# Patient Record
Sex: Female | Born: 1995
Health system: Southern US, Community
[De-identification: ages and names within clinical notes are randomized; demographics above are authoritative.]

## PROBLEM LIST (undated history)

## (undated) DIAGNOSIS — J45909 Unspecified asthma, uncomplicated: Secondary | ICD-10-CM

## (undated) DIAGNOSIS — G43109 Migraine with aura, not intractable, without status migrainosus: Secondary | ICD-10-CM

## (undated) DIAGNOSIS — R569 Unspecified convulsions: Secondary | ICD-10-CM

## (undated) HISTORY — DX: Migraine with aura, not intractable, without status migrainosus: G43.109

## (undated) HISTORY — PX: NO PAST SURGERIES: SHX2092

## (undated) HISTORY — DX: Unspecified convulsions: R56.9

---
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
## 2014-08-14 ENCOUNTER — Encounter: Payer: Self-pay | Admitting: Advanced Practice Midwife

## 2014-08-14 ENCOUNTER — Ambulatory Visit (INDEPENDENT_AMBULATORY_CARE_PROVIDER_SITE_OTHER): Payer: Medicaid Other | Admitting: Advanced Practice Midwife

## 2014-08-14 VITALS — BP 140/76 | Ht 64.0 in | Wt 169.0 lb

## 2014-08-14 DIAGNOSIS — Z3202 Encounter for pregnancy test, result negative: Secondary | ICD-10-CM

## 2014-08-14 DIAGNOSIS — Z3046 Encounter for surveillance of implantable subdermal contraceptive: Secondary | ICD-10-CM

## 2014-08-14 DIAGNOSIS — Z30017 Encounter for initial prescription of implantable subdermal contraceptive: Secondary | ICD-10-CM

## 2014-08-14 DIAGNOSIS — G43109 Migraine with aura, not intractable, without status migrainosus: Secondary | ICD-10-CM | POA: Insufficient documentation

## 2014-08-14 LAB — POCT URINE PREGNANCY: Preg Test, Ur: NEGATIVE

## 2014-08-14 NOTE — Progress Notes (Signed)
Patricia Horton 18 y.o. Filed Vitals:   08/14/14 1526  BP: 140/76    Past Medical History  Diagnosis Date  . Seizures   . Migraine with aura 2014    NO ESTROGEN    Family History  Problem Relation Age of Onset  . Heart disease Paternal Aunt   . Cancer Paternal Grandmother     breast  . Heart disease Paternal Grandmother   . Diabetes Mother   . Stroke Mother   . Hypertension Mother     History   Social History  . Marital Status: Single    Spouse Name: N/A    Number of Children: N/A  . Years of Education: N/A   Occupational History  . Not on file.   Social History Main Topics  . Smoking status: Never Smoker   . Smokeless tobacco: Never Used  . Alcohol Use: No  . Drug Use: No  . Sexual Activity: Yes    Birth Control/ Protection: Implant   Other Topics Concern  . Not on file   Social History Narrative  . No narrative on file    HPI:  Patricia Horton is here for Nexplanon removal and reinsertion.  She was given informed consent for removal of her Nexplanon and reinsertion of a new one.A signed copy is in the chart.  Appropriate time out taken. Nexlanon site identified and thea area was prepped in usual sterile fashon. 2 cc of 1% lidocaine was used to anesthetize the area starting with the distal end of the implant. A small stab incision was made right beside the implant on the distal portion.  The Nexplanon rod was grasped using hemostats and removed without difficulty.  There was less than 3 cc blood loss. There were no complications. Next, the area was cleansed again and the new Nexplanon was inserted without difficulty.  Steri strips and a pressure bandage were applied.  Pt was instructed to remove pressure bandage in a few hours, and keep insertion site covered with a bandaid for 3 days.  Follow-up scheduled PRN problems  CRESENZO-DISHMAN,Patricia Horton 08/14/2014 3:56 PM

## 2014-09-17 ENCOUNTER — Encounter (HOSPITAL_COMMUNITY): Payer: Self-pay | Admitting: Emergency Medicine

## 2014-09-17 ENCOUNTER — Emergency Department (HOSPITAL_COMMUNITY)
Admission: EM | Admit: 2014-09-17 | Discharge: 2014-09-17 | Disposition: A | Discharge status: Home / Self Care | Payer: No Typology Code available for payment source | Attending: Emergency Medicine | Admitting: Emergency Medicine

## 2014-09-17 DIAGNOSIS — Y9389 Activity, other specified: Secondary | ICD-10-CM | POA: Insufficient documentation

## 2014-09-17 DIAGNOSIS — Y92410 Unspecified street and highway as the place of occurrence of the external cause: Secondary | ICD-10-CM | POA: Insufficient documentation

## 2014-09-17 DIAGNOSIS — Z8679 Personal history of other diseases of the circulatory system: Secondary | ICD-10-CM | POA: Insufficient documentation

## 2014-09-17 DIAGNOSIS — S0090XA Unspecified superficial injury of unspecified part of head, initial encounter: Secondary | ICD-10-CM | POA: Insufficient documentation

## 2014-09-17 DIAGNOSIS — Z79899 Other long term (current) drug therapy: Secondary | ICD-10-CM | POA: Insufficient documentation

## 2014-09-17 DIAGNOSIS — S199XXA Unspecified injury of neck, initial encounter: Secondary | ICD-10-CM | POA: Insufficient documentation

## 2014-09-17 DIAGNOSIS — S3992XA Unspecified injury of lower back, initial encounter: Secondary | ICD-10-CM | POA: Diagnosis present

## 2014-09-17 DIAGNOSIS — R51 Headache: Secondary | ICD-10-CM

## 2014-09-17 DIAGNOSIS — S0993XA Unspecified injury of face, initial encounter: Secondary | ICD-10-CM | POA: Diagnosis not present

## 2014-09-17 DIAGNOSIS — S39012A Strain of muscle, fascia and tendon of lower back, initial encounter: Secondary | ICD-10-CM | POA: Insufficient documentation

## 2014-09-17 DIAGNOSIS — Z8669 Personal history of other diseases of the nervous system and sense organs: Secondary | ICD-10-CM | POA: Insufficient documentation

## 2014-09-17 DIAGNOSIS — R519 Headache, unspecified: Secondary | ICD-10-CM

## 2014-09-17 MED ORDER — CYCLOBENZAPRINE HCL 5 MG PO TABS: 5.0000 mg | ORAL_TABLET | Freq: Three times a day (TID) | ORAL | Status: DC | PRN

## 2014-09-17 MED ORDER — CYCLOBENZAPRINE HCL 10 MG PO TABS
10.0000 mg | ORAL_TABLET | Freq: Once | ORAL | Status: AC
  Administered 2014-09-17: 10 mg via ORAL
  Filled 2014-09-17: qty 1
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
## 2014-09-17 NOTE — Discharge Instructions (Signed)
Take the medication as directed. Do not take it if you are driving as it will make you sleepy. Apply ice to the areas, rest, return for worsening symptoms. Continue to take your Aleve.
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
## 2014-09-20 NOTE — ED Provider Notes (Signed)
Medical screening examination/treatment/procedure(s) were performed by non-physician practitioner and as supervising physician I was immediately available for consultation/collaboration.     Haven Foss, MD 09/20/14 0704 

## 2014-10-08 ENCOUNTER — Emergency Department (HOSPITAL_COMMUNITY)
Admission: EM | Admit: 2014-10-08 | Discharge: 2014-10-08 | Disposition: A | Discharge status: Home / Self Care | Payer: No Typology Code available for payment source | Attending: Emergency Medicine | Admitting: Emergency Medicine

## 2014-10-08 ENCOUNTER — Emergency Department (HOSPITAL_COMMUNITY): Payer: No Typology Code available for payment source

## 2014-10-08 ENCOUNTER — Encounter (HOSPITAL_COMMUNITY): Payer: Self-pay | Admitting: Emergency Medicine

## 2014-10-08 DIAGNOSIS — S3992XA Unspecified injury of lower back, initial encounter: Secondary | ICD-10-CM | POA: Insufficient documentation

## 2014-10-08 DIAGNOSIS — G479 Sleep disorder, unspecified: Secondary | ICD-10-CM | POA: Insufficient documentation

## 2014-10-08 DIAGNOSIS — Y9389 Activity, other specified: Secondary | ICD-10-CM | POA: Diagnosis not present

## 2014-10-08 DIAGNOSIS — Y998 Other external cause status: Secondary | ICD-10-CM | POA: Diagnosis not present

## 2014-10-08 DIAGNOSIS — Y9289 Other specified places as the place of occurrence of the external cause: Secondary | ICD-10-CM | POA: Diagnosis not present

## 2014-10-08 DIAGNOSIS — R200 Anesthesia of skin: Secondary | ICD-10-CM | POA: Diagnosis not present

## 2014-10-08 DIAGNOSIS — Y92410 Unspecified street and highway as the place of occurrence of the external cause: Secondary | ICD-10-CM | POA: Insufficient documentation

## 2014-10-08 DIAGNOSIS — Z8679 Personal history of other diseases of the circulatory system: Secondary | ICD-10-CM | POA: Insufficient documentation

## 2014-10-08 DIAGNOSIS — M545 Low back pain: Secondary | ICD-10-CM

## 2014-10-08 DIAGNOSIS — Z79899 Other long term (current) drug therapy: Secondary | ICD-10-CM | POA: Diagnosis not present

## 2014-10-08 DIAGNOSIS — Z7952 Long term (current) use of systemic steroids: Secondary | ICD-10-CM | POA: Insufficient documentation

## 2014-10-08 DIAGNOSIS — S8992XA Unspecified injury of left lower leg, initial encounter: Secondary | ICD-10-CM | POA: Insufficient documentation

## 2014-10-08 MED ORDER — PREDNISONE 10 MG PO TABS: ORAL_TABLET | ORAL | Status: DC

## 2014-10-08 MED ORDER — HYDROCODONE-ACETAMINOPHEN 5-325 MG PO TABS: 1.0000 | ORAL_TABLET | ORAL | Status: DC | PRN

## 2014-10-08 MED ORDER — PREDNISONE 50 MG PO TABS
60.0000 mg | ORAL_TABLET | Freq: Once | ORAL | Status: AC
  Administered 2014-10-08: 15:00:00 60 mg via ORAL
  Filled 2014-10-08 (×2): qty 1

## 2014-10-08 NOTE — Discharge Instructions (Signed)
Back Pain, Adult °Low back pain is very common. About 1 in 5 people have back pain. The cause of low back pain is rarely dangerous. The pain often gets better over time. About half of people with a sudden onset of back pain feel better in just 2 weeks. About 8 in 10 people feel better by 6 weeks.  °CAUSES °Some common causes of back pain include: °· Strain of the muscles or ligaments supporting the spine. °· Wear and tear (degeneration) of the spinal discs. °· Arthritis. °· Direct injury to the back. °DIAGNOSIS °Most of the time, the direct cause of low back pain is not known. However, back pain can be treated effectively even when the exact cause of the pain is unknown. Answering your caregiver's questions about your overall health and symptoms is one of the most accurate ways to make sure the cause of your pain is not dangerous. If your caregiver needs more information, he or she may order lab work or imaging tests (X-rays or MRIs). However, even if imaging tests show changes in your back, this usually does not require surgery. °HOME CARE INSTRUCTIONS °For many people, back pain returns. Since low back pain is rarely dangerous, it is often a condition that people can learn to manage on their own.  °· Remain active. It is stressful on the back to sit or stand in one place. Do not sit, drive, or stand in one place for more than 30 minutes at a time. Take short walks on level surfaces as soon as pain allows. Try to increase the length of time you walk each day. °· Do not stay in bed. Resting more than 1 or 2 days can delay your recovery. °· Do not avoid exercise or work. Your body is made to move. It is not dangerous to be active, even though your back may hurt. Your back will likely heal faster if you return to being active before your pain is gone. °· Pay attention to your body when you  bend and lift. Many people have less discomfort when lifting if they bend their knees, keep the load close to their bodies, and  avoid twisting. Often, the most comfortable positions are those that put less stress on your recovering back. °· Find a comfortable position to sleep. Use a firm mattress and lie on your side with your knees slightly bent. If you lie on your back, put a pillow under your knees. °· Only take over-the-counter or prescription medicines as directed by your caregiver. Over-the-counter medicines to reduce pain and inflammation are often the most helpful. Your caregiver may prescribe muscle relaxant drugs. These medicines help dull your pain so you can more quickly return to your normal activities and healthy exercise. °· Put ice on the injured area. °¨ Put ice in a plastic bag. °¨ Place a towel between your skin and the bag. °¨ Leave the ice on for 15-20 minutes, 03-04 times a day for the first 2 to 3 days. After that, ice and heat may be alternated to reduce pain and spasms. °· Ask your caregiver about trying back exercises and gentle massage. This may be of some benefit. °· Avoid feeling anxious or stressed. Stress increases muscle tension and can worsen back pain. It is important to recognize when you are anxious or stressed and learn ways to manage it. Exercise is a great option. °SEEK MEDICAL CARE IF: °· You have pain that is not relieved with rest or medicine. °· You have pain that does not improve in 1 week. °· You have new symptoms. °· You are generally not feeling well. °SEEK   IMMEDIATE MEDICAL CARE IF:   You have pain that radiates from your back into your legs.  You develop new bowel or bladder control problems.  You have unusual weakness or numbness in your arms or legs.  You develop nausea or vomiting.  You develop abdominal pain.  You feel faint. Document Released: 11/16/2005 Document Revised: 05/17/2012 Document Reviewed: 03/20/2014 [facility] Patient Information 2015 [place], [place]. This information is not intended to replace advice given to you by your health care provider. Make sure you  discuss any questions you have with your health care provider.   Emergency Department Resource Guide 1) Find a Doctor and Pay Out of Pocket Although you won't have to find out who is covered by your insurance plan, it is a good idea to ask around and get recommendations. You will then need to call the office and see if the doctor you have chosen will accept you as a new patient and what types of options they offer for patients who are self-pay. Some doctors offer discounts or will set up payment plans for their patients who do not have insurance, but you will need to ask so you aren't surprised when you get to your appointment.  2) Contact Your Local Health Department Not all health departments have doctors that can see patients for sick visits, but many do, so it is worth a call to see if yours does. If you don't know where your local health department is, you can check in your phone book. The CDC also has a tool to help you locate your state's health department, and many state websites also have listings of all of their local health departments.  3) Find a [facility] If your illness is not likely to be very severe or complicated, you may want to try a walk in clinic. These are popping up all over the country in pharmacies, drugstores, and shopping centers. They're usually staffed by nurse practitioners or physician assistants that have been trained to treat common illnesses and complaints. They're usually fairly quick and inexpensive. However, if you have serious medical issues or chronic medical problems, these are probably not your best option.  No Primary Care Doctor: - Call Health Connect at  [phone] - they can help you locate a primary care doctor that  accepts your insurance, provides certain services, etc. - Physician Referral Service- [phone]  Chronic Pain Problems: Organization         Address  Phone   Notes  [facility]  [phone] Patients need to  be referred by their primary care doctor.   Medication Assistance: Organization         Address  Phone   Notes  [facility] Medication [facility] [address] [place], [place] [address] [phone] --Must be a resident of [facility] -- Must have NO insurance coverage whatsoever (no Medicaid/ Medicare, etc.) -- The pt. MUST have a primary care doctor that directs their care regularly and follows them in the community   MedAssist  [phone]   [employer]  [phone]    Agencies that provide inexpensive medical care: Organization         Address  Phone   Notes  [place]  [phone]   [facility]    [phone]   [facility] [place], [place] [address] [phone]   [place] [address]  [place] [phone]   Planned Parenthood    [phone]   [facility]    [phone]   Community Health and [place] [address], [place] Phone:  [phone], Fax:  [phone] Hours of Operation:  9 am - 6 pm, M-F.  Also accepts Medicaid/Medicare and self-pay.  [facility] for [address], [address], [place] Phone: [phone], Fax: [phone]. Hours of Operation:  8:30 am - 5:30 pm, M-F.  Also accepts Medicaid and self-pay.  [facility] [place] [address], [place] Phone: [phone]   [place], [place], [place] [phone], Ext. [phone] Mondays & Thursdays: 7-9 AM.  First 15 patients are seen on a first come, first serve basis.    [place] Providers:  Organization         Address  Phone   Notes  [facility] [address], [place] [phone] Also accepts self-pay patients.  [facility] [address], [place]  [phone]   [place], [address], [place] [phone]   [facility] [address] [place] [phone]   Lucianne Lei [address], [place]   [phone] Only accepts [place] Access [place] patients after they have their name applied to their card.   Self-Pay (no insurance) in [facility]:  Organization         Address  Phone   Notes  Sickle Cell Patients, [facility] [place] [phone]   [facility] [place] [phone]   [facility] [place]  [place], [place], [place] [phone]   [facility]/Dr. Osei-Bonsu  [address], [place] or [address], [place] [phone] Phone number for both [place] and [place] locations is the same.  [facility] [address], [place] [phone]   [facility] [address] [place] or [address] [phone]   [facility] [address] [place] [phone], phone; [phone], fax Sees patients 1st and 3rd Saturday of every month.  Must not qualify for public or private insurance (i.e. Medicaid, Medicare, [place] Health Choice, Veterans' Benefits)  Household income should be no more than 200% of the poverty level The clinic cannot treat you if you are pregnant or think you are pregnant  Sexually transmitted diseases are not treated at the clinic.    Dental Care: Organization         Address  Phone  Notes  [facility] Department of [facility] [place] [phone] Accepts children up to age 110 who are enrolled in [place] or [place]; pregnant women with a Medicaid card; and children who have applied for Medicaid or [place] Health Choice, but were declined, whose parents can  pay a reduced fee at time of service.  [facility]  [address], [place] [phone] Accepts children up to age 35 who are enrolled in [place] or [place]; pregnant women with a Medicaid card; and children who have applied for Medicaid or [place]  Health Choice, but were declined, whose parents can pay a reduced fee at time of service.  [place] Adult Dental Access PROGRAM  [place] [phone] Patients are seen by appointment only. Walk-ins are not accepted. [place] will see patients 54 years of age and older. Monday - Tuesday (8am-5pm) Most Wednesdays (8:30-5pm) $30 per visit, cash only  [facility] Adult Dental Access PROGRAM  [address], [facility] [phone] Patients are seen by appointment only. Walk-ins are not accepted. [place] will see patients 74 years of age and older. One Wednesday Evening (Monthly: Volunteer Based).  $30 per visit, cash only  [place]  [phone] for adults; Children under age 11, call Graduate Pediatric Dentistry at [phone]. Children aged 13-14, please call [phone] to request a pediatric application.  Dental services are provided in all areas of dental care including fillings, crowns and bridges, complete and partial dentures, implants, gum treatment, root canals, and extractions. Preventive care is also provided. Treatment is provided to both adults and children. Patients are selected via a lottery and there is often a waiting list.   [place] [address], [place]  [phone] [email or web address]   [facility] [address] [place], [place] [phone], Ext. [phone] Second and Fourth Thursday of each month, opens at 6:30 AM; Clinic ends at 9 AM.  Patients are seen on a first-come first-served basis, and a limited number are seen during each clinic.   [facility]  [address] Hillard Danker [place], [place] [phone]   Eligibility Requirements You must have lived in [place], [place], or [place] counties for at least the last three months.   You cannot be eligible for state or federal sponsored [employer], including [employer], [place], or [employer].   You generally cannot be eligible for healthcare insurance through your employer.    How to apply: Eligibility screenings are held every Tuesday and Wednesday afternoon from 1:00 pm until 4:00 pm. You do not need an appointment for the interview!  [facility] [address], [place], [place]   [place]  [place] Department  [place]  [phone]    Behavioral Health Resources in the Community: Intensive Outpatient Programs Organization         Address  Phone  Notes  [facility] [place] [place]. [address] [place], [place] [phone]   [facility] Outpatient [address] [place], [place]   ADS: Alcohol & Drug Svcs [address], [place], [place]   [place] [address]  [place], [place] or [phone]   Substance Abuse Resources Organization         Address  Phone  Notes  Alcohol [facility] Drug Services  [phone]   [place]  [phone]   [facility] [place]   Chinita Pester  [phone]   Residential & Outpatient Substance Abuse Program  [phone]   Psychological Services Organization         Address  Phone  Notes  [facility] [place]  [place]  [phone]   [place] [address] [place] or [phone]    Mobile Crisis Teams Organization         Address  Phone  Notes  [facility], Mobile Crisis Care Unit  [phone]    [facility]  [address] [place], [place] [phone]   [facility] [address] [place] [phone]    Self-Help/Support Groups Organization         Address  Phone             Notes  [facility] of [place] - variety of support groups  [phone]- [phone] Call for more information  Narcotics Anonymous (NA), [facility] [address], [place]  2 meetings at this location   Statisticianesidential Treatment Programs Organization         Address  Phone  Notes  [facility] [address],    [place]  [phone]   [facility]  [address], [place] [phone], [place], [place] [phone]   [facility] [address], [place] [phone] Admissions: 8am-3pm M-F  [facility] [address]    [place], [place] [phone]   [facility] [address], [place], [place] [phone]   [facility] [address]  [place], [place] [phone]   [facility] - Intensive Outpatient [address], [place], [place] [phone]   [facility] ([facility] [address]  [place], [place] [phone] or [phone]   [facility] (RTS) [address] [place], [place] [phone] Accepts Medicaid  [facility] [address]  [place] [phone] Substance Abuse/Addiction Treatment   [facility] Behavioral Health Resources Organization         Address  Phone  Notes  [facility]  [phone](888) [phone]   Angie FavaJulie Brannon, PhD [address], [address] [place], [place]   [phone](336) [phone] or [phone]   [facility]   [address] [place], [place] [phone]   [facility] [address], [place], [place] [phone]) [phone] Insurance/Medicaid/sponsorship through [facility]  [facility] [address]                                     [place], [place] [phone]) [phone] Therapy/tele-psych/case  [facility] [address].   [place], [place] [phone]) [phone]    Dr. Lolly MustacheArfeen  [phone]   [facility] [place]  [employer] [facility] 1) [address], [place] 2) [address], [place] 3)  [address], [place] [phone]) [phone]  [phone]([phone]   [facility] Child Abuse Hotline [phone](336) [phone] or [phone](336) 7782150142 (After Hours)        Take your next dose of prednisone tomorrow morning.  Use the the other medicines as directed.  Do not drive within 4 hours of taking oxycodone as this will make you drowsy.  Avoid lifting,  Bending,  Twisting or any other activity that worsens your pain over the next week.  Apply an  icepack  to your lower back for 10-15 minutes every 2 hours for the next 2 days.  You should get rechecked if your symptoms are not better over the next 5 days,  Or you develop increased pain,  Weakness in your leg(s) or loss of bladder or bowel function - these are symptoms of a worse injury.
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
## 2015-05-13 ENCOUNTER — Emergency Department (HOSPITAL_COMMUNITY)
Admission: EM | Admit: 2015-05-13 | Discharge: 2015-05-13 | Disposition: A | Discharge status: Home / Self Care | Payer: Self-pay | Attending: Emergency Medicine | Admitting: Emergency Medicine

## 2015-05-13 ENCOUNTER — Encounter (HOSPITAL_COMMUNITY): Payer: Self-pay | Admitting: *Deleted

## 2015-05-13 DIAGNOSIS — Z3202 Encounter for pregnancy test, result negative: Secondary | ICD-10-CM | POA: Insufficient documentation

## 2015-05-13 DIAGNOSIS — N760 Acute vaginitis: Secondary | ICD-10-CM | POA: Insufficient documentation

## 2015-05-13 DIAGNOSIS — Z79899 Other long term (current) drug therapy: Secondary | ICD-10-CM | POA: Insufficient documentation

## 2015-05-13 DIAGNOSIS — G43909 Migraine, unspecified, not intractable, without status migrainosus: Secondary | ICD-10-CM | POA: Insufficient documentation

## 2015-05-13 DIAGNOSIS — B9689 Other specified bacterial agents as the cause of diseases classified elsewhere: Secondary | ICD-10-CM

## 2015-05-13 DIAGNOSIS — N730 Acute parametritis and pelvic cellulitis: Secondary | ICD-10-CM | POA: Insufficient documentation

## 2015-05-13 LAB — PREGNANCY, URINE: PREG TEST UR: NEGATIVE

## 2015-05-13 LAB — URINE MICROSCOPIC-ADD ON

## 2015-05-13 LAB — WET PREP, GENITAL
Trich, Wet Prep: NONE SEEN
YEAST WET PREP: NONE SEEN

## 2015-05-13 LAB — URINALYSIS, ROUTINE W REFLEX MICROSCOPIC
Bilirubin Urine: NEGATIVE
Glucose, UA: NEGATIVE mg/dL
Hgb urine dipstick: NEGATIVE
KETONES UR: NEGATIVE mg/dL
Nitrite: NEGATIVE
PROTEIN: NEGATIVE mg/dL
Specific Gravity, Urine: 1.025 (ref 1.005–1.030)
UROBILINOGEN UA: 0.2 mg/dL (ref 0.0–1.0)
pH: 6.5 (ref 5.0–8.0)

## 2015-05-13 MED ORDER — IBUPROFEN 800 MG PO TABS
800.0000 mg | ORAL_TABLET | Freq: Once | ORAL | Status: AC
  Administered 2015-05-13: 800 mg via ORAL
  Filled 2015-05-13: qty 1

## 2015-05-13 MED ORDER — CEFTRIAXONE SODIUM 250 MG IJ SOLR
250.0000 mg | Freq: Once | INTRAMUSCULAR | Status: AC
  Administered 2015-05-13: 250 mg via INTRAMUSCULAR
  Filled 2015-05-13: qty 250

## 2015-05-13 MED ORDER — IBUPROFEN 800 MG PO TABS: 800.0000 mg | ORAL_TABLET | Freq: Three times a day (TID) | ORAL | Status: DC | PRN

## 2015-05-13 MED ORDER — LIDOCAINE HCL (PF) 1 % IJ SOLN
INTRAMUSCULAR | Status: AC
  Administered 2015-05-13: 2.1 mL
  Filled 2015-05-13: qty 5

## 2015-05-13 MED ORDER — METRONIDAZOLE 500 MG PO TABS: 500.0000 mg | ORAL_TABLET | Freq: Two times a day (BID) | ORAL | Status: DC

## 2015-05-13 MED ORDER — DOXYCYCLINE HYCLATE 100 MG PO CAPS: 100.0000 mg | ORAL_CAPSULE | Freq: Two times a day (BID) | ORAL | Status: DC

## 2015-05-13 MED ORDER — DOXYCYCLINE HYCLATE 100 MG PO TABS
100.0000 mg | ORAL_TABLET | Freq: Once | ORAL | Status: AC
  Administered 2015-05-13: 100 mg via ORAL
  Filled 2015-05-13: qty 1

## 2015-05-13 MED ORDER — METRONIDAZOLE 500 MG PO TABS
500.0000 mg | ORAL_TABLET | Freq: Once | ORAL | Status: AC
  Administered 2015-05-13: 500 mg via ORAL
  Filled 2015-05-13: qty 1

## 2015-05-13 NOTE — ED Provider Notes (Signed)
TIME SEEN: 7:45 PM  CHIEF COMPLAINT: Lower abdominal pain, vaginal discharge  HPI: Pt is a 19 y.o. female with history of migraines, prior history of seizures on antiepileptics who presents to the emergency department with complaints of 3 days of lower abdominal pain that is crampy, moderate in nature without radiation. Pain worse with movement and better with staying still. Denies fevers, chills, nausea, vomiting or diarrhea. Has noted yellow thick vaginal discharge without odor. His sexual active with one female partner. Denies history of previous pregnancies or STDs. Has had an Implanon placed in August 2015. States that over the past 2 months she has had periods that have been lasting 1-2 weeks and have been more painful than normal. She states her last menstrual period ended yesterday and lasted one week.  ROS: See HPI Constitutional: no fever  Eyes: no drainage  ENT: no runny nose   Cardiovascular:  no chest pain  Resp: no SOB  GI: no vomiting GU: no dysuria Integumentary: no rash  Allergy: no hives  Musculoskeletal: no leg swelling  Neurological: no slurred speech ROS otherwise negative  PAST MEDICAL HISTORY/PAST SURGICAL HISTORY:  Past Medical History  Diagnosis Date  . Seizures   . Migraine with aura 2014    NO ESTROGEN    MEDICATIONS:  Prior to Admission medications   Medication Sig Start Date End Date Taking? Authorizing Provider  cyclobenzaprine (FLEXERIL) 5 MG tablet Take 1 tablet (5 mg total) by mouth 3 (three) times daily as needed for muscle spasms. 09/17/14   Hope Orlene Och, NP  etonogestrel (NEXPLANON) 68 MG IMPL implant Inject 1 each into the skin once.    Historical Provider, MD  HYDROcodone-acetaminophen (NORCO/VICODIN) 5-325 MG per tablet Take 1 tablet by mouth every 4 (four) hours as needed. 10/08/14   Burgess Amor, PA-C  ondansetron (ZOFRAN-ODT) 4 MG disintegrating tablet Take 1 tablet (4 mg total) by mouth every 8 (eight) hours as needed for nausea. 12/22/13   Felicie Morn, NP  predniSONE (DELTASONE) 10 MG tablet 6, 5, 4, 3, 2 then 1 tablet by mouth daily for 6 days total. 10/08/14   Burgess Amor, PA-C    ALLERGIES:  No Known Allergies  SOCIAL HISTORY:  History  Substance Use Topics  . Smoking status: Never Smoker   . Smokeless tobacco: Never Used  . Alcohol Use: No    FAMILY HISTORY: Family History  Problem Relation Age of Onset  . Heart disease Paternal Aunt   . Cancer Paternal Grandmother     breast  . Heart disease Paternal Grandmother   . Diabetes Mother   . Stroke Mother   . Hypertension Mother     EXAM: BP 124/58 mmHg  Pulse 71  Temp(Src) 98.5 F (36.9 C) (Oral)  Resp 24  Ht 5\' 4"  (1.626 m)  Wt 157 lb 8 oz (71.442 kg)  BMI 27.02 kg/m2  SpO2 100%  LMP 05/05/2015 CONSTITUTIONAL: Alert and oriented and responds appropriately to questions. Well-appearing; well-nourished HEAD: Normocephalic EYES: Conjunctivae clear, PERRL ENT: normal nose; no rhinorrhea; moist mucous membranes; pharynx without lesions noted NECK: Supple, no meningismus, no LAD  CARD: RRR; S1 and S2 appreciated; no murmurs, no clicks, no rubs, no gallops RESP: Normal chest excursion without splinting or tachypnea; breath sounds clear and equal bilaterally; no wheezes, no rhonchi, no rales, no hypoxia or respiratory distress, speaking full sentences ABD/GI: Normal bowel sounds; non-distended; soft, tender throughout the lower abdomen, no rebound, no guarding, no peritoneal signs, no tenderness at McBurney's point  GU:  Patient has a large amount of thick yellow vaginal discharge coming from the cervical os with a friable cervix, she has cervical motion tenderness but no adnexal tenderness or fullness, normal external genitalia, no vaginal bleeding BACK:  The back appears normal and is non-tender to palpation, there is no CVA tenderness EXT: Normal ROM in all joints; non-tender to palpation; no edema; normal capillary refill; no cyanosis, no calf tenderness or swelling     SKIN: Normal color for age and race; warm NEURO: Moves all extremities equally, sensation to light touch intact diffusely, cranial nerves II through XII intact PSYCH: The patient's mood and manner are appropriate. Grooming and personal hygiene are appropriate.  MEDICAL DECISION MAKING: Patient here with cervical motion tenderness and a large amount of thick yellow vaginal discharge. Concern for PID. Urine shows no obvious sign of infection in urine pregnancy is negative. STD screening is pending. Will discharge on doxycycline. She received a dose of ceftriaxone in the ED.  ED PROGRESS: Patient's wet prep is positive for clue cells and WBCs.  Will discharge on Flagyl and with doxycycline. Recommended close outpatient follow-up with her OB/GYN. Discussed return precautions. She verbalizes understanding and is comfortable with plan. STD screening pending.     Layla Maw Ward, DO 05/13/15 2050

## 2015-05-13 NOTE — ED Notes (Signed)
Patient with no complaints at this time. Respirations even and unlabored. Skin warm/dry. Discharge instructions reviewed with patient at this time. Patient given opportunity to voice concerns/ask questions. Patient discharged at this time and left Emergency Department with steady gait.   

## 2015-05-13 NOTE — ED Notes (Signed)
Patient reports pelvic pain and yellow vaginal discharge.  Denies dysuria, burning or stinging w/urination.

## 2015-05-13 NOTE — Discharge Instructions (Signed)
Pelvic Inflammatory Disease °Pelvic inflammatory disease (PID) refers to an infection in some or all of the female organs. The infection can be in the uterus, ovaries, fallopian tubes, or the surrounding tissues in the pelvis. PID can cause abdominal or pelvic pain that comes on suddenly (acute pelvic pain). PID is a serious infection because it can lead to lasting (chronic) pelvic pain or the inability to have children (infertile).  °CAUSES  °The infection is often caused by the normal bacteria found in the vaginal tissues. PID may also be caused by an infection that is spread during sexual contact. PID can also occur following:  °· The birth of a baby.   °· A miscarriage.   °· An abortion.   °· Major pelvic surgery.   °· The use of an intrauterine device (IUD).   °· A sexual assault.   °RISK FACTORS °Certain factors can put a person at higher risk for PID, such as: °· Being younger than 25 years. °· Being sexually active at a young age. °· Using nonbarrier contraception. °· Having multiple sexual partners. °· Having sex with someone who has symptoms of a genital infection. °· Using oral contraception. °Other times, certain behaviors can increase the possibility of getting PID, such as: °· Having sex during your period. °· Using a vaginal douche. °· Having an intrauterine device (IUD) in place. °SYMPTOMS  °· Abdominal or pelvic pain.   °· Fever.   °· Chills.   °· Abnormal vaginal discharge. °· Abnormal uterine bleeding.   °· Unusual pain shortly after finishing your period. °DIAGNOSIS  °Your caregiver will choose some of the following methods to make a diagnosis, such as:  °· Performing a physical exam and history. A pelvic exam typically reveals a very tender uterus and surrounding pelvis.   °· Ordering laboratory tests including a pregnancy test, blood tests, and urine test.  °· Ordering cultures of the vagina and cervix to check for a sexually transmitted infection (STI). °· Performing an ultrasound.    °· Performing a laparoscopic procedure to look inside the pelvis.   °TREATMENT  °· Antibiotic medicines may be prescribed and taken by mouth.   °· Sexual partners may be treated when the infection is caused by a sexually transmitted disease (STD).   °· Hospitalization may be needed to give antibiotics intravenously. °· Surgery may be needed, but this is rare. °It may take weeks until you are completely well. If you are diagnosed with PID, you should also be checked for human immunodeficiency virus (HIV).   °HOME CARE INSTRUCTIONS  °· If given, take your antibiotics as directed. Finish the medicine even if you start to feel better.   °· Only take over-the-counter or prescription medicines for pain, discomfort, or fever as directed by your caregiver.   °· Do not have sexual intercourse until treatment is completed or as directed by your caregiver. If PID is confirmed, your recent sexual partner(s) will need treatment.   °· Keep your follow-up appointments. °SEEK MEDICAL CARE IF:  °· You have increased or abnormal vaginal discharge.   °· You need prescription medicine for your pain.   °· You vomit.   °· You cannot take your medicines.   °· Your partner has an STD.   °SEEK IMMEDIATE MEDICAL CARE IF:  °· You have a fever.   °· You have increased abdominal or pelvic pain.   °· You have chills.   °· You have pain when you urinate.   °· You are not better after 72 hours following treatment.   °MAKE SURE YOU:  °· Understand these instructions. °· Will watch your condition. °· Will get help right away if you are not doing well or get worse. °  Document Released: 11/16/2005 Document Revised: 03/13/2013 Document Reviewed: 11/12/2011 °[employer]® Patient Information ©2015 [employer]. This information is not intended to replace advice given to you by your health care provider. Make sure you discuss any questions you have with your health care provider. ° °

## 2015-05-13 NOTE — ED Notes (Signed)
Pt c/o abdominal pain that has gotten progressively worse over the last 3 days; pt denies any n/v/d and states her last BM was this am

## 2015-05-15 LAB — GC/CHLAMYDIA PROBE AMP (~~LOC~~) NOT AT ARMC
Chlamydia: POSITIVE — AB
Neisseria Gonorrhea: POSITIVE — AB

## 2015-05-15 LAB — HIV ANTIBODY (ROUTINE TESTING W REFLEX): HIV SCREEN 4TH GENERATION: NONREACTIVE

## 2015-05-15 LAB — RPR: RPR: NONREACTIVE

## 2015-05-16 ENCOUNTER — Telehealth (HOSPITAL_BASED_OUTPATIENT_CLINIC_OR_DEPARTMENT_OTHER): Payer: Self-pay | Admitting: Emergency Medicine

## 2015-05-17 ENCOUNTER — Telehealth (HOSPITAL_BASED_OUTPATIENT_CLINIC_OR_DEPARTMENT_OTHER): Payer: Self-pay | Admitting: Emergency Medicine

## 2015-05-18 ENCOUNTER — Telehealth: Payer: Self-pay | Admitting: Emergency Medicine

## 2015-05-19 ENCOUNTER — Telehealth (HOSPITAL_BASED_OUTPATIENT_CLINIC_OR_DEPARTMENT_OTHER): Payer: Self-pay | Admitting: Emergency Medicine

## 2015-05-19 NOTE — Telephone Encounter (Signed)
Unable to contact patient by phone regarding lab result. Letter sent.

## 2015-05-22 ENCOUNTER — Telehealth (HOSPITAL_BASED_OUTPATIENT_CLINIC_OR_DEPARTMENT_OTHER): Payer: Self-pay | Admitting: Emergency Medicine

## 2015-11-14 ENCOUNTER — Telehealth: Payer: Self-pay | Admitting: Adult Health

## 2015-11-14 NOTE — Telephone Encounter (Signed)
Spoke with pt. Pt wants to make an appt with Drenda FreezeFran to discuss some symptoms she is having and if they are related to Nexplanon. Call transferred to front desk for appt. JSY

## 2015-11-14 NOTE — Telephone Encounter (Signed)
Left message x 1. JSY 

## 2015-11-21 ENCOUNTER — Ambulatory Visit: Payer: Medicaid Other | Admitting: Advanced Practice Midwife

## 2015-11-27 ENCOUNTER — Encounter: Payer: Self-pay | Admitting: Advanced Practice Midwife

## 2015-11-27 ENCOUNTER — Ambulatory Visit (INDEPENDENT_AMBULATORY_CARE_PROVIDER_SITE_OTHER): Payer: BLUE CROSS/BLUE SHIELD | Admitting: Advanced Practice Midwife

## 2015-11-27 VITALS — BP 100/70 | HR 80 | Wt 158.4 lb

## 2015-11-27 DIAGNOSIS — N939 Abnormal uterine and vaginal bleeding, unspecified: Secondary | ICD-10-CM | POA: Diagnosis not present

## 2015-11-27 DIAGNOSIS — Z975 Presence of (intrauterine) contraceptive device: Secondary | ICD-10-CM | POA: Diagnosis not present

## 2015-11-27 DIAGNOSIS — N921 Excessive and frequent menstruation with irregular cycle: Secondary | ICD-10-CM

## 2015-11-27 MED ORDER — MEGESTROL ACETATE 40 MG PO TABS: ORAL_TABLET | ORAL | Status: DC
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
## 2016-03-25 ENCOUNTER — Telehealth: Payer: Self-pay | Admitting: *Deleted

## 2016-03-25 NOTE — Telephone Encounter (Signed)
I called patient back and verified that she was having the same problem that we have seen her before. States that she is having headaches but are more frequent. Dr. Teofilo PodAthar's 1st available was 5/25. Patient requested to be seen sooner. Clinical coordinator advised that it would be ok for patient to see NP for same problem. Patient asked to come in on Friday. I was able to make appt with Patricia Jonesarolyn NP for this Friday. Patient is aware that she will need to bring $60 for copay. I notified patient of cancellation policy, she voiced understanding. I also advised her that if she cannot afford that $60 right away, she may want to start with PCP which is a lower copay.

## 2016-03-27 ENCOUNTER — Telehealth: Payer: Self-pay | Admitting: Nurse Practitioner

## 2016-03-27 ENCOUNTER — Ambulatory Visit (INDEPENDENT_AMBULATORY_CARE_PROVIDER_SITE_OTHER): Payer: BLUE CROSS/BLUE SHIELD | Admitting: Nurse Practitioner

## 2016-03-27 ENCOUNTER — Telehealth: Payer: Self-pay | Admitting: *Deleted

## 2016-03-27 ENCOUNTER — Encounter: Payer: Self-pay | Admitting: Nurse Practitioner

## 2016-03-27 VITALS — BP 115/66 | HR 59 | Ht 64.0 in | Wt 172.6 lb

## 2016-03-27 DIAGNOSIS — G43101 Migraine with aura, not intractable, with status migrainosus: Secondary | ICD-10-CM

## 2016-03-27 MED ORDER — PROMETHAZINE HCL 12.5 MG PO TABS: 12.5000 mg | ORAL_TABLET | Freq: Four times a day (QID) | ORAL | Status: DC | PRN

## 2016-03-27 MED ORDER — AMITRIPTYLINE HCL 10 MG PO TABS: 10.0000 mg | ORAL_TABLET | Freq: Every day | ORAL | Status: DC

## 2016-03-27 NOTE — Telephone Encounter (Signed)
LVM for pt to call back. Derwood KaplanAdvised Carolyn had a cancellation at 11am and wondering if she wanted to come for an 11am appt instead of 1130am. She would have to check in 1045am. Gave [facility] phone number to call us and let us know.

## 2016-03-27 NOTE — Patient Instructions (Signed)
Restart amitriptyline 10 mg every night for 1 week then increase to 2 tablets at night Phenergan when necessary for nausea Given a list of migraine triggers, eliminate one food at a time Keep a record of headaches if they worsen Stop ibuprofen as it may be causing rebound headaches Follow-up in 2 months

## 2016-03-27 NOTE — Telephone Encounter (Signed)
FYI, Patient called 11:15 to advise she is running late for appointment but should be here by 11:30am. [address] Patient advised she was to arrive by 11:15am for 11:30am appointment. Will still be seen up to 10 minutes late, may have to wait.

## 2016-03-27 NOTE — Progress Notes (Addendum)
[facility]  PATIENT: Patricia Horton DOB: 05/10/1996   REASON FOR VISIT: Follow-up for migraine HISTORY FROM: Patient    HISTORY OF PRESENT ILLNESS: Patricia Horton 20 year old returns for follow-up. She was last seen in the office in  2014. When last seen she was having a generalized headache persistent with a throbbing stabbing component she was placed on amitriptyline and once the headache went away the patient stopped the medication she did not have any headaches for about a year but they started again in August and now are becoming more persistent. She is not aware of any migraine triggers She also had a history of seizures in the past when she was 98-52 years old and also was diagnosed with ADHD. She is taking ibuprofen which is not working. MRI of the brain after her last visit was normal. She returns for reevaluation  HISTORY SA9/02/2013 She reports a generalized headache, which is described as persistent and as a throbbing or stabbing sensation. There is associated nausea and no vomiting, and there is photophobia and sonophobia. Her headache (HA) frequency is 7 per week. She estimates that there are 30 HA days/month. She started having HAs some 6-7 months ago, but in the past 2 months, she has had nearly daily HA. On most days, she wakes up with a HA. She had HA onset before the birth control implant. There is family history of migraine in her biological mother.  Treatments tried include OTC NSAIDs, which help intermittently.  The patient denies prior TIA or stroke symptoms, such as sudden onset of one sided weakness, numbness, tingling, slurring of speech or droopy face, hearing loss, tinnitus, diplopia or visual field cut or monocular loss of vision, but reports fatigue with her HAs. She does describe an occasional visual aura: she sees little white specs popping up in her vision. No loss of vision. Of note, the patient denies snoring, and there is no report of witnessed  apneas or choking sensations while asleep.    REVIEW OF SYSTEMS: Full 14 system review of systems performed and notable only for those listed, all others are neg:  Constitutional: neg  Cardiovascular: neg Ear/Nose/Throat: neg  Skin: neg Eyes: neg Respiratory: neg Gastroitestinal: Nausea Hematology/Lymphatic: neg  Endocrine: neg Musculoskeletal:neg Allergy/Immunology: neg Neurological: Migraine headache  Psychiatric: neg Sleep : neg   ALLERGIES: No Known Allergies  HOME MEDICATIONS: Outpatient Prescriptions Prior to Visit  Medication Sig Dispense Refill  . etonogestrel (NEXPLANON) 68 MG IMPL implant Inject 1 each into the skin once.    Marland Kitchen ibuprofen (ADVIL,MOTRIN) 800 MG tablet Take 1 tablet (800 mg total) by mouth every 8 (eight) hours as needed for mild pain. 30 tablet 0  . megestrol (MEGACE) 40 MG tablet Take 3/day (at the same time) for 3 days; 2/day for 2 days, then 1/day PO prn bleeding 60 tablet 3   No facility-administered medications prior to visit.    PAST MEDICAL HISTORY: Past Medical History  Diagnosis Date  . Seizures (HCC)   . Migraine with aura 2014    NO ESTROGEN    PAST SURGICAL HISTORY: History reviewed. No pertinent past surgical history.  FAMILY HISTORY: Family History  Problem Relation Age of Onset  . Heart disease Paternal Aunt   . Cancer Paternal Grandmother     breast  . Heart disease Paternal Grandmother   . Diabetes Mother   . Stroke Mother   . Hypertension Mother     SOCIAL HISTORY: Social History   Social History  .  Marital Status: Single    Spouse Name: N/A  . Number of Children: N/A  . Years of Education: N/A   Occupational History  . Not on file.   Social History Main Topics  . Smoking status: Never Smoker   . Smokeless tobacco: Never Used  . Alcohol Use: No  . Drug Use: No  . Sexual Activity: Yes    Birth Control/ Protection: Implant   Other Topics Concern  . Not on file   Social History Narrative      PHYSICAL EXAM  Filed Vitals:   03/27/16 1137  Height: 5\' 4"  (1.626 m)  Weight: 172 lb 9.6 oz (78.291 kg)   Body mass index is 29.61 kg/(m^2).  Generalized: Well developed, Mildly obese female in no acute distress  Head: normocephalic and atraumatic,. Oropharynx benign  Neck: Supple, no carotid bruits  Cardiac: Regular rate rhythm, no murmur  Musculoskeletal: No deformity   Neurological examination   Mentation: Alert oriented to time, place, history taking. Attention span and concentration appropriate. Recent and remote memory intact.  Follows all commands speech and language fluent.   Cranial nerve II-XII: Fundoscopic exam reveals sharp disc margins.Pupils were equal round reactive to light extraocular movements were full, visual field were full on confrontational test. Facial sensation and strength were normal. hearing was intact to finger rubbing bilaterally. Uvula tongue midline. head turning and shoulder shrug were normal and symmetric.Tongue protrusion into cheek strength was normal. Motor: normal bulk and tone, full strength in the BUE, BLE, fine finger movements normal, no pronator drift. No focal weakness Sensory: normal and symmetric to light touch, pinprick, and  Vibration, proprioception  Coordination: finger-nose-finger, heel-to-shin bilaterally, no dysmetria Reflexes: Brachioradialis 2/2, biceps 2/2, triceps 2/2, patellar 2/2, Achilles 2/2, plantar responses were flexor bilaterally. Gait and Station: Rising up from seated position without assistance, normal stance,  moderate stride, good arm swing, smooth turning, able to perform tiptoe, and heel walking without difficulty. Tandem gait is steady  DIAGNOSTIC DATA (LABS, IMAGING, TESTING) -  ASSESSMENT AND PLAN  20 y.o. year old female  has a past medical history of Seizures (HCC) and Migraine with aura (2014). here to follow-up for recurrent headaches/migraines.  Restart amitriptyline 10 mg every night for 1 week  then increase to 2 tablets at night printed off RX Phenergan when necessary for nausea printed off RX Given a list of migraine triggers, eliminate one food at a time I spent additional 15 minutes in total face to face time with the patient more than 50% of which was spent counseling and coordination of care, reviewing test results reviewing medications and discussing and reviewing the diagnosis of migraine and further treatment options. Importance of keeping a diary if headaches worsen to include the time of the headache what you're doing any other specific information that would be useful. Discussed stress relief techniques such as deep breathing muscle relaxation mental relaxation to music. Discussed importance of exercise, regular meals  and sleep. Sleep deprivation can be a migraine trigger Keep a record of headaches if they worsen and bring to next appt Stop ibuprofen as it may be causing rebound headaches Follow-up in 2 monthsVst time 25 min Nilda RiggsNancy Carolyn Martin, [facility]GNP, [facility], APRN  [facility] [address], [address] [place], [place] [address] [phone]  I reviewed the above note and documentation by the Nurse Practitioner and agree with the history, physical exam, assessment and plan as outlined above. I was immediately available for face-to-face consultation. Huston FoleySaima Athar, MD, PhD [facility]

## 2016-05-06 ENCOUNTER — Telehealth: Payer: Self-pay | Admitting: Neurology

## 2016-05-06 NOTE — Telephone Encounter (Signed)
Records mailed to patient Patricia Horton

## 2016-05-07 NOTE — Telephone Encounter (Signed)
Records mailed to patient dg °

## 2016-06-05 ENCOUNTER — Ambulatory Visit: Payer: BLUE CROSS/BLUE SHIELD | Admitting: Nurse Practitioner

## 2016-07-15 ENCOUNTER — Encounter: Payer: BLUE CROSS/BLUE SHIELD | Admitting: Advanced Practice Midwife

## 2016-07-28 ENCOUNTER — Encounter: Payer: Self-pay | Admitting: Advanced Practice Midwife

## 2016-07-28 ENCOUNTER — Ambulatory Visit (INDEPENDENT_AMBULATORY_CARE_PROVIDER_SITE_OTHER): Payer: BLUE CROSS/BLUE SHIELD | Admitting: Advanced Practice Midwife

## 2016-07-28 VITALS — BP 130/70 | HR 62 | Wt 179.0 lb

## 2016-07-28 DIAGNOSIS — Z3049 Encounter for surveillance of other contraceptives: Secondary | ICD-10-CM | POA: Diagnosis not present

## 2016-07-28 DIAGNOSIS — Z30017 Encounter for initial prescription of implantable subdermal contraceptive: Secondary | ICD-10-CM

## 2016-07-28 DIAGNOSIS — Z3202 Encounter for pregnancy test, result negative: Secondary | ICD-10-CM

## 2016-07-28 DIAGNOSIS — Z3046 Encounter for surveillance of implantable subdermal contraceptive: Secondary | ICD-10-CM

## 2016-07-28 LAB — POCT URINE PREGNANCY: PREG TEST UR: NEGATIVE

## 2016-07-28 MED ORDER — PROMETHAZINE HCL 12.5 MG PO TABS: 12.5000 mg | ORAL_TABLET | Freq: Four times a day (QID) | ORAL | 1 refills | Status: DC | PRN

## 2016-07-28 MED ORDER — MEGESTROL ACETATE 40 MG PO TABS: ORAL_TABLET | ORAL | 3 refills | Status: DC

## 2016-07-28 NOTE — Assessment & Plan Note (Signed)
[id number]

## 2016-07-28 NOTE — Progress Notes (Signed)
Patricia Horton Scouten 20 y.o. Vitals:   07/28/16 1410  BP: 130/70  Pulse: 62    Past Medical History:  Diagnosis Date  . Migraine with aura 2014   NO ESTROGEN  . Seizures (HCC)     Family History  Problem Relation Age of Onset  . Cancer Paternal Grandmother     breast  . Heart disease Paternal Grandmother   . Diabetes Mother   . Stroke Mother   . Hypertension Mother   . Heart disease Paternal Aunt     Social History   Social History  . Marital status: Single    Spouse name: N/A  . Number of children: N/A  . Years of education: N/A   Occupational History  . Not on file.   Social History Main Topics  . Smoking status: Never Smoker  . Smokeless tobacco: Never Used  . Alcohol use No  . Drug use: No  . Sexual activity: Not Currently    Birth control/ protection: Implant   Other Topics Concern  . Not on file   Social History Narrative  . No narrative on file    HPI:  Patricia SanfilippoDanielle Horton Dresner is here for Nexplanon removal and reinsertion.  She was given informed consent for removal of her Nexplanon and reinsertion of a new one.A signed copy is in the chart.  Appropriate time out taken. Nexlanon site (left arm) identified and thea area was prepped in usual sterile fashon. 2 cc of 1% lidocaine was used to anesthetize the area starting with the distal end of the implant.  A chart review at this point revealed her previous Nexplanon was placed 08/14/14 (pt's mom had told her it was 07/07/13).  However, we decided to go ahead w/placement since insurance had already paid for her device and she was already numb.  A small stab incision was made right beside the implant on the distal portion.  The Nexplanon rod was grasped using hemostats and removed without difficulty.  There was less than 3 cc blood loss. There were no complications. Next, the area was cleansed again and the new Nexplanon was inserted without difficulty.  Steri strips and a pressure bandage were applied.  Pt was instructed to  remove pressure bandage in a few hours, and keep insertion site covered with a bandaid for 3 days.  Follow-up scheduled PRN problems   Refilled megace prn and also phenergan.  Recommended that she see a PCP or GI about persistent (since December) intermittent nausea).  CRESENZO-DISHMAN,Aristea Posada 07/28/2016 2:39 PM

## 2017-01-07 ENCOUNTER — Telehealth: Payer: Self-pay | Admitting: *Deleted

## 2017-01-07 NOTE — Telephone Encounter (Signed)
Patient called stating she has had some heavy bleeding with the Nexplanon. She was prescribed Megace in August and took it but it has not helped. She is also having breast tenderness. Explained to patient that some people bleed with the Nexplanon. Pt has appointment on 2/12. I advised patient to keep her appointment so that she and Dr Emelda FearFerguson can discuss her options. Pt verbalized understanding.

## 2017-01-11 ENCOUNTER — Ambulatory Visit (INDEPENDENT_AMBULATORY_CARE_PROVIDER_SITE_OTHER): Payer: BLUE CROSS/BLUE SHIELD | Admitting: Obstetrics and Gynecology

## 2017-01-11 ENCOUNTER — Encounter: Payer: Self-pay | Admitting: Obstetrics and Gynecology

## 2017-01-11 VITALS — BP 120/78 | Wt 167.8 lb

## 2017-01-11 DIAGNOSIS — Z975 Presence of (intrauterine) contraceptive device: Secondary | ICD-10-CM

## 2017-01-11 DIAGNOSIS — O9229 Other disorders of breast associated with pregnancy and the puerperium: Principal | ICD-10-CM

## 2017-01-11 DIAGNOSIS — N644 Mastodynia: Secondary | ICD-10-CM

## 2017-01-11 NOTE — Progress Notes (Signed)
Patient ID: KOMAL STANGELO, female   DOB: Oct 10, 1996, 21 y.o.   MRN: [id number]   [facility] Clinic Visit  @DATE @            Patient name: Patricia Horton MRN [id number]  Date of birth: 15-Oct-1996  CC & HPI:   Chief Complaint  Patient presents with  . fibrocystic breast     Patricia Horton is a 21 y.o. female G0P0 presenting today for worsening, waxing and waning b/l burning breast pain (L>R) for months. Pt states her left breast pain is generalized and her right breast pain is more lateral. Pt states her pain is worsened when lying on her side, with direct contact and alleviated slightly with ice. She is not currently taking any multivitamins. She denies excessive caffeine consumption. Pt is a non-smoker.  She reports weight fluctuations, from 180 to 160 over the last months. She denies redness, swelling, fever, discharge from the nipple.   She has had her current Nexplanon for 5 months, this is her 2nd implant.   Pt has previously been worked up by [employer] for the issue with CXR.   ROS:  ROS +b/l burning breast pain No redness, swelling or drainage from the nipple  No fever   Pertinent History Reviewed:   Reviewed: Significant Medical         Past Medical History:  Diagnosis Date  . Migraine with aura 2014   NO ESTROGEN  . Seizures (HCC)                               Surgical Hx:   History reviewed. No pertinent surgical history. Medications: Reviewed & Updated - see associated section                       Current Outpatient Prescriptions:  .  amitriptyline (ELAVIL) 10 MG tablet, Take 1 tablet (10 mg total) by mouth at bedtime. 1 po at bedtime for 1 week then increase to 2 tabs at bedtime, Disp: 60 tablet, Rfl: 2 .  etonogestrel (NEXPLANON) 68 MG IMPL implant, Inject 1 each into the skin once., Disp: , Rfl:  .  ibuprofen (ADVIL,MOTRIN) 800 MG tablet, Take 1 tablet (800 mg total) by mouth every 8 (eight) hours as needed for mild pain., Disp: 30 tablet, Rfl: 0 .   promethazine (PHENERGAN) 12.5 MG tablet, Take 1 tablet (12.5 mg total) by mouth every 6 (six) hours as needed for nausea or vomiting., Disp: 30 tablet, Rfl: 1 .  megestrol (MEGACE) 40 MG tablet, Take 3/day (at the same time) for 3 days; 2/day for 2 days, then 1/day PO prn bleeding (Patient not taking: Reported on 01/11/2017), Disp: 60 tablet, Rfl: 3   Social History: Reviewed -  reports that she has never smoked. She has never used smokeless tobacco.  Objective Findings:  Vitals: Blood pressure 120/78, weight 167 lb 12.8 oz (76.1 kg).  Physical Examination: General appearance - alert, well appearing, and in no distress Mental status - alert, oriented to person, place, and time Breasts - breasts appear normal, no suspicious masses, no skin or nipple changes or axillary nodes. No redness, normal movement of the tissue. Diffusely sensitive to light touch, movement and deep palpation. No focal abnormalities.   Assessment & Plan:   A:  1. Mastalgia   P:  1. Pt to try vitamin E and B6 for 4 weeks before consideration of Neurontin  By signing my name below, I, Doreatha MartinEva Mathews, attest that this documentation has been prepared under the direction and in the presence of Tilda BurrowJohn Jenilyn Magana V, MD. Electronically Signed: Doreatha MartinEva Mathews, ED Scribe. 01/11/17. 10:45 AM.  I personally performed the services described in this documentation, which was SCRIBED in my presence. The recorded information has been reviewed and considered accurate. It has been edited as necessary during review. Tilda BurrowFERGUSON,Haiden Clucas V, MD

## 2017-01-11 NOTE — Patient Instructions (Addendum)
Mastalgia (breast pain)  Take over the counter Vitamin E and multivitamin with B6 daily (with at least 100% adult dose). Consider reduction or elimination of caffeine and chocolate consumption. If symptoms persist or worsen, return for reevaluation in 4 weeks.

## 2017-02-08 ENCOUNTER — Ambulatory Visit: Payer: BLUE CROSS/BLUE SHIELD | Admitting: Obstetrics and Gynecology

## 2017-08-04 ENCOUNTER — Ambulatory Visit (INDEPENDENT_AMBULATORY_CARE_PROVIDER_SITE_OTHER): Payer: BLUE CROSS/BLUE SHIELD | Admitting: Advanced Practice Midwife

## 2017-08-04 ENCOUNTER — Encounter: Payer: Self-pay | Admitting: Advanced Practice Midwife

## 2017-08-04 VITALS — BP 104/60 | HR 62 | Ht 64.0 in | Wt 194.0 lb

## 2017-08-04 DIAGNOSIS — Z3046 Encounter for surveillance of implantable subdermal contraceptive: Secondary | ICD-10-CM | POA: Diagnosis not present

## 2017-08-04 DIAGNOSIS — Z3202 Encounter for pregnancy test, result negative: Secondary | ICD-10-CM

## 2017-08-04 LAB — POCT URINE PREGNANCY: PREG TEST UR: NEGATIVE

## 2017-08-04 MED ORDER — NORETHIN-ETH ESTRAD-FE BIPHAS 1 MG-10 MCG / 10 MCG PO TABS: 1.0000 | ORAL_TABLET | Freq: Every day | ORAL | 4 refills | Status: DC

## 2017-08-04 MED ORDER — NORETHIN-ETH ESTRAD-FE BIPHAS 1 MG-10 MCG / 10 MCG PO TABS: 1.0000 | ORAL_TABLET | Freq: Every day | ORAL | 11 refills | Status: DC

## 2017-08-04 NOTE — Progress Notes (Signed)
HPI:  Patricia SanfilippoDanielle M Horton 21 y.o. here for Nexplanon removal.  Her future plans for birth control are COCs.  Wants lo estrogen.  Past Medical History: Past Medical History:  Diagnosis Date  . Migraine with aura 2014   NO ESTROGEN  . Seizures (HCC)     Past Surgical History: History reviewed. No pertinent surgical history.  Family History: Family History  Problem Relation Age of Onset  . Cancer Paternal Grandmother        breast  . Heart disease Paternal Grandmother   . Diabetes Mother   . Stroke Mother   . Hypertension Mother   . Heart disease Paternal Aunt     Social History: Social History  Substance Use Topics  . Smoking status: Never Smoker  . Smokeless tobacco: Never Used  . Alcohol use No    Allergies: No Known Allergies  Meds:  (Not in a hospital admission)    Patient given informed consent for removal of her Nexplanon, time out was performed.  Signed copy in the chart.  Appropriate time out taken. Implanon site identified.  Area prepped in usual sterile fashon. One cc of 1% lidocaine was used to anesthetize the area at the distal end of the implant. A small stab incision was made right beside the implant on the distal portion.  The Nexplanon rod was grasped using hemostats and removed without difficulty.  There was less than 3 cc blood loss. There were no complications.  A small amount of antibiotic ointment and steri-strips were applied over the small incision.  A pressure bandage was applied to reduce any bruising.  The patient tolerated the procedure well and was given post procedure instructions.

## 2017-09-02 ENCOUNTER — Other Ambulatory Visit: Payer: BLUE CROSS/BLUE SHIELD | Admitting: Advanced Practice Midwife

## 2017-09-29 ENCOUNTER — Telehealth: Payer: Self-pay | Admitting: Advanced Practice Midwife

## 2017-09-29 NOTE — Telephone Encounter (Signed)
Patient called with complaints of continued random suprapubic pain.  Still having episodes of lightheadedness and headaches. [facility] not [facility] but has used condoms with her husband since having Nexplanon removed. The nexplanon was removed almost 2 months ago. I don't think these symptoms are still related. Please advise.

## 2017-09-29 NOTE — Telephone Encounter (Signed)
Informed patient that Patricia FreezeFran agreed that she did not think her current problems are still related to the Nexplanon and could make appointment with PCP or one of our providers. Pt verbalized understanding.

## 2017-09-29 NOTE — Telephone Encounter (Signed)
Agreed.  Can see PCP

## 2017-12-28 ENCOUNTER — Ambulatory Visit: Payer: BLUE CROSS/BLUE SHIELD | Admitting: Adult Health

## 2018-08-15 ENCOUNTER — Encounter: Payer: Self-pay | Admitting: Obstetrics and Gynecology

## 2018-08-15 ENCOUNTER — Ambulatory Visit: Payer: BLUE CROSS/BLUE SHIELD | Admitting: Obstetrics and Gynecology

## 2018-08-15 VITALS — BP 116/61 | HR 69 | Ht 64.0 in | Wt 172.4 lb

## 2018-08-15 DIAGNOSIS — N970 Female infertility associated with anovulation: Secondary | ICD-10-CM | POA: Diagnosis not present

## 2018-08-15 NOTE — Progress Notes (Signed)
[facility] Clinic Visit  08/15/2018           Patient name: Patricia Horton MRN [id number]  Date of birth: [birth year]  CC & HPI:  Patricia Horton is a 22 y.o. female presenting today for multiple complaints.   She notes that she has had constant lower abdominal pain x 2 weeks ago. She notes that her abdominal pain is not just prior to the onset of her cycle. She notes that she has pain to her outer tissues with palpation. She hasn't tried intercourse since the onset of her symptoms. Pt has tried ibuprofen with mild relief of her symptoms. She has tried OTC Midol with no relief of her symptoms. Her abdominal pain is not worsened with fried foods. She has 1-2 bowel movements daily and her abdominal pain is exacerbated with her bowel movements. She denies nausea, vomiting, fever, and any other symptoms.   She is also attempting to get pregnant at this time and she is unable to do so. She has been with her current partner for the past year and her periods are chronically irregular. She notes that she used to be on a nexaplanon. She uses the period tracker app that she uses to track her menstrual cycle. Pt has not tried any medications for her symptoms. She has used OTC ovulation test with only one positive result. She denies any other symptoms.   She also notes persistent metrorrhagia. She intermittently will have her menstrual cycle last for several weeks and sometimes it won't. Her last two periods were extremely heavy and she has gone through 2 boxes of 28-32 pads in one cycle. Her LMP started on 08/12/2018 and she has had constant abdominal pain with her current cycle. She voices concern today for if she can obtain a prescription for this. She has tried Megace and she has been on nexplanon in the past.    ROS:  ROS  +abdominal pain -fever -nausea -vomiting +metrorrhagia  All systems are negative except as noted in the HPI and PMH.    Pertinent History Reviewed:   Reviewed: Significant  for  Medical         Past Medical History:  Diagnosis Date   Migraine with aura 2014   NO ESTROGEN   Seizures (HCC)                               Surgical Hx:   History reviewed. No pertinent surgical history. Medications: Reviewed & Updated - see associated section                       Current Outpatient Medications:    amitriptyline (ELAVIL) 10 MG tablet, Take 1 tablet (10 mg total) by mouth at bedtime. 1 po at bedtime for 1 week then increase to 2 tabs at bedtime (Patient taking differently: Take 10 mg by mouth at bedtime. 1 po at bedtime), Disp: 60 tablet, Rfl: 2   ibuprofen (ADVIL,MOTRIN) 800 MG tablet, Take 1 tablet (800 mg total) by mouth every 8 (eight) hours as needed for mild pain., Disp: 30 tablet, Rfl: 0   Social History: Reviewed -  reports that she has never smoked. She has never used smokeless tobacco.  Objective Findings:  Vitals: Blood pressure 116/61, pulse 69, height 5\' 4"  (1.626 m), weight 172 lb 6.4 oz (78.2 kg).  PHYSICAL EXAMINATION General appearance - alert, well appearing, and in no  distress Mental status - alert, oriented to person, place, and time  Discussion ONLY: Discussed with patient fertility apps including MyFertilityFriend.[email or web address] and Flo Period Tracker. Recommended that the pt use the above apps for 1-3 months in order to chronicle monthly menstrual cycles.   At end of discussion, pt had opportunity to ask questions and has no further questions at this time.   Specific discussion of fertility app monthly usage as noted above. Greater than 50% was spent in counseling and coordination of care with the patient.  Total time greater than: 25 minutes.     Assessment & Plan:   A:  1.  AUB 2. Primary infertility 3. Abdominal pain  P:  1.  Draw labs on day 21 of cycle (TSH and progesterone) 2. F/u in 3 weeks for internal exam and follow up  By signing my name below, I, [name], attest that this documentation has been prepared under the  direction and in the presence of Tilda BurrowFerguson, John V, MD. Electronically Signed: [name], Medical Scribe. 08/15/18. 11:36 AM.  I personally performed the services described in this documentation, which was SCRIBED in my presence. The recorded information has been reviewed and considered accurate. It has been edited as necessary during review. Tilda BurrowJohn V Ferguson, MD

## 2018-08-24 ENCOUNTER — Encounter (HOSPITAL_COMMUNITY): Payer: Self-pay

## 2018-08-24 ENCOUNTER — Emergency Department (HOSPITAL_COMMUNITY)
Admission: EM | Admit: 2018-08-24 | Discharge: 2018-08-24 | Disposition: A | Discharge status: Home / Self Care | Payer: Self-pay | Attending: Emergency Medicine | Admitting: Emergency Medicine

## 2018-08-24 ENCOUNTER — Other Ambulatory Visit: Payer: Self-pay

## 2018-08-24 DIAGNOSIS — R109 Unspecified abdominal pain: Secondary | ICD-10-CM | POA: Insufficient documentation

## 2018-08-24 DIAGNOSIS — Z5321 Procedure and treatment not carried out due to patient leaving prior to being seen by health care provider: Secondary | ICD-10-CM | POA: Insufficient documentation

## 2018-08-24 LAB — COMPREHENSIVE METABOLIC PANEL
ALBUMIN: 3.8 g/dL (ref 3.5–5.0)
ALT: 18 U/L (ref 0–44)
AST: 17 U/L (ref 15–41)
Alkaline Phosphatase: 49 U/L (ref 38–126)
Anion gap: 7 (ref 5–15)
BUN: 9 mg/dL (ref 6–20)
CO2: 25 mmol/L (ref 22–32)
CREATININE: 0.81 mg/dL (ref 0.44–1.00)
Calcium: 9.1 mg/dL (ref 8.9–10.3)
Chloride: 106 mmol/L (ref 98–111)
GFR calc Af Amer: >60 mL/min (ref >60)
GFR calc non Af Amer: >60 mL/min (ref >60)
Glucose, Bld: 104 mg/dL — ABNORMAL HIGH (ref 70–99)
Potassium: 3.5 mmol/L (ref 3.5–5.1)
SODIUM: 138 mmol/L (ref 135–145)
Total Bilirubin: 0.5 mg/dL (ref 0.3–1.2)
Total Protein: 7.5 g/dL (ref 6.5–8.1)

## 2018-08-24 LAB — URINALYSIS, ROUTINE W REFLEX MICROSCOPIC
BACTERIA UA: NONE SEEN
Bilirubin Urine: NEGATIVE
Glucose, UA: NEGATIVE mg/dL
HGB URINE DIPSTICK: NEGATIVE
KETONES UR: NEGATIVE mg/dL
Nitrite: NEGATIVE
Protein, ur: NEGATIVE mg/dL
Specific Gravity, Urine: 1.033 — ABNORMAL HIGH (ref 1.005–1.030)
pH: 6 (ref 5.0–8.0)

## 2018-08-24 LAB — CBC WITH DIFFERENTIAL/PLATELET
BASOS PCT: 0 %
Basophils Absolute: 0 10*3/uL (ref 0.0–0.1)
Eosinophils Absolute: 0.1 10*3/uL (ref 0.0–0.7)
Eosinophils Relative: 1 %
HCT: 37.2 % (ref 36.0–46.0)
HEMOGLOBIN: 11.6 g/dL — AB (ref 12.0–15.0)
LYMPHS PCT: 27 %
Lymphs Abs: 1.9 10*3/uL (ref 0.7–4.0)
MCH: 26.1 pg (ref 26.0–34.0)
MCHC: 31.2 g/dL (ref 30.0–36.0)
MCV: 83.8 fL (ref 78.0–100.0)
Monocytes Absolute: 0.6 10*3/uL (ref 0.1–1.0)
Monocytes Relative: 9 %
NEUTROS PCT: 63 %
Neutro Abs: 4.3 10*3/uL (ref 1.7–7.7)
Platelets: 392 10*3/uL (ref 150–400)
RBC: 4.44 MIL/uL (ref 3.87–5.11)
RDW: 13.9 % (ref 11.5–15.5)
WBC: 6.9 10*3/uL (ref 4.0–10.5)

## 2018-08-24 LAB — PREGNANCY, URINE: PREG TEST UR: NEGATIVE

## 2018-08-24 NOTE — ED Triage Notes (Signed)
Pt c/o ruq pain since Saturday.  Reports pain is worse with eating or drinking and says hurts to move.  Pt says she vomited twice Saturday but none since then.  Denies diarrhea.

## 2018-09-02 ENCOUNTER — Other Ambulatory Visit: Payer: BLUE CROSS/BLUE SHIELD

## 2018-09-02 DIAGNOSIS — N979 Female infertility, unspecified: Secondary | ICD-10-CM

## 2018-09-03 LAB — PROGESTERONE: PROGESTERONE: 8.3 ng/mL

## 2018-09-03 LAB — TSH: TSH: 1.14 u[IU]/mL (ref 0.450–4.500)

## 2018-09-07 ENCOUNTER — Ambulatory Visit: Payer: BLUE CROSS/BLUE SHIELD | Admitting: Obstetrics and Gynecology

## 2018-09-07 ENCOUNTER — Encounter: Payer: Self-pay | Admitting: *Deleted

## 2018-09-13 ENCOUNTER — Encounter (HOSPITAL_COMMUNITY): Payer: Self-pay | Admitting: Emergency Medicine

## 2018-09-13 ENCOUNTER — Emergency Department (HOSPITAL_COMMUNITY)
Admission: EM | Admit: 2018-09-13 | Discharge: 2018-09-13 | Discharge status: Against Medical Advice | Payer: Self-pay | Attending: Emergency Medicine | Admitting: Emergency Medicine

## 2018-09-13 DIAGNOSIS — Z5321 Procedure and treatment not carried out due to patient leaving prior to being seen by health care provider: Secondary | ICD-10-CM | POA: Insufficient documentation

## 2018-09-13 DIAGNOSIS — R1030 Lower abdominal pain, unspecified: Secondary | ICD-10-CM | POA: Insufficient documentation

## 2018-09-13 LAB — COMPREHENSIVE METABOLIC PANEL
ALBUMIN: 3.7 g/dL (ref 3.5–5.0)
ALK PHOS: 40 U/L (ref 38–126)
ALT: 20 U/L (ref 0–44)
AST: 20 U/L (ref 15–41)
Anion gap: 5 (ref 5–15)
BILIRUBIN TOTAL: 0.4 mg/dL (ref 0.3–1.2)
BUN: 9 mg/dL (ref 6–20)
CO2: 25 mmol/L (ref 22–32)
CREATININE: 0.92 mg/dL (ref 0.44–1.00)
Calcium: 9.1 mg/dL (ref 8.9–10.3)
Chloride: 107 mmol/L (ref 98–111)
GFR calc Af Amer: >60 mL/min (ref >60)
GFR calc non Af Amer: >60 mL/min (ref >60)
GLUCOSE: 96 mg/dL (ref 70–99)
POTASSIUM: 3.5 mmol/L (ref 3.5–5.1)
Sodium: 137 mmol/L (ref 135–145)
TOTAL PROTEIN: 7.2 g/dL (ref 6.5–8.1)

## 2018-09-13 LAB — CBC
HEMATOCRIT: 36 % (ref 36.0–46.0)
HEMOGLOBIN: 11.3 g/dL — AB (ref 12.0–15.0)
MCH: 26.1 pg (ref 26.0–34.0)
MCHC: 31.4 g/dL (ref 30.0–36.0)
MCV: 83.1 fL (ref 80.0–100.0)
Platelets: 380 10*3/uL (ref 150–400)
RBC: 4.33 MIL/uL (ref 3.87–5.11)
RDW: 14 % (ref 11.5–15.5)
WBC: 5.9 10*3/uL (ref 4.0–10.5)
nRBC: 0 % (ref 0.0–0.2)

## 2018-09-13 LAB — HCG, QUANTITATIVE, PREGNANCY: hCG, Beta Chain, Quant, S: <1 m[IU]/mL (ref <5)

## 2018-09-13 LAB — I-STAT BETA HCG BLOOD, ED (MC, WL, AP ONLY)

## 2018-09-13 NOTE — ED Triage Notes (Signed)
Patient to ED c/o lower abdominal pain and spotting x 2 days - reports LMP about 4 weeks ago - she took 3 at-home pregnancy tests 1 week ago and they were all positive. 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 Has OB-GYN appointment 10/25, but states pain worsened.

## 2018-09-13 NOTE — ED Notes (Signed)
Pt states that she can not stay any longer because she needs to go pick up her son.

## 2018-09-20 ENCOUNTER — Ambulatory Visit: Payer: BLUE CROSS/BLUE SHIELD | Admitting: Obstetrics and Gynecology

## 2019-03-06 ENCOUNTER — Telehealth: Payer: Self-pay | Admitting: Family

## 2019-03-06 ENCOUNTER — Telehealth: Payer: Self-pay | Admitting: Obstetrics and Gynecology

## 2019-03-06 DIAGNOSIS — R102 Pelvic and perineal pain: Secondary | ICD-10-CM

## 2019-03-06 DIAGNOSIS — M549 Dorsalgia, unspecified: Secondary | ICD-10-CM

## 2019-03-06 NOTE — Telephone Encounter (Signed)
Call cannot be completed @ 10:10 am. Encounter closed. JSY

## 2019-03-06 NOTE — Telephone Encounter (Signed)
Patient called stating that she needs an appointment with Dr. Emelda Fear for an STD screening. Please contact pt

## 2019-03-06 NOTE — Progress Notes (Signed)
Based on what you shared with me, I feel your condition warrants further evaluation and I recommend that you be seen for a face to face office visit.  Given your symptoms, you need to be seen face to face to be evaluated.   NOTE: If you entered your credit card information for this eVisit, you will not be charged. You may see a "hold" on your card for the $35 but that hold will drop off and you will not have a charge processed.  If you are having a true medical emergency please call 911.  If you need an urgent face to face visit, [place] has four urgent care centers for your convenience.    PLEASE NOTE: THE [facility] LOCATIONS AND URGENT CARE CLINICS DO NOT HAVE THE TESTING FOR CORONAVIRUS COVID19 AVAILABLE.  IF YOU FEEL YOU NEED THIS TEST YOU MUST GO TO A TRIAGE LOCATION AT ONE OF THE HOSPITAL EMERGENCY DEPARTMENTS   [email or web address] to reserve your spot online an avoid wait times  [facility] [address], [address] [place], [place] [address] Modified hours of operation: Monday-Friday, 10 AM to 6 PM  Saturday & Sunday 10 AM to 4 PM *Across the street from [employer]  [employer] (New Address!) [address] [place], [place] [address] *Just off [employer], across the road from [place]* Modified hours of operation: Monday-Friday, 10 AM to 5 PM  Closed Saturday & Sunday   The following sites will take your insurance:  . [facility]  [phone] Get Driving Directions Find a Provider at this Location  [address] [place], [place] [address] . 10 am to 8 pm Monday-Friday . 12 pm to 8 pm Saturday-Sunday   . [facility] at [facility]  [phone] Get Driving Directions Find a Provider at this Location  [address] [place], [place] [address] . 8 am to 8 pm Monday-Friday . 9 am to 6 pm Saturday . 11 am to 6 pm Sunday   . [facility]  at [facility]  [phone] Get Driving Directions  [address] [place], [place] [address] . 8 am to 8 pm Monday-Friday . 8 am to 4 pm Saturday-Sunday   Your e-visit answers were reviewed by a board certified advanced clinical practitioner to complete your personal care plan.  Thank you for using e-Visits.

## 2019-03-20 ENCOUNTER — Other Ambulatory Visit: Payer: Self-pay

## 2019-03-20 ENCOUNTER — Emergency Department (HOSPITAL_COMMUNITY)
Admission: EM | Admit: 2019-03-20 | Discharge: 2019-03-21 | Disposition: A | Discharge status: Home / Self Care | Payer: Self-pay | Attending: Emergency Medicine | Admitting: Emergency Medicine

## 2019-03-20 ENCOUNTER — Emergency Department (HOSPITAL_COMMUNITY): Payer: Self-pay

## 2019-03-20 ENCOUNTER — Encounter (HOSPITAL_COMMUNITY): Payer: Self-pay | Admitting: *Deleted

## 2019-03-20 DIAGNOSIS — Z113 Encounter for screening for infections with a predominantly sexual mode of transmission: Secondary | ICD-10-CM | POA: Insufficient documentation

## 2019-03-20 DIAGNOSIS — R52 Pain, unspecified: Secondary | ICD-10-CM | POA: Insufficient documentation

## 2019-03-20 DIAGNOSIS — R0602 Shortness of breath: Secondary | ICD-10-CM

## 2019-03-20 DIAGNOSIS — R05 Cough: Secondary | ICD-10-CM | POA: Insufficient documentation

## 2019-03-20 DIAGNOSIS — J029 Acute pharyngitis, unspecified: Secondary | ICD-10-CM | POA: Insufficient documentation

## 2019-03-20 DIAGNOSIS — R5381 Other malaise: Secondary | ICD-10-CM

## 2019-03-20 DIAGNOSIS — Z79899 Other long term (current) drug therapy: Secondary | ICD-10-CM | POA: Insufficient documentation

## 2019-03-20 LAB — COMPREHENSIVE METABOLIC PANEL
ALT: 22 U/L (ref 0–44)
AST: 22 U/L (ref 15–41)
Albumin: 4.2 g/dL (ref 3.5–5.0)
Alkaline Phosphatase: 47 U/L (ref 38–126)
Anion gap: 10 (ref 5–15)
BUN: 11 mg/dL (ref 6–20)
CO2: 18 mmol/L — ABNORMAL LOW (ref 22–32)
Calcium: 9.6 mg/dL (ref 8.9–10.3)
Chloride: 104 mmol/L (ref 98–111)
Creatinine, Ser: 0.91 mg/dL (ref 0.44–1.00)
GFR calc Af Amer: >60 mL/min (ref >60)
GFR calc non Af Amer: >60 mL/min (ref >60)
Glucose, Bld: 94 mg/dL (ref 70–99)
Potassium: 3.7 mmol/L (ref 3.5–5.1)
Sodium: 132 mmol/L — ABNORMAL LOW (ref 135–145)
Total Bilirubin: 0.5 mg/dL (ref 0.3–1.2)
Total Protein: 7.8 g/dL (ref 6.5–8.1)

## 2019-03-20 LAB — URINALYSIS, ROUTINE W REFLEX MICROSCOPIC
Bilirubin Urine: NEGATIVE
Glucose, UA: NEGATIVE mg/dL
Ketones, ur: 20 mg/dL — AB
Leukocytes,Ua: NEGATIVE
Nitrite: NEGATIVE
Protein, ur: NEGATIVE mg/dL
Specific Gravity, Urine: 1.015 (ref 1.005–1.030)
pH: 9 — ABNORMAL HIGH (ref 5.0–8.0)

## 2019-03-20 LAB — CBC WITH DIFFERENTIAL/PLATELET
Abs Immature Granulocytes: 0.08 10*3/uL — ABNORMAL HIGH (ref 0.00–0.07)
Basophils Absolute: 0 10*3/uL (ref 0.0–0.1)
Basophils Relative: 0 %
Eosinophils Absolute: 0 10*3/uL (ref 0.0–0.5)
Eosinophils Relative: 0 %
HCT: 37.4 % (ref 36.0–46.0)
Hemoglobin: 11.9 g/dL — ABNORMAL LOW (ref 12.0–15.0)
Immature Granulocytes: 1 %
Lymphocytes Relative: 8 %
Lymphs Abs: 1.1 10*3/uL (ref 0.7–4.0)
MCH: 26 pg (ref 26.0–34.0)
MCHC: 31.8 g/dL (ref 30.0–36.0)
MCV: 81.8 fL (ref 80.0–100.0)
Monocytes Absolute: 0.7 10*3/uL (ref 0.1–1.0)
Monocytes Relative: 5 %
Neutro Abs: 12.1 10*3/uL — ABNORMAL HIGH (ref 1.7–7.7)
Neutrophils Relative %: 86 %
Platelets: 354 10*3/uL (ref 150–400)
RBC: 4.57 MIL/uL (ref 3.87–5.11)
RDW: 13.3 % (ref 11.5–15.5)
WBC: 14.1 10*3/uL — ABNORMAL HIGH (ref 4.0–10.5)
nRBC: 0 % (ref 0.0–0.2)

## 2019-03-20 LAB — WET PREP, GENITAL
Clue Cells Wet Prep HPF POC: NONE SEEN
Sperm: NONE SEEN
Trich, Wet Prep: NONE SEEN
WBC, Wet Prep HPF POC: NONE SEEN
Yeast Wet Prep HPF POC: NONE SEEN

## 2019-03-20 LAB — PREGNANCY, URINE: Preg Test, Ur: NEGATIVE

## 2019-03-20 LAB — INFLUENZA PANEL BY PCR (TYPE A & B)
Influenza A By PCR: NEGATIVE
Influenza B By PCR: NEGATIVE

## 2019-03-20 LAB — GROUP A STREP BY PCR: Group A Strep by PCR: NOT DETECTED

## 2019-03-20 LAB — TROPONIN I: Troponin I: <0.03 ng/mL (ref <0.03)

## 2019-03-20 MED ORDER — CEFTRIAXONE SODIUM 250 MG IJ SOLR
250.0000 mg | Freq: Once | INTRAMUSCULAR | Status: AC
  Administered 2019-03-21: 250 mg via INTRAMUSCULAR
  Filled 2019-03-20: qty 250

## 2019-03-20 MED ORDER — DEXTROSE 5 % IV SOLN: 250.0000 mg | Freq: Once | INTRAVENOUS | Status: DC

## 2019-03-20 MED ORDER — AZITHROMYCIN 1 G PO PACK
1.0000 g | PACK | Freq: Once | ORAL | Status: AC
  Administered 2019-03-20: 23:00:00 1 g via ORAL
  Filled 2019-03-20: qty 1

## 2019-03-20 MED ORDER — PREDNISONE 20 MG PO TABS
60.0000 mg | ORAL_TABLET | Freq: Once | ORAL | Status: AC
  Administered 2019-03-20: 23:00:00 60 mg via ORAL
  Filled 2019-03-20: qty 3

## 2019-03-20 MED ORDER — PREDNISONE 10 MG PO TABS: 60.0000 mg | ORAL_TABLET | Freq: Every day | ORAL | 0 refills | Status: AC

## 2019-03-20 MED ORDER — ALBUTEROL SULFATE HFA 108 (90 BASE) MCG/ACT IN AERS
4.0000 | INHALATION_SPRAY | Freq: Once | RESPIRATORY_TRACT | Status: AC
  Administered 2019-03-20: 20:00:00 4 via RESPIRATORY_TRACT
  Filled 2019-03-20: qty 6.7

## 2019-03-20 MED ORDER — ACETAMINOPHEN 500 MG PO TABS
1000.0000 mg | ORAL_TABLET | Freq: Once | ORAL | Status: AC
  Administered 2019-03-20: 20:00:00 1000 mg via ORAL
  Filled 2019-03-20: qty 2

## 2019-03-20 MED ORDER — LACTATED RINGERS IV BOLUS
1000.0000 mL | Freq: Once | INTRAVENOUS | Status: AC
  Administered 2019-03-20: 23:00:00 1000 mL via INTRAVENOUS

## 2019-03-20 NOTE — Discharge Instructions (Addendum)
I have sent a prescription of prednisone for the next 3 days for your asthma exacerbation to your pharmacy.

## 2019-03-20 NOTE — ED Provider Notes (Signed)
[facility] EMERGENCY DEPARTMENT Provider Note   CSN: [id number] Arrival date & time: 03/20/19  1835    History   Chief Complaint Chief Complaint  Patient presents with  . Shortness of Breath    HPI Patricia Horton is a 23 y.o. female.     HPI 23 year old woman with a history of asthma presents to the emergency department for 12 hours of full body myalgias, shortness of breath, fatigue.  No nausea or vomiting.  States that she has pain all over her body including her arms, legs, abdomen, chest.  Has a sore throat at this time with an increasing cough.  Concerned that she might have the COVID virus but she does not know if she has any specific exposures.  Currently on her menstrual cycle.  Feels that she is dehydrated.  Had been in her normal state of health up until this happened.  Wants to be tested for sexually transmitted infections as she found out that her husband recently was diagnosed with gonorrhea. Past Medical History:  Diagnosis Date  . Migraine with aura 2014   NO ESTROGEN  . Seizures [facility])     Patient Active Problem List   Diagnosis Date Noted  . Nexplanon insertion 07/28/2016  . Migraine with aura 08/14/2014  . Nexplanon in place 03/01/2013  . Seizures (HCC) 03/01/2013    History reviewed. No pertinent surgical history.   OB History    Gravida  1   Para  0   Term  0   Preterm  0   AB  0   Living  0     SAB  0   TAB  0   Ectopic  0   Multiple  0   Live Births  0            Home Medications    Prior to Admission medications   Medication Sig Start Date End Date Taking? Authorizing Provider  amitriptyline (ELAVIL) 10 MG tablet Take 1 tablet (10 mg total) by mouth at bedtime. 1 po at bedtime for 1 week then increase to 2 tabs at bedtime Patient taking differently: Take 10 mg by mouth at bedtime. 1 po at bedtime 03/27/16   Nilda Riggs, NP  ibuprofen (ADVIL,MOTRIN) 800 MG tablet Take 1 tablet (800 mg total)  by mouth every 8 (eight) hours as needed for mild pain. 05/13/15   Ward, Layla Maw, DO    Family History Family History  Problem Relation Age of Onset  . Cancer Paternal Grandmother        breast  . Heart disease Paternal Grandmother   . Diabetes Mother   . Stroke Mother   . Hypertension Mother   . Heart disease Paternal Aunt     Social History Social History   Tobacco Use  . Smoking status: Never Smoker  . Smokeless tobacco: Never Used  Substance Use Topics  . Alcohol use: No  . Drug use: No     Allergies   Patient has no known allergies.   Review of Systems Review of Systems  Constitutional: Positive for activity change, appetite change, chills and fatigue. Negative for fever.  HENT: Positive for sore throat. Negative for congestion and ear pain.   Eyes: Negative for pain and visual disturbance.  Respiratory: Positive for cough and shortness of breath.   Cardiovascular: Positive for chest pain. Negative for palpitations.  Gastrointestinal: Positive for abdominal pain and nausea. Negative for vomiting.  Endocrine: Negative for polyphagia and  polyuria.  Genitourinary: Negative for dysuria and hematuria.  Musculoskeletal: Positive for arthralgias and myalgias. Negative for back pain.  Skin: Negative for color change and rash.  Allergic/Immunologic: Negative for immunocompromised state.  Neurological: Negative for seizures and syncope.  All other systems reviewed and are negative.    Physical Exam Updated Vital Signs BP 111/63 (BP Location: Left Arm)   Pulse (!) 101   Temp 98.2 F (36.8 C) (Oral)   Resp 18   LMP 03/20/2019   SpO2 100%   Physical Exam Vitals signs and nursing note reviewed.  Constitutional:      General: She is not in acute distress.    Appearance: She is well-developed.  HENT:     Head: Normocephalic and atraumatic.  Eyes:     Conjunctiva/sclera: Conjunctivae normal.  Neck:     Musculoskeletal: Neck supple.  Cardiovascular:     Rate  and Rhythm: Normal rate and regular rhythm.     Heart sounds: No murmur.  Pulmonary:     Effort: Pulmonary effort is normal. No respiratory distress.     Breath sounds: No stridor. Examination of the right-middle field reveals decreased breath sounds. Examination of the left-middle field reveals decreased breath sounds. Examination of the right-lower field reveals decreased breath sounds. Examination of the left-lower field reveals decreased breath sounds. Decreased breath sounds present. No wheezing.  Abdominal:     Palpations: Abdomen is soft.     Tenderness: There is no abdominal tenderness.  Genitourinary:    Comments: Small amount of dark blood coming from the cervical office.  No cervical tenderness or cervical motion tenderness.  No purulence or erythema of the vaginal canal. Musculoskeletal:     Right lower leg: She exhibits no tenderness. No edema.     Left lower leg: She exhibits no tenderness. No edema.  Skin:    General: Skin is warm and dry.  Neurological:     General: No focal deficit present.     Mental Status: She is alert.      ED Treatments / Results  Labs (all labs ordered are listed, but only abnormal results are displayed) Labs Reviewed - No data to display  EKG None  Radiology No results found.  Procedures Procedures (including critical care time)  Medications Ordered in ED Medications - No data to display   Initial Impression / Assessment and Plan / ED Course  I have reviewed the triage vital signs and the nursing notes.  Pertinent labs & imaging results that were available during my care of the patient were reviewed by me and considered in my medical decision making (see chart for details).         Hemodynamically stable 23 year old woman presents to the emergency department for evaluation of the above history and physical examination concerning for myalgias, fever, concern for sexually transmitted infection.  Lab work shows a leukocytosis but  electrolytes are otherwise unremarkable.  EKG shows normal sinus rhythm.  Chest x-ray does not show signs of infection.  Treating empirically for possible sexually transmitted infection with IM Rocephin and a gram of azithromycin.  Tolerated this well in the ED.  Clue cells on wet prep are present but the patient is not currently having any signs or symptoms we will not treat with metronidazole at this time.  Gonococcal and chlamydial cultures sent.  Patient had good improvement of her symptoms while in the ED after receiving Tylenol and feels almost back to baseline.  Tolerated a fluid bolus while in the  ED.  Low suspicion for PE given her URI findings including sore throat.  More likely viral URI.  Troponin sent initially to evaluate for possible myocarditis so a second 1 will not be sent as the first 1 was negative. Final Clinical Impressions(s) / ED Diagnoses   Final diagnoses:  Shortness of breath  [facility]    ED Discharge Orders    None       Ina Kick, MD 03/20/19 2345    Derwood Kaplan, MD 03/21/19 [phone]

## 2019-03-20 NOTE — ED Triage Notes (Signed)
Pt in c/o SOB, n/v onset x 7 days worsening today, pt reports x6 vomiting episodes today, denies diarrhea, pt diaphoretic, A&O x4

## 2019-03-21 LAB — CERVICOVAGINAL ANCILLARY ONLY
Chlamydia: POSITIVE — AB
Neisseria Gonorrhea: NEGATIVE

## 2019-03-21 MED ORDER — STERILE WATER FOR INJECTION IJ SOLN
INTRAMUSCULAR | Status: AC
  Administered 2019-03-21: 10 mL
  Filled 2019-03-21: qty 10

## 2019-03-21 NOTE — ED Notes (Signed)
Pt discharged from ED; instructions provided and scripts given; Pt encouraged to return to ED if symptoms worsen and to f/u with PCP; Pt verbalized understanding of all instructions 

## 2019-05-26 ENCOUNTER — Other Ambulatory Visit: Payer: Self-pay

## 2019-05-26 ENCOUNTER — Encounter (HOSPITAL_COMMUNITY): Payer: Self-pay | Admitting: Emergency Medicine

## 2019-05-26 ENCOUNTER — Emergency Department (HOSPITAL_COMMUNITY)
Admission: EM | Admit: 2019-05-26 | Discharge: 2019-05-26 | Disposition: A | Discharge status: Home / Self Care | Payer: Self-pay | Attending: Emergency Medicine | Admitting: Emergency Medicine

## 2019-05-26 DIAGNOSIS — Z79899 Other long term (current) drug therapy: Secondary | ICD-10-CM | POA: Insufficient documentation

## 2019-05-26 DIAGNOSIS — R569 Unspecified convulsions: Secondary | ICD-10-CM | POA: Insufficient documentation

## 2019-05-26 DIAGNOSIS — J029 Acute pharyngitis, unspecified: Secondary | ICD-10-CM | POA: Insufficient documentation

## 2019-05-26 MED ORDER — PENICILLIN G BENZATHINE 1200000 UNIT/2ML IM SUSP
1.2000 10*6.[IU] | Freq: Once | INTRAMUSCULAR | Status: AC
  Administered 2019-05-26: 15:00:00 1.2 10*6.[IU] via INTRAMUSCULAR
  Filled 2019-05-26: qty 2

## 2019-05-26 MED ORDER — IBUPROFEN 600 MG PO TABS: 600.0000 mg | ORAL_TABLET | Freq: Four times a day (QID) | ORAL | 0 refills | Status: DC | PRN

## 2019-05-26 MED ORDER — DEXAMETHASONE SODIUM PHOSPHATE 10 MG/ML IJ SOLN
10.0000 mg | Freq: Once | INTRAMUSCULAR | Status: AC
  Administered 2019-05-26: 10 mg via INTRAMUSCULAR
  Filled 2019-05-26: qty 1

## 2019-05-26 NOTE — ED Provider Notes (Signed)
[facility] EMERGENCY DEPARTMENT Provider Note   CSN: [id number] Arrival date & time: 05/26/19  1342    History   Chief Complaint Chief Complaint  Patient presents with  . Sore Throat    HPI Patricia SanfilippoDanielle M Horton is a 23 y.o. female.     HPI  The patient is a 23 year old female, presents with a complaint of a sore throat which is been gradually worsening over the week.  She states that her sore throat is so severe that she has a hard time swallowing even liquids.  She has not had very much to eat, she denies abdominal pain but is nauseated sometimes with the pain.  There is no coughing or shortness of breath and no sick contacts, no exposures, no travel.  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  The patient has not had any further evaluation for this prior to this visit today.  The symptoms at this time are severe.  Past Medical History:  Diagnosis Date  . Migraine with aura 2014   NO ESTROGEN  . Seizures [facility](HCC)     Patient Active Problem List   Diagnosis Date Noted  . Nexplanon insertion 07/28/2016  . Migraine with aura 08/14/2014  . Nexplanon in place 03/01/2013  . Seizures (HCC) 03/01/2013    History reviewed. No pertinent surgical history.   OB History    Gravida  1   Para  0   Term  0   Preterm  0   AB  0   Living  0     SAB  0   TAB  0   Ectopic  0   Multiple  0   Live Births  0            Home Medications    Prior to Admission medications   Medication Sig Start Date End Date Taking? Authorizing Provider  albuterol (PROVENTIL) (2.5 MG/3ML) 0.083% nebulizer solution Take 2.5 mg by nebulization at bedtime.    [provider]  albuterol (VENTOLIN HFA) 108 (90 Base) MCG/ACT inhaler Inhale 2 puffs into the lungs every 3 (three) hours as needed for wheezing or shortness of breath.    [provider]  amitriptyline (ELAVIL) 10 MG tablet Take 1 tablet (10 mg total) by mouth at bedtime. 1 po at bedtime for 1 week then increase to  2 tabs at bedtime Patient not taking: Reported on 03/20/2019 03/27/16   Nilda RiggsMartin, Nancy Carolyn, NP  ibuprofen (ADVIL) 600 MG tablet Take 1 tablet (600 mg total) by mouth every 6 (six) hours as needed. 05/26/19   Eber HongMiller, Fleta Borgeson, MD    Family History Family History  Problem Relation Age of Onset  . Cancer Paternal Grandmother        breast  . Heart disease Paternal Grandmother   . Diabetes Mother   . Stroke Mother   . Hypertension Mother   . Heart disease Paternal Aunt     Social History Social History   Tobacco Use  . Smoking status: Never Smoker  . Smokeless tobacco: Never Used  Substance Use Topics  . Alcohol use: No  . Drug use: No     Allergies   Patient has no known allergies.   Review of Systems Review of Systems  Constitutional: Negative for fever.  HENT: Positive for sore throat.   Respiratory: Negative for cough and shortness of breath.      Physical Exam Updated Vital Signs BP 109/64 (BP Location: Right Arm)   Pulse 85  Temp 99.8 F (37.7 C) (Oral)   Resp 13   LMP 05/23/2019   SpO2 100%   Physical Exam Vitals signs and nursing note reviewed.  Constitutional:      Appearance: She is well-developed. She is not diaphoretic.  HENT:     Head: Normocephalic and atraumatic.     Mouth/Throat:     Mouth: Mucous membranes are moist.     Pharynx: Oropharyngeal exudate and posterior oropharyngeal erythema present. No uvula swelling.     Tonsils: Tonsillar exudate present. No tonsillar abscesses. 2+ on the right. 2+ on the left.  Eyes:     General:        Right eye: No discharge.        Left eye: No discharge.     Conjunctiva/sclera: Conjunctivae normal.  Neck:     Musculoskeletal: Normal range of motion and neck supple.     Comments: Anterior tender cervical lymphadenopathy, no posterior lymphadenopathy, very supple neck, no trismus or torticollis Pulmonary:     Effort: Pulmonary effort is normal. No respiratory distress.  Abdominal:     Comments:  Nontender, no hepatosplenomegaly  Lymphadenopathy:     Cervical: Cervical adenopathy present.  Skin:    General: Skin is warm and dry.     Findings: No erythema or rash.  Neurological:     Mental Status: She is alert.     Coordination: Coordination normal.      ED Treatments / Results  Labs (all labs ordered are listed, but only abnormal results are displayed) Labs Reviewed - No data to display  EKG None  Radiology No results found.  Procedures Procedures (including critical care time)  Medications Ordered in ED Medications  penicillin g benzathine (BICILLIN LA) 1200000 UNIT/2ML injection 1.2 Million Units (has no administration in time range)  dexamethasone (DECADRON) injection 10 mg (has no administration in time range)     Initial Impression / Assessment and Plan / ED Course  I have reviewed the triage vital signs and the nursing notes.  Pertinent labs & imaging results that were available during my care of the patient were reviewed by me and considered in my medical decision making (see chart for details).        Uvula is midline, the patient appears to have acute bacterial pharyngitis.  Will treat with penicillin and Decadron, patient agreeable.  She will be discharged with an anti-inflammatory and instructions for return.  Final Clinical Impressions(s) / ED Diagnoses   Final diagnoses:  Acute pharyngitis, unspecified etiology    ED Discharge Orders         Ordered    ibuprofen (ADVIL) 600 MG tablet  Every 6 hours PRN     05/26/19 1439           Noemi Chapel, MD 05/26/19 1443

## 2019-05-26 NOTE — Discharge Instructions (Signed)
Your exam is consistent with a sore throat which is likely strep throat.  It may be a virus in which case you may not get better for up to another week however I anticipate that if this is strep throat you will be better within 48 hours or so.  We have given you 2 medications today  Penicillin shot, you do not need to take any more antibiotics after taking the shot  Decadron which is a steroid shot, this will help with pain and swelling in your throat.  If you should develop severe or worsening symptoms, if you are not improving after 2 to 3 days or if you have any other concerns please return to the emergency department immediately.

## 2019-05-26 NOTE — ED Notes (Signed)
ED Provider at bedside. 

## 2019-05-26 NOTE — ED Triage Notes (Signed)
Patient reports sore throat x 1 week, no fever. C/O body aches today.

## 2019-09-29 ENCOUNTER — Emergency Department (HOSPITAL_COMMUNITY): Admission: EM | Admit: 2019-09-29 | Discharge: 2019-09-30 | Discharge status: Against Medical Advice | Payer: Self-pay

## 2019-09-29 NOTE — ED Notes (Signed)
Called for triage X3 no answer 

## 2019-10-09 IMAGING — DX PORTABLE CHEST - 1 VIEW
1 series · 1 of 1 positions shown · non-contrast
Comparison: 12/22/2013

CLINICAL DATA: Shortness of breath

EXAM:
PORTABLE CHEST 1 VIEW

[chest]
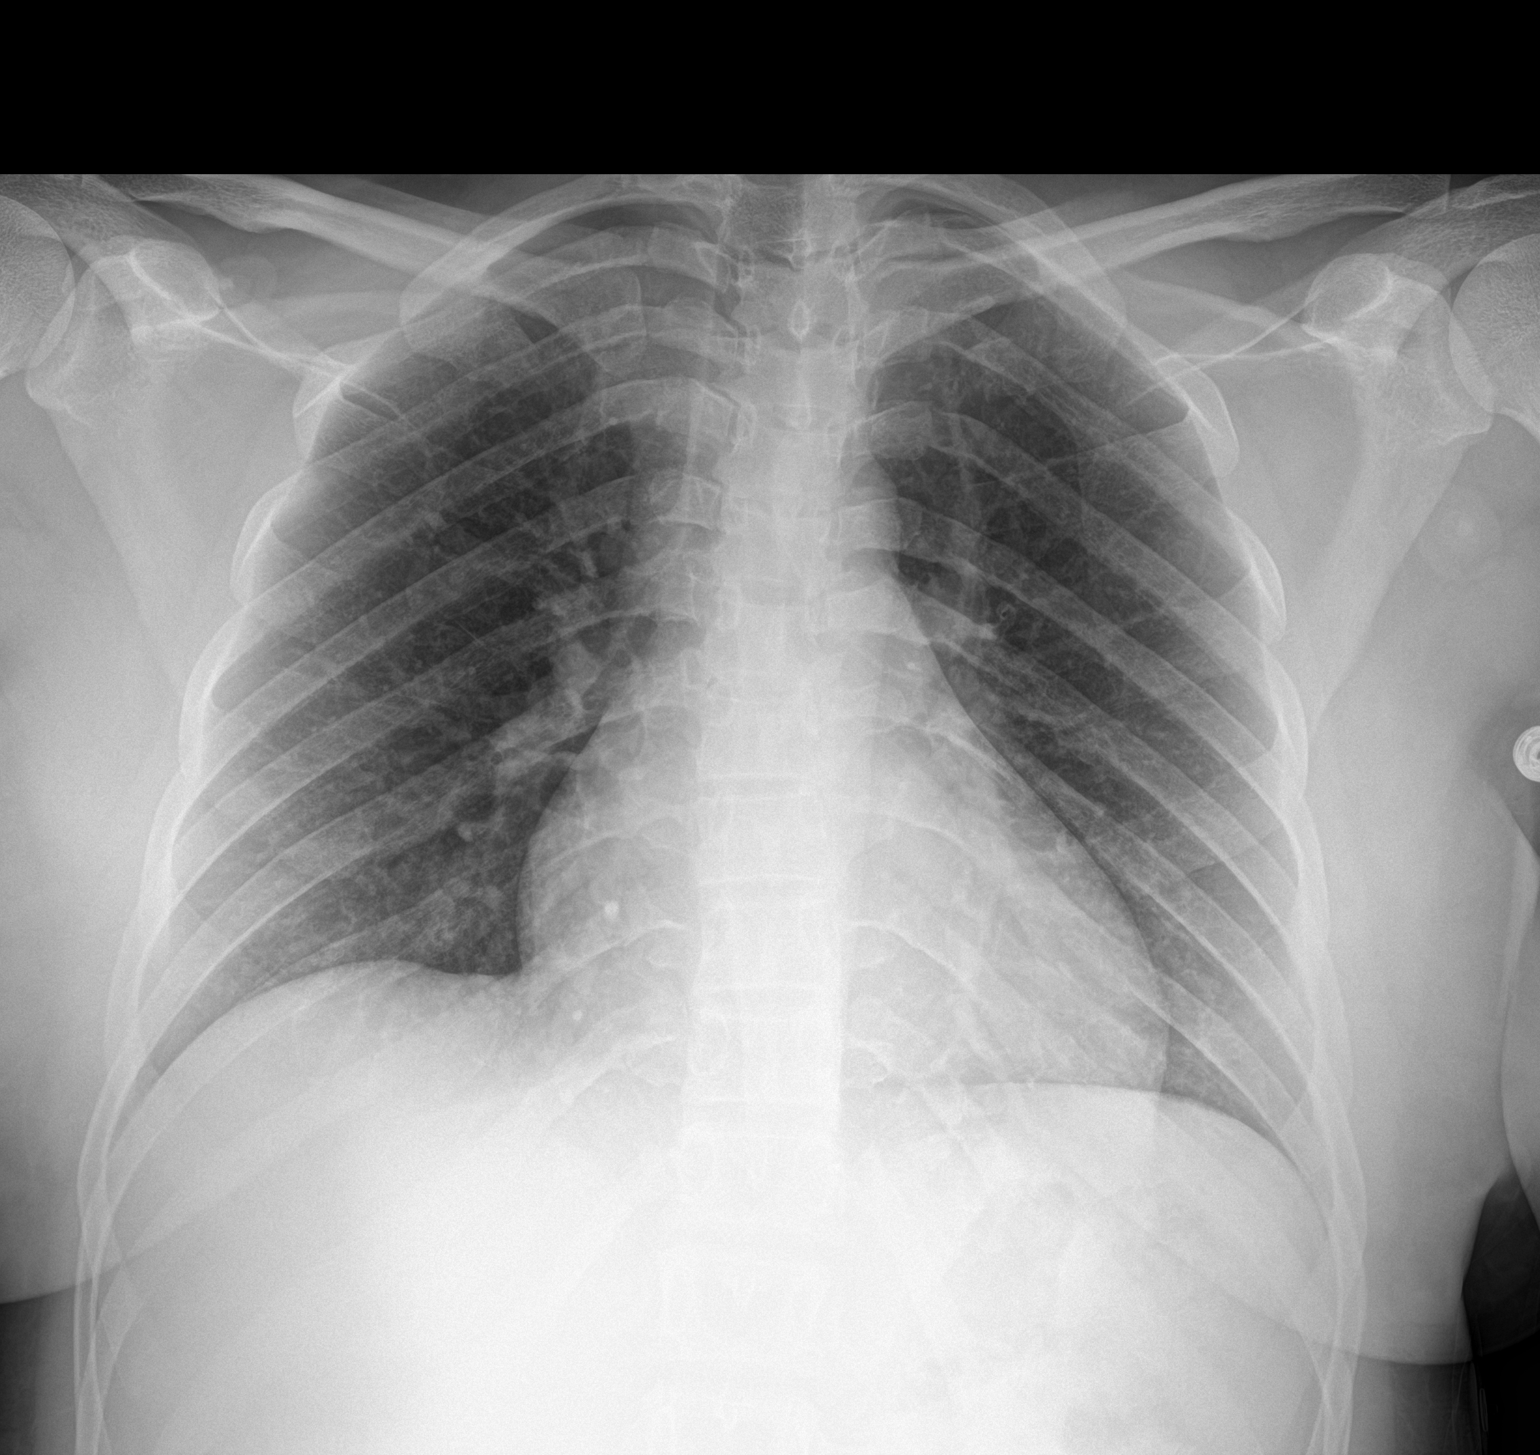

[1 of 1 positions shown; findings below may reference images not displayed]

FINDINGS: Heart and mediastinal contours are within normal limits. No focal
opacities or effusions. No acute bony abnormality.
IMPRESSION: No active disease.

## 2019-11-06 ENCOUNTER — Ambulatory Visit (INDEPENDENT_AMBULATORY_CARE_PROVIDER_SITE_OTHER): Payer: Self-pay | Admitting: *Deleted

## 2019-11-06 ENCOUNTER — Other Ambulatory Visit: Payer: Self-pay

## 2019-11-06 DIAGNOSIS — Z3202 Encounter for pregnancy test, result negative: Secondary | ICD-10-CM

## 2019-11-06 DIAGNOSIS — N926 Irregular menstruation, unspecified: Secondary | ICD-10-CM

## 2019-11-06 LAB — POCT URINE PREGNANCY: Preg Test, Ur: NEGATIVE

## 2019-11-06 NOTE — Progress Notes (Signed)
Chart reviewed for nurse visit. Agree with plan of care.  Estill Dooms, NP 11/06/2019 9:51 AM

## 2019-11-06 NOTE — Progress Notes (Signed)
   NURSE VISIT- PREGNANCY CONFIRMATION   SUBJECTIVE:  Patricia Horton is a 23 y.o. G1P0000 female at [redacted]w[redacted]d by certain LMP of Patient's last menstrual period was 05/23/2019. Here for pregnancy confirmation.  Home pregnancy test: postive x2 and negative x2  She reports no complaints.  She is not taking prenatal vitamins.    OBJECTIVE:  LMP 05/23/2019   Appears well, in no apparent distress OB History  Gravida Para Term Preterm AB Living  1 0 0 0 0 0  SAB TAB Ectopic Multiple Live Births  0 0 0 0 0    # Outcome Date GA Lbr Len/2nd Weight Sex Delivery Anes PTL Lv  1 Current             No results found for this or any previous visit (from the past 24 hour(s)).  ASSESSMENT: Negative pregnancy test    PLAN: Schedule for dating ultrasound in after hcg labs are back.  Prenatal vitamins: plans to begin OTC ASAP   Nausea medicines: not currently needed   OB packet given: No  Quinita Kostelecky, Celene Squibb  11/06/2019 9:18 AM

## 2019-11-07 LAB — BETA HCG QUANT (REF LAB): hCG Quant: <1 m[IU]/mL

## 2019-11-27 ENCOUNTER — Other Ambulatory Visit: Payer: Self-pay

## 2019-11-27 ENCOUNTER — Ambulatory Visit: Payer: HRSA Program | Attending: Internal Medicine

## 2019-11-27 DIAGNOSIS — Z20828 Contact with and (suspected) exposure to other viral communicable diseases: Secondary | ICD-10-CM | POA: Diagnosis present

## 2019-11-27 DIAGNOSIS — Z20822 Contact with and (suspected) exposure to covid-19: Secondary | ICD-10-CM

## 2019-11-28 LAB — NOVEL CORONAVIRUS, NAA: SARS-CoV-2, NAA: NOT DETECTED

## 2019-12-06 ENCOUNTER — Other Ambulatory Visit: Payer: Self-pay

## 2019-12-06 ENCOUNTER — Ambulatory Visit: Payer: Self-pay | Attending: Internal Medicine

## 2019-12-06 DIAGNOSIS — Z20822 Contact with and (suspected) exposure to covid-19: Secondary | ICD-10-CM | POA: Insufficient documentation

## 2019-12-07 LAB — NOVEL CORONAVIRUS, NAA: SARS-CoV-2, NAA: NOT DETECTED

## 2019-12-11 ENCOUNTER — Ambulatory Visit: Payer: Self-pay | Admitting: Advanced Practice Midwife

## 2019-12-17 ENCOUNTER — Emergency Department (HOSPITAL_COMMUNITY)
Admission: EM | Admit: 2019-12-17 | Discharge: 2019-12-17 | Disposition: A | Discharge status: Home / Self Care | Payer: Self-pay | Attending: Emergency Medicine | Admitting: Emergency Medicine

## 2019-12-17 ENCOUNTER — Other Ambulatory Visit: Payer: Self-pay

## 2019-12-17 ENCOUNTER — Encounter (HOSPITAL_COMMUNITY): Payer: Self-pay | Admitting: *Deleted

## 2019-12-17 DIAGNOSIS — Z20822 Contact with and (suspected) exposure to covid-19: Secondary | ICD-10-CM | POA: Insufficient documentation

## 2019-12-17 DIAGNOSIS — J029 Acute pharyngitis, unspecified: Secondary | ICD-10-CM | POA: Insufficient documentation

## 2019-12-17 DIAGNOSIS — R0602 Shortness of breath: Secondary | ICD-10-CM | POA: Insufficient documentation

## 2019-12-17 LAB — GROUP A STREP BY PCR: Group A Strep by PCR: NOT DETECTED

## 2019-12-17 MED ORDER — BENZONATATE 100 MG PO CAPS: 100.0000 mg | ORAL_CAPSULE | Freq: Three times a day (TID) | ORAL | 0 refills | Status: AC

## 2019-12-17 MED ORDER — FLUTICASONE PROPIONATE 50 MCG/ACT NA SUSP: 2.0000 | Freq: Every day | NASAL | 0 refills | Status: DC

## 2019-12-17 NOTE — ED Notes (Signed)
Patient verbalizes understanding of discharge instructions . Opportunity for questions and answers were provided . Armband removed by staff ,Pt discharged from ED. W/C  offered at D/C  and Declined W/C at D/C and was escorted to lobby by RN.  

## 2019-12-17 NOTE — ED Provider Notes (Signed)
[facility] EMERGENCY DEPARTMENT Provider Note   CSN: [id number] Arrival date & time: 12/17/19  0431     History Chief Complaint  Patient presents with  . Sore Throat    Patricia Horton is a 24 y.o. female.  HPI   24 year old female presenting for evaluation of sore throat, rhinorrhea, nasal congestion for the last 4 days.  Patient developed a cough.  She denies any documented fever.  Has had some shortness of breath.  No chest pain at this time.  No vomiting or diarrhea.  Tried Tylenol Cold and flu without relief of symptoms.  Denies exacerbating or alleviating factors.  Symptoms constant since onset.  Her mother who is currently admitted the hospital with Covid.  She has had negative testing recently.  Past Medical History:  Diagnosis Date  . Migraine with aura 2014   NO ESTROGEN  . Seizures [facility])     Patient Active Problem List   Diagnosis Date Noted  . Nexplanon insertion 07/28/2016  . Migraine with aura 08/14/2014  . Nexplanon in place 03/01/2013  . Seizures ([place]) 03/01/2013    History reviewed. No pertinent surgical history.   OB History    Gravida  1   Para  0   Term  0   Preterm  0   AB  0   Living  0     SAB  0   TAB  0   Ectopic  0   Multiple  0   Live Births  0           Family History  Problem Relation Age of Onset  . Cancer Paternal Grandmother        breast  . Heart disease Paternal Grandmother   . Diabetes Mother   . Stroke Mother   . Hypertension Mother   . Heart disease Paternal Aunt     Social History   Tobacco Use  . Smoking status: Never Smoker  . Smokeless tobacco: Never Used  Substance Use Topics  . Alcohol use: No  . Drug use: No    Home Medications Prior to Admission medications   Medication Sig Start Date End Date Taking? Authorizing Provider  benzonatate (TESSALON) 100 MG capsule Take 1 capsule (100 mg total) by mouth every 8 (eight) hours for 5 days. 12/17/19 12/22/19  Whitleigh Garramone  S, PA-C  fluticasone (FLONASE) 50 MCG/ACT nasal spray Place 2 sprays into both nostrils daily. 12/17/19   Ervie Mccard S, PA-C    Allergies    Patient has no known allergies.  Review of Systems   Review of Systems  Constitutional: Negative for fever.  HENT: Positive for congestion, rhinorrhea and sore throat.   Eyes: Negative for visual disturbance.  Respiratory: Positive for cough and shortness of breath.   Cardiovascular: Negative for chest pain.  Gastrointestinal: Negative for abdominal pain, diarrhea, nausea and vomiting.  Genitourinary: Negative for pelvic pain.  Musculoskeletal: Negative for back pain.  Skin: Negative for rash.  Neurological: Negative for headaches.    Physical Exam Updated Vital Signs BP 110/74 (BP Location: Left Arm)   Pulse 74   Temp 97.6 F (36.4 C) (Oral)   Resp 18   Ht 5\' 4"  (1.626 m)   Wt 86.2 kg   LMP 12/17/2019   SpO2 100%   BMI 32.61 kg/m   Physical Exam Vitals and nursing note reviewed.  Constitutional:      General: She is not in acute distress.    Appearance: She  is well-developed. She is not ill-appearing or toxic-appearing.  HENT:     Head: Normocephalic and atraumatic.     Mouth/Throat:     Mouth: Mucous membranes are moist.     Pharynx: Uvula midline. No pharyngeal swelling, oropharyngeal exudate or posterior oropharyngeal erythema.     Tonsils: No tonsillar exudate or tonsillar abscesses. 0 on the right. 0 on the left.  Eyes:     Conjunctiva/sclera: Conjunctivae normal.  Cardiovascular:     Rate and Rhythm: Normal rate and regular rhythm.     Heart sounds: Normal heart sounds. No murmur.  Pulmonary:     Effort: Pulmonary effort is normal. No respiratory distress.     Breath sounds: Normal breath sounds. No wheezing, rhonchi or rales.  Abdominal:     Palpations: Abdomen is soft.  Musculoskeletal:     Cervical back: Neck supple.  Skin:    General: Skin is warm and dry.  Neurological:     Mental Status: She is alert.      ED Results / Procedures / Treatments   Labs (all labs ordered are listed, but only abnormal results are displayed) Labs Reviewed  GROUP A STREP BY PCR  NOVEL CORONAVIRUS, NAA (HOSP ORDER, SEND-OUT TO REF LAB; TAT 18-24 HRS)    EKG None  Radiology No results found.  Procedures Procedures (including critical care time)  Medications Ordered in ED Medications - No data to display  ED Course  I have reviewed the triage vital signs and the nursing notes.  Pertinent labs & imaging results that were available during my care of the patient were reviewed by me and considered in my medical decision making (see chart for details).    MDM Rules/Calculators/A&P                      Patient presenting for evaluation for Covid.  Reports symptoms ongoing for 4 days.  Patient nontoxic, well-appearing, no distress.  Vital signs are reassuring. Strep test negative.  Tested for Covid in the ED. Results pending at time of discahrge. Advised on quarantine measures. Will give Rx for symptomatic management. Advised on f/u and return precautions. Pt voiced understanding of the plan and reasons to return. All questions answered, pt stable for d/c.  ----  Patricia Horton was evaluated in Emergency Department on 12/17/2019 for the symptoms described in the history of present illness. She was evaluated in the context of the global COVID-19 pandemic, which necessitated consideration that the patient might be at risk for infection with the SARS-CoV-2 virus that causes COVID-19. Institutional protocols and algorithms that pertain to the evaluation of patients at risk for COVID-19 are in a state of rapid change based on information released by regulatory bodies including the CDC and federal and state organizations. These policies and algorithms were followed during the patient's care in the ED.   Final Clinical Impression(s) / ED Diagnoses Final diagnoses:  Person under investigation for COVID-19    Rx  / DC Orders ED Discharge Orders         Ordered    fluticasone (FLONASE) 50 MCG/ACT nasal spray  Daily     12/17/19 0829    benzonatate (TESSALON) 100 MG capsule  Every 8 hours     12/17/19 0829           Karrie Meres, PA-C 12/17/19 0832    Virgina Norfolk, DO 12/17/19 1512

## 2019-12-17 NOTE — ED Triage Notes (Signed)
The pt is c/o having a cold and a sore throat for 4 days  Her mother is upstairs with covid  She has tested negative  lmp  Now.. congestion also

## 2019-12-17 NOTE — Discharge Instructions (Addendum)
Today you were were tested for the coronavirus.  The results will be available in the next 2-3 days.  If the results are positive the hospital will contact you.  If they are negative the hospital would not contact you.  You will need to self quarantine until you are aware of your results.  If they are positive you will need to self quarantine as directed below.  You should be isolated for at least 7 days since the onset of your symptoms AND >72 hours after symptoms resolution (absence of fever without the use of fever reducing medication and improvement in respiratory symptoms), whichever is longer  Please follow up with your primary care provider within 5-7 days for re-evaluation of your symptoms. If you do not have a primary care provider, information for a healthcare clinic has been provided for you to make arrangements for follow up care. Please return to the emergency department for any new or worsening symptoms.   

## 2019-12-18 LAB — NOVEL CORONAVIRUS, NAA (HOSP ORDER, SEND-OUT TO REF LAB; TAT 18-24 HRS): SARS-CoV-2, NAA: NOT DETECTED

## 2020-02-15 ENCOUNTER — Ambulatory Visit: Payer: Self-pay

## 2020-03-19 ENCOUNTER — Other Ambulatory Visit: Payer: Self-pay | Admitting: Adult Health

## 2020-04-16 ENCOUNTER — Encounter: Payer: Self-pay | Admitting: *Deleted

## 2020-04-16 ENCOUNTER — Ambulatory Visit (INDEPENDENT_AMBULATORY_CARE_PROVIDER_SITE_OTHER): Payer: Self-pay | Admitting: *Deleted

## 2020-04-16 ENCOUNTER — Encounter: Payer: Self-pay | Admitting: Obstetrics & Gynecology

## 2020-04-16 VITALS — BP 116/69 | HR 64 | Ht 64.0 in | Wt 192.0 lb

## 2020-04-16 DIAGNOSIS — Z3201 Encounter for pregnancy test, result positive: Secondary | ICD-10-CM

## 2020-04-16 LAB — POCT URINE PREGNANCY: Preg Test, Ur: POSITIVE — AB

## 2020-04-16 MED ORDER — PRENATAL PLUS 27-1 MG PO TABS: 1.0000 | ORAL_TABLET | Freq: Every day | ORAL | 12 refills | Status: DC

## 2020-04-16 MED ORDER — PROMETHAZINE HCL 25 MG PO TABS: 25.0000 mg | ORAL_TABLET | Freq: Four times a day (QID) | ORAL | 1 refills | Status: DC | PRN

## 2020-04-16 NOTE — Progress Notes (Signed)
Chart reviewed for nurse visit. Agree with plan of care. Will rx PNV and phenergan  Adline Potter, NP 04/16/2020 1:08 PM

## 2020-04-16 NOTE — Progress Notes (Signed)
   NURSE VISIT- PREGNANCY CONFIRMATION   SUBJECTIVE:  Patricia Horton is a 24 y.o. G1P0000 female at [redacted]w[redacted]d by certain LMP of Patient's last menstrual period was 02/13/2020. Here for pregnancy confirmation.  Home pregnancy test: positive x 4  She reports nausea and vomiting.  She is not taking prenatal vitamins.    OBJECTIVE:  BP 116/69 (BP Location: Left Arm, Patient Position: Sitting, Cuff Size: Normal)   Pulse 64   Ht 5\' 4"  (1.626 m)   Wt 192 lb (87.1 kg)   LMP 02/13/2020   BMI 32.96 kg/m   Appears well, in no apparent distress OB History  Gravida Para Term Preterm AB Living  1 0 0 0 0 0  SAB TAB Ectopic Multiple Live Births  0 0 0 0 0    # Outcome Date GA Lbr Len/2nd Weight Sex Delivery Anes PTL Lv  1 Current             Results for orders placed or performed in visit on 04/16/20 (from the past 24 hour(s))  POCT urine pregnancy   Collection Time: 04/16/20 10:33 AM  Result Value Ref Range   Preg Test, Ur Positive (A) Negative    ASSESSMENT: Positive pregnancy test, 105w0d by LMP    PLAN: Schedule for dating ultrasound in 2 weeks Prenatal vitamins: note routed to [facility] to send prescription   Nausea medicines: requested-note routed to [facility] to send prescription   OB packet given: Yes  [redacted]w[redacted]d  04/16/2020 10:36 AM

## 2020-04-16 NOTE — Addendum Note (Signed)
Addended by: Cyril Mourning A on: 04/16/2020 01:09 PM   Modules accepted: Orders

## 2020-04-18 ENCOUNTER — Telehealth: Payer: Self-pay | Admitting: Adult Health

## 2020-04-18 MED ORDER — DOXYLAMINE-PYRIDOXINE 10-10 MG PO TBEC: DELAYED_RELEASE_TABLET | ORAL | 1 refills | Status: DC

## 2020-04-18 NOTE — Telephone Encounter (Signed)
Patient called stating that she would like a call back from the nurse, patient did not state the reason for the call. Please contact pt 

## 2020-04-18 NOTE — Telephone Encounter (Signed)
Spoke with patient. She says that the phenergan is making her so sleepy. She even tried just taking half at night but is still drowsy at work. She would like to have another nausea med sent in that won't cause drowsiness.

## 2020-04-18 NOTE — Addendum Note (Signed)
Addended by: Cyril Mourning A on: 04/18/2020 10:46 AM   Modules accepted: Orders

## 2020-04-18 NOTE — Telephone Encounter (Signed)
Will rx diclegis  

## 2020-04-26 ENCOUNTER — Other Ambulatory Visit: Payer: Self-pay | Admitting: Obstetrics and Gynecology

## 2020-04-26 DIAGNOSIS — O3680X0 Pregnancy with inconclusive fetal viability, not applicable or unspecified: Secondary | ICD-10-CM

## 2020-04-30 ENCOUNTER — Encounter: Payer: Self-pay | Admitting: Women's Health

## 2020-04-30 ENCOUNTER — Ambulatory Visit (INDEPENDENT_AMBULATORY_CARE_PROVIDER_SITE_OTHER): Payer: Self-pay

## 2020-04-30 DIAGNOSIS — Z3A08 8 weeks gestation of pregnancy: Secondary | ICD-10-CM

## 2020-04-30 DIAGNOSIS — O3680X0 Pregnancy with inconclusive fetal viability, not applicable or unspecified: Secondary | ICD-10-CM

## 2020-04-30 NOTE — Progress Notes (Signed)
[place] 8+6 wks,single IUP with ys,positive fht 178 bpm,CRL 22.03 mm,normal ovaries

## 2020-05-27 ENCOUNTER — Other Ambulatory Visit: Payer: Self-pay | Admitting: Obstetrics and Gynecology

## 2020-05-27 DIAGNOSIS — Z3682 Encounter for antenatal screening for nuchal translucency: Secondary | ICD-10-CM

## 2020-05-28 ENCOUNTER — Encounter: Payer: Self-pay | Admitting: Women's Health

## 2020-05-28 ENCOUNTER — Ambulatory Visit: Payer: Self-pay | Admitting: *Deleted

## 2020-05-28 ENCOUNTER — Other Ambulatory Visit: Payer: Self-pay

## 2020-06-10 ENCOUNTER — Ambulatory Visit: Payer: Self-pay | Admitting: Advanced Practice Midwife

## 2020-07-16 ENCOUNTER — Other Ambulatory Visit: Payer: Self-pay

## 2020-07-16 ENCOUNTER — Ambulatory Visit: Payer: Self-pay | Attending: Internal Medicine

## 2020-07-16 DIAGNOSIS — Z20822 Contact with and (suspected) exposure to covid-19: Secondary | ICD-10-CM

## 2020-07-16 DIAGNOSIS — Z23 Encounter for immunization: Secondary | ICD-10-CM

## 2020-07-16 NOTE — Progress Notes (Signed)
   Covid-19 Vaccination Clinic  Name:  Patricia Horton    MRN: [id number] DOB: 02/08/1996  07/16/2020  [name] was observed post Covid-19 immunization for 15 minutes without incident. She was provided with Vaccine Information Sheet and instruction to access the V-Safe system.   [name] was instructed to call 911 with any severe reactions post vaccine: Marland Kitchen Difficulty breathing  . Swelling of face and throat  . A fast heartbeat  . A bad rash all over body  . Dizziness and weakness   Immunizations Administered    Name Date Dose VIS Date Route   Pfizer COVID-19 Vaccine 07/16/2020 12:55 PM 0.3 mL 01/24/2019 Intramuscular   Manufacturer: [employer], [employer]   Lot: [id number]   NDC: [address]-0

## 2020-07-17 LAB — SARS-COV-2, NAA 2 DAY TAT

## 2020-07-17 LAB — NOVEL CORONAVIRUS, NAA: SARS-CoV-2, NAA: NOT DETECTED

## 2020-08-06 ENCOUNTER — Ambulatory Visit: Payer: Self-pay

## 2020-08-13 ENCOUNTER — Ambulatory Visit: Payer: Self-pay

## 2020-08-28 ENCOUNTER — Ambulatory Visit (INDEPENDENT_AMBULATORY_CARE_PROVIDER_SITE_OTHER): Payer: Self-pay | Admitting: *Deleted

## 2020-08-28 ENCOUNTER — Encounter: Payer: Self-pay | Admitting: *Deleted

## 2020-08-28 ENCOUNTER — Other Ambulatory Visit: Payer: Self-pay

## 2020-08-28 ENCOUNTER — Encounter: Payer: Self-pay | Admitting: Women's Health

## 2020-08-28 VITALS — BP 124/75 | HR 79 | Ht 64.0 in | Wt 194.0 lb

## 2020-08-28 DIAGNOSIS — Z3201 Encounter for pregnancy test, result positive: Secondary | ICD-10-CM

## 2020-08-28 LAB — POCT URINE PREGNANCY: Preg Test, Ur: POSITIVE — AB

## 2020-08-28 MED ORDER — CITRANATAL ASSURE 35-1 & 300 MG PO MISC: ORAL | 11 refills | Status: DC

## 2020-08-28 MED ORDER — DOXYLAMINE-PYRIDOXINE 10-10 MG PO TBEC: DELAYED_RELEASE_TABLET | ORAL | 6 refills | Status: DC

## 2020-08-28 NOTE — Addendum Note (Signed)
Addended by: Shawna Clamp R on: 08/28/2020 10:01 AM   Modules accepted: Orders

## 2020-08-28 NOTE — Progress Notes (Addendum)
   NURSE VISIT- PREGNANCY CONFIRMATION   SUBJECTIVE:  Patricia Horton is a 24 y.o. G18P0010 female at [redacted]w[redacted]d by certain LMP of Patient's last menstrual period was 07/13/2020. Here for pregnancy confirmation.  Home pregnancy test: positive x 4  She reports n/v and feels cold all the time..  She is not taking prenatal vitamins.    OBJECTIVE:  BP 124/75 (BP Location: Left Arm, Patient Position: Sitting, Cuff Size: Normal)   Pulse 79   Ht 5\' 4"  (1.626 m)   Wt 194 lb (88 kg)   LMP 07/13/2020   BMI 33.30 kg/m   Appears well, in no apparent distress OB History  Gravida Para Term Preterm AB Living  2 0 0 0 1 0  SAB TAB Ectopic Multiple Live Births  1 0 0 0 0    # Outcome Date GA Lbr Len/2nd Weight Sex Delivery Anes PTL Lv  2 Current           1 SAB             No results found for this or any previous visit (from the past 24 hour(s)).  ASSESSMENT: Positive pregnancy test, [redacted]w[redacted]d by LMP    PLAN: Schedule for dating ultrasound in 2 weeks Prenatal vitamins: note routed to [facility] to send prescription   Nausea medicines: requested-note routed to [facility] to send prescription   OB packet given: Yes  [redacted]w[redacted]d  08/28/2020 9:28 AM   Chart reviewed for nurse visit. Agree with plan of care. Rx citranatal assure and diclegis. 08/30/2020, Cheral Marker 08/28/2020 10:00 AM

## 2020-09-13 ENCOUNTER — Other Ambulatory Visit: Payer: Self-pay | Admitting: Obstetrics & Gynecology

## 2020-09-13 DIAGNOSIS — O3680X0 Pregnancy with inconclusive fetal viability, not applicable or unspecified: Secondary | ICD-10-CM

## 2020-09-16 ENCOUNTER — Encounter: Payer: Self-pay | Admitting: Women's Health

## 2020-09-16 ENCOUNTER — Ambulatory Visit (INDEPENDENT_AMBULATORY_CARE_PROVIDER_SITE_OTHER): Payer: Medicaid Other

## 2020-09-16 DIAGNOSIS — O3680X0 Pregnancy with inconclusive fetal viability, not applicable or unspecified: Secondary | ICD-10-CM | POA: Diagnosis not present

## 2020-09-16 DIAGNOSIS — Z3A09 9 weeks gestation of pregnancy: Secondary | ICD-10-CM

## 2020-09-16 NOTE — Progress Notes (Signed)
[place] 9+2 wks,single IUP with YS,positive fht 171 bpm,normal ovaries,CRL 22.74 mm

## 2020-09-29 ENCOUNTER — Other Ambulatory Visit: Payer: Self-pay

## 2020-09-29 ENCOUNTER — Inpatient Hospital Stay (HOSPITAL_COMMUNITY)
Admission: AD | Admit: 2020-09-29 | Discharge: 2020-09-30 | Disposition: A | Discharge status: Home / Self Care | Payer: Medicaid Other | Attending: Obstetrics and Gynecology | Admitting: Obstetrics and Gynecology

## 2020-09-29 DIAGNOSIS — N760 Acute vaginitis: Secondary | ICD-10-CM

## 2020-09-29 DIAGNOSIS — B9689 Other specified bacterial agents as the cause of diseases classified elsewhere: Secondary | ICD-10-CM

## 2020-09-29 DIAGNOSIS — O034 Incomplete spontaneous abortion without complication: Secondary | ICD-10-CM | POA: Insufficient documentation

## 2020-09-29 DIAGNOSIS — O030 Genital tract and pelvic infection following incomplete spontaneous abortion: Secondary | ICD-10-CM | POA: Insufficient documentation

## 2020-09-29 DIAGNOSIS — O26891 Other specified pregnancy related conditions, first trimester: Secondary | ICD-10-CM | POA: Diagnosis not present

## 2020-09-29 DIAGNOSIS — Z3A00 Weeks of gestation of pregnancy not specified: Secondary | ICD-10-CM | POA: Diagnosis not present

## 2020-09-29 DIAGNOSIS — Z20822 Contact with and (suspected) exposure to covid-19: Secondary | ICD-10-CM | POA: Insufficient documentation

## 2020-09-29 DIAGNOSIS — R102 Pelvic and perineal pain: Secondary | ICD-10-CM

## 2020-09-29 DIAGNOSIS — R109 Unspecified abdominal pain: Secondary | ICD-10-CM | POA: Diagnosis not present

## 2020-09-30 ENCOUNTER — Encounter (HOSPITAL_COMMUNITY): Payer: Self-pay | Admitting: *Deleted

## 2020-09-30 ENCOUNTER — Other Ambulatory Visit: Payer: Self-pay

## 2020-09-30 ENCOUNTER — Inpatient Hospital Stay (HOSPITAL_COMMUNITY): Payer: Medicaid Other

## 2020-09-30 ENCOUNTER — Telehealth: Payer: Self-pay

## 2020-09-30 ENCOUNTER — Encounter (HOSPITAL_BASED_OUTPATIENT_CLINIC_OR_DEPARTMENT_OTHER): Payer: Self-pay | Admitting: Obstetrics and Gynecology

## 2020-09-30 ENCOUNTER — Other Ambulatory Visit (HOSPITAL_COMMUNITY)
Admission: RE | Admit: 2020-09-30 | Discharge: 2020-09-30 | Disposition: A | Discharge status: Home / Self Care | Payer: Medicaid Other | Source: Ambulatory Visit | Attending: Obstetrics and Gynecology | Admitting: Obstetrics and Gynecology

## 2020-09-30 DIAGNOSIS — R109 Unspecified abdominal pain: Secondary | ICD-10-CM | POA: Diagnosis not present

## 2020-09-30 DIAGNOSIS — Z20822 Contact with and (suspected) exposure to covid-19: Secondary | ICD-10-CM | POA: Diagnosis not present

## 2020-09-30 DIAGNOSIS — O030 Genital tract and pelvic infection following incomplete spontaneous abortion: Secondary | ICD-10-CM | POA: Diagnosis not present

## 2020-09-30 DIAGNOSIS — O26891 Other specified pregnancy related conditions, first trimester: Secondary | ICD-10-CM | POA: Diagnosis not present

## 2020-09-30 DIAGNOSIS — Z3A00 Weeks of gestation of pregnancy not specified: Secondary | ICD-10-CM | POA: Diagnosis not present

## 2020-09-30 DIAGNOSIS — B9689 Other specified bacterial agents as the cause of diseases classified elsewhere: Secondary | ICD-10-CM | POA: Diagnosis not present

## 2020-09-30 DIAGNOSIS — O034 Incomplete spontaneous abortion without complication: Secondary | ICD-10-CM | POA: Diagnosis not present

## 2020-09-30 LAB — CBC WITH DIFFERENTIAL/PLATELET
Abs Immature Granulocytes: 0.05 10*3/uL (ref 0.00–0.07)
Basophils Absolute: 0 10*3/uL (ref 0.0–0.1)
Basophils Relative: 0 %
Eosinophils Absolute: 0.1 10*3/uL (ref 0.0–0.5)
Eosinophils Relative: 1 %
HCT: 34.4 % — ABNORMAL LOW (ref 36.0–46.0)
Hemoglobin: 11.3 g/dL — ABNORMAL LOW (ref 12.0–15.0)
Immature Granulocytes: 1 %
Lymphocytes Relative: 41 %
Lymphs Abs: 4.2 10*3/uL — ABNORMAL HIGH (ref 0.7–4.0)
MCH: 26.8 pg (ref 26.0–34.0)
MCHC: 32.8 g/dL (ref 30.0–36.0)
MCV: 81.7 fL (ref 80.0–100.0)
Monocytes Absolute: 0.7 10*3/uL (ref 0.1–1.0)
Monocytes Relative: 7 %
Neutro Abs: 5.2 10*3/uL (ref 1.7–7.7)
Neutrophils Relative %: 50 %
Platelets: 308 10*3/uL (ref 150–400)
RBC: 4.21 MIL/uL (ref 3.87–5.11)
RDW: 14 % (ref 11.5–15.5)
WBC: 10.2 10*3/uL (ref 4.0–10.5)
nRBC: 0 % (ref 0.0–0.2)

## 2020-09-30 LAB — WET PREP, GENITAL
Sperm: NONE SEEN
Trich, Wet Prep: NONE SEEN
Yeast Wet Prep HPF POC: NONE SEEN

## 2020-09-30 LAB — GC/CHLAMYDIA PROBE AMP (~~LOC~~) NOT AT ARMC
Chlamydia: POSITIVE — AB
Comment: NEGATIVE
Comment: NORMAL
Neisseria Gonorrhea: NEGATIVE

## 2020-09-30 LAB — URINALYSIS, ROUTINE W REFLEX MICROSCOPIC
Bilirubin Urine: NEGATIVE
Glucose, UA: NEGATIVE mg/dL
Hgb urine dipstick: NEGATIVE
Ketones, ur: NEGATIVE mg/dL
Leukocytes,Ua: NEGATIVE
Nitrite: NEGATIVE
Protein, ur: NEGATIVE mg/dL
Specific Gravity, Urine: 1.023 (ref 1.005–1.030)
pH: 6 (ref 5.0–8.0)

## 2020-09-30 LAB — TYPE AND SCREEN
ABO/RH(D): O NEG
Antibody Screen: NEGATIVE

## 2020-09-30 MED ORDER — METRONIDAZOLE 0.75 % VA GEL: 1.0000 | Freq: Every day | VAGINAL | 0 refills | Status: AC

## 2020-09-30 MED ORDER — ONDANSETRON 4 MG PO TBDP: 4.0000 mg | ORAL_TABLET | Freq: Three times a day (TID) | ORAL | 0 refills | Status: DC | PRN

## 2020-09-30 MED ORDER — OXYCODONE-ACETAMINOPHEN 5-325 MG PO TABS
2.0000 | ORAL_TABLET | ORAL | 0 refills | Status: DC | PRN
Start: 2020-09-30 — End: 2021-07-22

## 2020-09-30 NOTE — ED Triage Notes (Signed)
The pt is  Pregnant for 2 days she has had abd pain for 2 days no vaginal bleeding lmp June 6th she has had a miscarriage in the past  And this pain feels the same US done the middle of october

## 2020-09-30 NOTE — Telephone Encounter (Signed)
-----   Message from Raelyn Mora, [place] sent at 09/30/2020  3:54 AM EDT ----- Regarding: Schedule D&E Please schedule this patient for a D&E. She has diagnosed with an incomplete miscarriage at 11.3 wks. I consulted Dr. Vergie Living and he advised I message you to get her scheduled for surgery. If you have any questions, please let me know.  Thanks,  Raelyn Mora, CNM

## 2020-09-30 NOTE — MAU Note (Signed)
.  Patricia Horton is a 24 y.o. at [redacted]w[redacted]d here in MAU reporting: Constant sharp lower mid-right abdominal pain that began two weeks ago but has gotten worse today. Denies vaginal bleeding. Pt reports she has had normal vaginal discharge but it has a "different" odor to it. Pt reports she has vomited x4 in the past 24 hours  Pain score: 9/10 Vitals:   09/29/20 2355 09/30/20 0032  BP: 116/70 114/66  Pulse: 83 87  Resp: 18 18  Temp: 98.4 F (36.9 C) 98.6 F (37 C)  SpO2: 100% 100%

## 2020-09-30 NOTE — Telephone Encounter (Signed)
Called patient no answer, will call again later.

## 2020-09-30 NOTE — Telephone Encounter (Signed)
Called patient, no answer, left detailed message  Surgery scheduled for 10/01/2020 @1330  @[facility] , [address] arrive at 12:00pm NPO after midnight, no lotion no powder, no perfume, little bit of deodorant is okay. Remove all jewelery  Have someone available to drive you  Need to be COVID tested today at .  Advised patient to call back to confirm receipt of this message.

## 2020-09-30 NOTE — Discharge Instructions (Signed)
Return to MAU:  If you have heavier bleeding that soaks through more that 2 pads per hour for an hour or more  If you bleed so much that you feel like you might pass out or you do pass out  If you have significant abdominal pain that is not improved with Tylenol 1000 mg every 6 hours as needed for pain  If you develop a fever > 100.5   

## 2020-09-30 NOTE — MAU Provider Note (Signed)
History     CSN: [id number]  Arrival date and time: 09/29/20 2324   First Provider Initiated Contact with Patient 09/30/20 0100      Chief Complaint  Patient presents with  . Abdominal Pain   Patricia Horton is a 24 y.o. year old G5P0010 female at [redacted]w[redacted]d weeks gestation who presents to MAU reporting lower mid-abdominal pain, RT lower back and RT flank pain for 2 weeks, but worse today. She has had N/V; vomited 4 x/24 hrs. She reports no abnormal vaginal discharge, but it "has a different odor to it." She reports some dyspareunia about 1 week ago. She has not had SI since then. She denies any dysuria, frequency or blood in urine. She does, however, report "discomfort down there" with wiping. Her OB hx has been uncomplicated. She receives her [facility] at [facility]. She has had a comfirmatory ul;trasound in that office on 09/16/2020. Her next OB appt is 10/08/2020.   OB History    Gravida  2   Para  0   Term  0   Preterm  0   AB  1   Living  0     SAB  1   TAB  0   Ectopic  0   Multiple  0   Live Births  0           Past Medical History:  Diagnosis Date  . Migraine with aura 2014   NO ESTROGEN  . Seizures ([facility])     History reviewed. No pertinent surgical history.  Family History  Problem Relation Age of Onset  . Cancer Paternal Grandmother        breast  . Heart disease Paternal Grandmother   . Diabetes Mother   . Stroke Mother   . Hypertension Mother   . Heart disease Paternal Aunt     Social History   Tobacco Use  . Smoking status: Never Smoker  . Smokeless tobacco: Never Used  Vaping Use  . Vaping Use: Never used  Substance Use Topics  . Alcohol use: No  . Drug use: No    Allergies: No Known Allergies  Medications Prior to Admission  Medication Sig Dispense Refill Last Dose  . Doxylamine-Pyridoxine (DICLEGIS) 10-10 MG TBEC 2 tabs q hs, if sx persist add 1 tab q am on day 3, if sx persist add 1 tab q afternoon on day 4 100 tablet 6  09/29/2020 at Unknown time  . Prenat w/o A-[id number] (CITRANATAL ASSURE) 35-1 & 300 MG tablet One tablet and one capsule daily 30 each 11 09/29/2020 at Unknown time    Review of Systems  Constitutional: Negative.   HENT: Negative.   Eyes: Negative.   Respiratory: Negative.   Cardiovascular: Negative.   Gastrointestinal: Positive for nausea (none now) and vomiting (4x/24 hrs; none now).  Endocrine: Negative.   Genitourinary: Positive for flank pain, vaginal discharge ("different odor") and vaginal pain (with any type of touch (like wiping or attempting SI) x 1 week). Negative for decreased urine volume, dysuria, enuresis, frequency and urgency.  Skin: Negative.   Allergic/Immunologic: Negative.   Neurological: Negative.   Hematological: Negative.   Psychiatric/Behavioral: Negative.    Physical Exam   Blood pressure 114/66, pulse 87, temperature 98.6 F (37 C), temperature source Oral, resp. rate 18, height 5\' 4"  (1.626 m), weight 88 kg, last menstrual period 07/13/2020, SpO2 100 %.  Physical Exam Vitals and nursing note reviewed. Exam conducted with a chaperone present.  Constitutional:  Appearance: Normal appearance. She is obese.  HENT:     Head: Normocephalic and atraumatic.  Cardiovascular:     Rate and Rhythm: Normal rate.     Pulses: Normal pulses.  Pulmonary:     Effort: Pulmonary effort is normal.  Abdominal:     Palpations: Abdomen is soft.     Tenderness: There is abdominal tenderness (RLQ). There is right CVA tenderness.  Genitourinary:    Comments: Uterus: gravid, S>D, SE: cervix is smooth, pink, no lesions, small amt of thin, white vaginal d/c -- WP, GC/CT done, closed/long/firm, no CMT or friability, no adnexal tenderness - patient intolerant of bimanual exam, moderate midline pelvic tenderness with bimanual exam Musculoskeletal:     Cervical back: Normal range of motion.  Neurological:     Mental Status: She is alert.    FHTs by doppler: unable  to obtain with doppler d/t patient being in pain  MAU Course  Procedures  MDM CCUA CBC w/Diff Wet prep GC/CT -- Results pending  Renal U/S OB MFM Limited U/S Offered pain medication - pt declined @ 0120  Results for orders placed or performed during the hospital encounter of 09/29/20 (from the past 24 hour(s))  Wet prep, genital     Status: Abnormal   Collection Time: 09/30/20  1:12 AM  Result Value Ref Range   Yeast Wet Prep HPF POC NONE SEEN NONE SEEN   Trich, Wet Prep NONE SEEN NONE SEEN   Clue Cells Wet Prep HPF POC PRESENT (A) NONE SEEN   WBC, Wet Prep HPF POC MANY (A) NONE SEEN   Sperm NONE SEEN   CBC with Differential/Platelet     Status: Abnormal   Collection Time: 09/30/20  1:16 AM  Result Value Ref Range   WBC 10.2 4.0 - 10.5 K/uL   RBC 4.21 3.87 - 5.11 MIL/uL   Hemoglobin 11.3 (L) 12.0 - 15.0 g/dL   HCT 19.5 (L) 36 - 46 %   MCV 81.7 80.0 - 100.0 fL   MCH 26.8 26.0 - 34.0 pg   MCHC 32.8 30.0 - 36.0 g/dL   RDW 09.3 26.7 - 12.4 %   Platelets 308 150 - 400 K/uL   nRBC 0.0 0.0 - 0.2 %   Neutrophils Relative % 50 %   Neutro Abs 5.2 1.7 - 7.7 K/uL   Lymphocytes Relative 41 %   Lymphs Abs 4.2 (H) 0.7 - 4.0 K/uL   Monocytes Relative 7 %   Monocytes Absolute 0.7 0.1 - 1.0 K/uL   Eosinophils Relative 1 %   Eosinophils Absolute 0.1 0.0 - 0.5 K/uL   Basophils Relative 0 %   Basophils Absolute 0.0 0.0 - 0.1 K/uL   Immature Granulocytes 1 %   Abs Immature Granulocytes 0.05 0.00 - 0.07 K/uL  Urinalysis, Routine w reflex microscopic Urine, Clean Catch     Status: Abnormal   Collection Time: 09/30/20  2:11 AM  Result Value Ref Range   Color, Urine YELLOW YELLOW   APPearance HAZY (A) CLEAR   Specific Gravity, Urine 1.023 1.005 - 1.030   pH 6.0 5.0 - 8.0   Glucose, UA NEGATIVE NEGATIVE mg/dL   Hgb urine dipstick NEGATIVE NEGATIVE   Bilirubin Urine NEGATIVE NEGATIVE   Ketones, ur NEGATIVE NEGATIVE mg/dL   Protein, ur NEGATIVE NEGATIVE mg/dL   Nitrite NEGATIVE  NEGATIVE   Leukocytes,Ua NEGATIVE NEGATIVE    OB Limited Ultrasound CLINICAL DATA: Initial evaluation for acute right flank pain. Early pregnancy.  EXAM: TRANSVAGINAL OB ULTRASOUND  TECHNIQUE: Transvaginal ultrasound was performed for complete evaluation of the gestation as well as the maternal uterus, adnexal regions, and pelvic cul-de-sac.  COMPARISON: Previous ultrasound from 09/16/2020.  FINDINGS: Intrauterine gestational sac: Single  Yolk sac: Negative.  Embryo: Single  Cardiac Activity: Negative.  Heart Rate: Absent.  CRL: 35.3 mm 10 w 3 d [place] EDC: N/A  Subchorionic hemorrhage: None visualized.  Maternal uterus/adnexae: Ovaries are within normal limits bilaterally. No adnexal mass or free fluid.  IMPRESSION: 1. Single intrauterine pregnancy with internal embryo, crown-rump length measuring 35.3 mm, with no detectable cardiac activity. Findings meet definitive criteria for failed pregnancy. This follows SRU consensus guidelines: Diagnostic Criteria for Nonviable Pregnancy Early in the First Trimester. [facility] Mis J Med [phone]. 2. No other acute maternal uterine or adnexal abnormality identified.   Electronically Signed By: Rise Mu M.D. On: 09/30/2020 02:23   Renal Ultrasound CLINICAL DATA: Initial evaluation for acute right flank and pelvic pain with pressure. Currently pregnant.  EXAM: RENAL / URINARY TRACT ULTRASOUND COMPLETE  COMPARISON: None available.  FINDINGS: Right Kidney:  Renal measurements: 11.2 x 4.6 x 5.8 cm = volume: 155.6 mL. Renal echogenicity within normal limits. No nephrolithiasis or hydronephrosis. No focal renal mass.  Left Kidney:  Renal measurements: 10.6 x 4.6 x 4.5 cm = volume: 113.2 mL. Renal echogenicity within normal limits. No nephrolithiasis or hydronephrosis. No focal renal mass.  Bladder:  Appears normal for degree of bladder distention. Bilateral jets  are visualized.  Other:  None.  IMPRESSION: Normal renal ultrasound. No nephrolithiasis or hydronephrosis.   Electronically Signed By: Rise Mu M.D. On: 09/30/2020 02:16  *Consult with Dr. Glade Nurse @ [phone] - notified of patient's complaints, assessments, lab & U/S results, tx plan d/c home & get scheduled for D&E - ok to d/c home, agrees with plan -- order received: add a T&S   Assessment and Plan  Incomplete miscarriage  - Information provided on miscarriage - Comfort measures/gifts given - Patient allowed time to grieve alone and process information given. Allowed time to ask questions.   Abdominal pain during pregnancy in first trimester - Information provided on abdominal pain in pregnancy   Bacterial vaginosis - Information provided on BV - Rx for Metrogel 0.75% per vagina hs  - Discharge patient - Message sent to [facility] to get D&E scheduled for patient. Patient advised that we don't have access to the surgical schedule tonight, but she should expect a call tomorrow or Tuesday with surgery availabilities. - Patient verbalized an understanding of the plan of care and agrees.    Raelyn Mora, MSN, CNM 09/30/2020, 1:00 AM

## 2020-09-30 NOTE — ED Provider Notes (Signed)
[facility] EMERGENCY DEPARTMENT Provider Note   CSN: [id number] Arrival date & time: 09/29/20  2324     History Chief Complaint  Patient presents with  . Abdominal Pain    Patricia Horton is a 24 y.o. female.  The history is provided by the patient and medical records.  Abdominal Pain    24 year old G2P0 approximately [redacted] weeks gestation here with lower abdominal pain.  States this is been ongoing for few days now but has gotten a lot worse over the past 2 days.  States now is to the point where she is having trouble even standing upright.  She feels that low down in her pelvis and sometimes into the right flank.  She denies any dysuria, vaginal bleeding, or vaginal discharge.  She has continued to have some nausea and vomiting, last emesis just prior to arrival in ED.  States she had miscarriage earlier this year and this is how her symptoms began so she is concerned.  She was previously seeing Dr. Emelda Fear, believes she is being switched to Dr. Despina Hidden but has not seen him yet in clinic.  She is taking prenatals.  Past Medical History:  Diagnosis Date  . Migraine with aura 2014   NO ESTROGEN  . Seizures [facility])     Patient Active Problem List   Diagnosis Date Noted  . Nexplanon insertion 07/28/2016  . Migraine with aura 08/14/2014  . Nexplanon in place 03/01/2013  . Seizures (HCC) 03/01/2013    History reviewed. No pertinent surgical history.   OB History    Gravida  2   Para  0   Term  0   Preterm  0   AB  1   Living  0     SAB  1   TAB  0   Ectopic  0   Multiple  0   Live Births  0           Family History  Problem Relation Age of Onset  . Cancer Paternal Grandmother        breast  . Heart disease Paternal Grandmother   . Diabetes Mother   . Stroke Mother   . Hypertension Mother   . Heart disease Paternal Aunt     Social History   Tobacco Use  . Smoking status: Never Smoker  . Smokeless tobacco: Never Used  Vaping  Use  . Vaping Use: Never used  Substance Use Topics  . Alcohol use: No  . Drug use: No    Home Medications Prior to Admission medications   Medication Sig Start Date End Date Taking? Authorizing Provider  Doxylamine-Pyridoxine (DICLEGIS) 10-10 MG TBEC 2 tabs q hs, if sx persist add 1 tab q am on day 3, if sx persist add 1 tab q afternoon on day 4 08/28/20   Cheral Marker, CNM  Prenat w/o A-FeCbGl-DSS-FA-DHA [facility] ASSURE) 35-1 & 300 MG tablet One tablet and one capsule daily 08/28/20   Cheral Marker, CNM    Allergies    Patient has no known allergies.  Review of Systems   Review of Systems  Gastrointestinal: Positive for abdominal pain.  All other systems reviewed and are negative.   Physical Exam Updated Vital Signs BP 116/70 (BP Location: Left Arm)   Pulse 83   Temp 98.4 F (36.9 C) (Oral)   Resp 18   Ht 5\' 4"  (1.626 m)   Wt 88 kg   LMP 07/13/2020   SpO2 100%  BMI 33.30 kg/m   Physical Exam Vitals and nursing note reviewed.  Constitutional:      Appearance: She is well-developed.  HENT:     Head: Normocephalic and atraumatic.  Eyes:     Conjunctiva/sclera: Conjunctivae normal.     Pupils: Pupils are equal, round, and reactive to light.  Cardiovascular:     Rate and Rhythm: Normal rate and regular rhythm.     Heart sounds: Normal heart sounds.  Pulmonary:     Effort: Pulmonary effort is normal.     Breath sounds: Normal breath sounds.  Abdominal:     General: Bowel sounds are normal.     Palpations: Abdomen is soft.     Tenderness: There is no abdominal tenderness. There is no guarding or rebound.  Musculoskeletal:        General: Normal range of motion.     Cervical back: Normal range of motion.  Skin:    General: Skin is warm and dry.  Neurological:     Mental Status: She is alert and oriented to person, place, and time.     ED Results / Procedures / Treatments   Labs (all labs ordered are listed, but only abnormal results are  displayed) Labs Reviewed - No data to display  EKG None  Radiology No results found.  Procedures Procedures (including critical care time)  Medications Ordered in ED Medications - No data to display  ED Course  I have reviewed the triage vital signs and the nursing notes.  Pertinent labs & imaging results that were available during my care of the patient were reviewed by me and considered in my medical decision making (see chart for details).    MDM Rules/Calculators/A&P  24 year old G2, P0 approximately [redacted] weeks gestation here with lower abdominal cramping.  Suffered miscarriage in June of this year and symptoms are consistent with what she felt previously so was concerned.  She has not had any vaginal bleeding, dysuria, fever, or chills.  She is hemodynamically stable here in the ED.  Discussed with on-call OB/GYN, Dr. Vergie Living-- will transfer to [facility]U for further evaluation.  Final Clinical Impression(s) / ED Diagnoses Final diagnoses:  Abdominal pain during pregnancy in first trimester    Rx / DC Orders ED Discharge Orders    None       Garlon Hatchet, PA-C 09/30/20 0011    Mesner, Barbara Cower, MD 09/30/20 [phone]

## 2020-09-30 NOTE — ED Notes (Signed)
To mau

## 2020-10-01 ENCOUNTER — Ambulatory Visit (HOSPITAL_BASED_OUTPATIENT_CLINIC_OR_DEPARTMENT_OTHER)
Admission: RE | Admit: 2020-10-01 | Discharge: 2020-10-01 | Disposition: A | Discharge status: Home / Self Care | Payer: Medicaid Other | Attending: Obstetrics and Gynecology | Admitting: Obstetrics and Gynecology

## 2020-10-01 ENCOUNTER — Ambulatory Visit (HOSPITAL_BASED_OUTPATIENT_CLINIC_OR_DEPARTMENT_OTHER): Payer: Medicaid Other | Admitting: Anesthesiology

## 2020-10-01 ENCOUNTER — Encounter (HOSPITAL_BASED_OUTPATIENT_CLINIC_OR_DEPARTMENT_OTHER)
Admission: RE | Disposition: A | Discharge status: Home / Self Care | Payer: Self-pay | Source: Home / Self Care | Attending: Obstetrics and Gynecology

## 2020-10-01 ENCOUNTER — Other Ambulatory Visit: Payer: Self-pay

## 2020-10-01 ENCOUNTER — Encounter (HOSPITAL_BASED_OUTPATIENT_CLINIC_OR_DEPARTMENT_OTHER): Payer: Self-pay | Admitting: Obstetrics and Gynecology

## 2020-10-01 DIAGNOSIS — Z30430 Encounter for insertion of intrauterine contraceptive device: Secondary | ICD-10-CM | POA: Diagnosis not present

## 2020-10-01 DIAGNOSIS — G43109 Migraine with aura, not intractable, without status migrainosus: Secondary | ICD-10-CM | POA: Diagnosis not present

## 2020-10-01 DIAGNOSIS — O021 Missed abortion: Secondary | ICD-10-CM | POA: Diagnosis not present

## 2020-10-01 DIAGNOSIS — R569 Unspecified convulsions: Secondary | ICD-10-CM | POA: Diagnosis not present

## 2020-10-01 HISTORY — PX: DILATION AND EVACUATION: SHX1459

## 2020-10-01 LAB — SARS CORONAVIRUS 2 (TAT 6-24 HRS): SARS Coronavirus 2: NEGATIVE

## 2020-10-01 SURGERY — DILATION AND EVACUATION, UTERUS
Anesthesia: General | Site: Uterus

## 2020-10-01 MED ORDER — ONDANSETRON HCL 4 MG/2ML IJ SOLN
INTRAMUSCULAR | Status: AC
  Filled 2020-10-01: qty 2

## 2020-10-01 MED ORDER — PROMETHAZINE HCL 25 MG/ML IJ SOLN
6.2500 mg | INTRAMUSCULAR | Status: DC | PRN
  Administered 2020-10-01: 12.5 mg via INTRAVENOUS

## 2020-10-01 MED ORDER — LACTATED RINGERS IV SOLN: INTRAVENOUS | Status: DC

## 2020-10-01 MED ORDER — DOXYCYCLINE HYCLATE 100 MG IV SOLR
200.0000 mg | INTRAVENOUS | Status: AC
  Administered 2020-10-01: 200 mg via INTRAVENOUS
  Filled 2020-10-01: qty 200

## 2020-10-01 MED ORDER — PROMETHAZINE HCL 25 MG/ML IJ SOLN
INTRAMUSCULAR | Status: AC
  Filled 2020-10-01: qty 1

## 2020-10-01 MED ORDER — OXYCODONE HCL 5 MG PO TABS
5.0000 mg | ORAL_TABLET | ORAL | 0 refills | Status: DC | PRN
Start: 2020-10-01 — End: 2021-07-22

## 2020-10-01 MED ORDER — OXYCODONE HCL 5 MG/5ML PO SOLN: 5.0000 mg | Freq: Once | ORAL | Status: DC | PRN

## 2020-10-01 MED ORDER — SODIUM CHLORIDE 0.9 % IV SOLN
INTRAVENOUS | Status: AC
  Filled 2020-10-01 (×2): qty 100

## 2020-10-01 MED ORDER — POVIDONE-IODINE 10 % EX SWAB
2.0000 "application " | Freq: Once | CUTANEOUS | Status: AC
  Administered 2020-10-01: 2 via TOPICAL

## 2020-10-01 MED ORDER — ONDANSETRON HCL 4 MG/2ML IJ SOLN
INTRAMUSCULAR | Status: DC | PRN
  Administered 2020-10-01: 4 mg via INTRAVENOUS

## 2020-10-01 MED ORDER — SODIUM CHLORIDE (PF) 0.9 % IJ SOLN
INTRAMUSCULAR | Status: AC
  Filled 2020-10-01: qty 10

## 2020-10-01 MED ORDER — HYDROMORPHONE HCL 1 MG/ML IJ SOLN
INTRAMUSCULAR | Status: AC
  Filled 2020-10-01: qty 0.5

## 2020-10-01 MED ORDER — MEPERIDINE HCL 25 MG/ML IJ SOLN: 6.2500 mg | INTRAMUSCULAR | Status: DC | PRN

## 2020-10-01 MED ORDER — SOD CITRATE-CITRIC ACID 500-334 MG/5ML PO SOLN
30.0000 mL | ORAL | Status: AC
  Administered 2020-10-01: 30 mL via ORAL

## 2020-10-01 MED ORDER — DOCUSATE SODIUM 100 MG PO CAPS: 100.0000 mg | ORAL_CAPSULE | Freq: Two times a day (BID) | ORAL | 2 refills | Status: DC | PRN

## 2020-10-01 MED ORDER — PROPOFOL 10 MG/ML IV BOLUS
INTRAVENOUS | Status: DC | PRN
  Administered 2020-10-01: 200 mg via INTRAVENOUS

## 2020-10-01 MED ORDER — IBUPROFEN 600 MG PO TABS: 600.0000 mg | ORAL_TABLET | Freq: Four times a day (QID) | ORAL | 1 refills | Status: DC | PRN

## 2020-10-01 MED ORDER — RHO D IMMUNE GLOBULIN 1500 UNIT/2ML IJ SOSY
300.0000 ug | PREFILLED_SYRINGE | Freq: Once | INTRAMUSCULAR | Status: AC
  Administered 2020-10-01: 300 ug via INTRAVENOUS
  Filled 2020-10-01: qty 2

## 2020-10-01 MED ORDER — SOD CITRATE-CITRIC ACID 500-334 MG/5ML PO SOLN
ORAL | Status: AC
  Filled 2020-10-01: qty 15

## 2020-10-01 MED ORDER — OXYCODONE HCL 5 MG PO TABS: 5.0000 mg | ORAL_TABLET | Freq: Once | ORAL | Status: DC | PRN

## 2020-10-01 MED ORDER — DEXAMETHASONE SODIUM PHOSPHATE 10 MG/ML IJ SOLN
INTRAMUSCULAR | Status: AC
  Filled 2020-10-01: qty 1

## 2020-10-01 MED ORDER — HYDROMORPHONE HCL 1 MG/ML IJ SOLN
0.2500 mg | INTRAMUSCULAR | Status: DC | PRN
  Administered 2020-10-01: 0.5 mg via INTRAVENOUS

## 2020-10-01 MED ORDER — FENTANYL CITRATE (PF) 250 MCG/5ML IJ SOLN
INTRAMUSCULAR | Status: DC | PRN
  Administered 2020-10-01 (×4): 25 ug via INTRAVENOUS

## 2020-10-01 MED ORDER — FENTANYL CITRATE (PF) 100 MCG/2ML IJ SOLN
INTRAMUSCULAR | Status: AC
  Filled 2020-10-01: qty 2

## 2020-10-01 MED ORDER — PROPOFOL 10 MG/ML IV BOLUS
INTRAVENOUS | Status: AC
  Filled 2020-10-01: qty 20

## 2020-10-01 MED ORDER — LIDOCAINE 2% (20 MG/ML) 5 ML SYRINGE
INTRAMUSCULAR | Status: DC | PRN
  Administered 2020-10-01: 60 mg via INTRAVENOUS

## 2020-10-01 MED ORDER — MIDAZOLAM HCL 2 MG/2ML IJ SOLN
INTRAMUSCULAR | Status: AC
  Filled 2020-10-01: qty 2

## 2020-10-01 MED ORDER — MIDAZOLAM HCL 5 MG/5ML IJ SOLN
INTRAMUSCULAR | Status: DC | PRN
  Administered 2020-10-01: 2 mg via INTRAVENOUS

## 2020-10-01 MED ORDER — DEXAMETHASONE SODIUM PHOSPHATE 10 MG/ML IJ SOLN
INTRAMUSCULAR | Status: DC | PRN
  Administered 2020-10-01: 5 mg via INTRAVENOUS

## 2020-10-01 MED ORDER — LIDOCAINE 2% (20 MG/ML) 5 ML SYRINGE
INTRAMUSCULAR | Status: AC
  Filled 2020-10-01: qty 5

## 2020-10-01 MED ORDER — AMISULPRIDE (ANTIEMETIC) 5 MG/2ML IV SOLN: 10.0000 mg | Freq: Once | INTRAVENOUS | Status: DC | PRN

## 2020-10-01 SURGICAL SUPPLY — 23 items
CATH ROBINSON RED A/P 16FR (CATHETERS) ×3 IMPLANT
DECANTER SPIKE VIAL GLASS SM (MISCELLANEOUS) ×1 IMPLANT
GLOVE BIO SURGEON STRL SZ 6.5 (GLOVE) ×2 IMPLANT
GLOVE BIO SURGEONS STRL SZ 6.5 (GLOVE) ×1
GLOVE BIOGEL PI IND STRL 6.5 (GLOVE) ×1 IMPLANT
GLOVE BIOGEL PI IND STRL 7.0 (GLOVE) ×1 IMPLANT
GLOVE BIOGEL PI INDICATOR 6.5 (GLOVE) ×2
GLOVE BIOGEL PI INDICATOR 7.0 (GLOVE) ×2
GOWN STRL REUS W/ TWL LRG LVL3 (GOWN DISPOSABLE) ×2 IMPLANT
GOWN STRL REUS W/TWL LRG LVL3 (GOWN DISPOSABLE) ×6
HIBICLENS CHG 4% 4OZ BTL (MISCELLANEOUS) ×1 IMPLANT
KIT BERKELEY 1ST TRI 3/8 NO TR (MISCELLANEOUS) ×3 IMPLANT
KIT BERKELEY 1ST TRIMESTER 3/8 (MISCELLANEOUS) ×3 IMPLANT
NS IRRIG 1000ML POUR BTL (IV SOLUTION) ×3 IMPLANT
PACK VAGINAL MINOR WOMEN LF (CUSTOM PROCEDURE TRAY) ×3 IMPLANT
PAD OB MATERNITY 4.3X12.25 (PERSONAL CARE ITEMS) ×3 IMPLANT
PAD PREP 24X48 CUFFED NSTRL (MISCELLANEOUS) ×3 IMPLANT
SET BERKELEY SUCTION TUBING (SUCTIONS) ×3 IMPLANT
TOWEL GREEN STERILE FF (TOWEL DISPOSABLE) ×6 IMPLANT
VACURETTE 10 RIGID CVD (CANNULA) ×2 IMPLANT
VACURETTE 7MM CVD STRL WRAP (CANNULA) IMPLANT
VACURETTE 8 RIGID CVD (CANNULA) IMPLANT
VACURETTE 9 RIGID CVD (CANNULA) ×2 IMPLANT

## 2020-10-01 NOTE — Transfer of Care (Signed)
Immediate Anesthesia Transfer of Care Note  Patient: Patricia Horton  Procedure(s) Performed: DILATATION AND EVACUATION (N/A Uterus)  Patient Location: PACU  Anesthesia Type:General  Level of Consciousness: awake, alert , oriented and patient cooperative  Airway & Oxygen Therapy: Patient Spontanous Breathing and Patient connected to face mask oxygen  Post-op Assessment: Report given to RN, Post -op Vital signs reviewed and stable and Patient moving all extremities  Post vital signs: Reviewed and stable  Last Vitals:  Vitals Value Taken Time  BP 124/67 10/01/20 1520  Temp    Pulse 102 10/01/20 1522  Resp 24 10/01/20 1522  SpO2 100 % 10/01/20 1522    Last Pain:  Vitals:   10/01/20 1339  TempSrc: Oral  PainSc: 7       Patients Stated Pain Goal: 4 (10/01/20 1339)  Complications: No complications documented.

## 2020-10-01 NOTE — Op Note (Signed)
[name] PROCEDURE DATE:  10/01/2020  PREOPERATIVE DIAGNOSIS: missed abortion @ [redacted]w[redacted]d POSTOPERATIVE DIAGNOSIS: The same PROCEDURE: suction dilation and evacuation SURGEON:  Dr. Baldemar Lenis  INDICATIONS: 24 y.o.  G2P0010 presenting with bleeding s/p missed abortion, who desires surgical management.  Risks of surgery were discussed with the patient including but not limited to: bleeding which may require transfusion; infection which may require antibiotics; injury to uterus or surrounding organs; need for additional procedures including laparotomy or laparoscopy; possibility of intrauterine scarring which may impair future fertility; and other postoperative/anesthesia complications. Written informed consent was obtained.  ABO, Rh: --/--/O NEG (11/02 1345) CBC    Component Value Date/Time   WBC 10.2 09/30/2020 0116   RBC 4.21 09/30/2020 0116   HGB 11.3 (L) 09/30/2020 0116   HCT 34.4 (L) 09/30/2020 0116   PLT 308 09/30/2020 0116   MCV 81.7 09/30/2020 0116   MCH 26.8 09/30/2020 0116   MCHC 32.8 09/30/2020 0116   RDW 14.0 09/30/2020 0116   LYMPHSABS 4.2 (H) 09/30/2020 0116   MONOABS 0.7 09/30/2020 0116   EOSABS 0.1 09/30/2020 0116   BASOSABS 0.0 09/30/2020 0116     FINDINGS:   Significant amount of retained products of conception within uterus. Empty uterus at the end of the procedure.   ANESTHESIA:   General anesthesia INTRAVENOUS FLUIDS:   LR ESTIMATED BLOOD LOSS:  75 mL URINE OUTPUT: unmeasured clear yellow urine SPECIMENS:  Products of conception sent to pathology COMPLICATIONS:  None immediate.  PROCEDURE DETAILS:  The patient received intravenous Doxycycline while in the preoperative area.  She was then taken to the operating room where anesthesia was administered and was found to be adequate.  After an adequate timeout was performed, she was placed in the dorsal lithotomy position and examined; then prepped and draped in the sterile manner.   Her bladder was  catheterized for return of clear, yellow urine. A vaginal speculum was then placed in the patient's vagina and a single tooth tenaculum was applied to the anterior lip of the cervix.  The cervix was gently dilated to accommodate a 9 mm suction curette. The suction curette was gently advanced to the uterine fundus and activated. The curette was slowly rotated to clear the uterus of products of conception.  This was repeated until the endometrial cavity was cleared. A sharp curettage was then performed to confirm complete emptying of the uterus. The suction curette was advanced to the fundus and activated one last time and removed and the procedure was finished. There was minimal bleeding noted at the end of the procedure, and the tenaculum removed with good hemostasis noted with pressure.  All instruments were removed from the patient's vagina.  Sponge and instrument counts were correct times three.  The patient tolerated the procedure well and was taken to the recovery area awake, extubated and in stable condition.  The patient will be discharged to home as per PACU criteria.  Routine postoperative instructions given.  She was prescribed Percocet, Ibuprofen and Colace.  She will follow up in the clinic in 2-3 weeks for postoperative evaluation.   Baldemar Lenis, M.D. Attending Center for [employer] [facility])

## 2020-10-01 NOTE — Anesthesia Procedure Notes (Signed)
Procedure Name: LMA Insertion Date/Time: 10/01/2020 2:47 PM Performed by: Lucinda Dell, CRNA Pre-anesthesia Checklist: Patient identified, Emergency Drugs available, Suction available and Patient being monitored Patient Re-evaluated:Patient Re-evaluated prior to induction Oxygen Delivery Method: Circle system utilized Preoxygenation: Pre-oxygenation with 100% oxygen Induction Type: IV induction Ventilation: Mask ventilation without difficulty LMA: LMA inserted LMA Size: 4.0 Tube type: Oral Number of attempts: 1 Placement Confirmation: positive ETCO2 and breath sounds checked- equal and bilateral Tube secured with: Tape Dental Injury: Teeth and Oropharynx as per pre-operative assessment

## 2020-10-01 NOTE — Anesthesia Preprocedure Evaluation (Signed)
Anesthesia Evaluation  Patient identified by MRN, date of birth, ID band Patient awake    Reviewed: Allergy & Precautions, NPO status , Patient's Chart, lab work & pertinent test results  Airway Mallampati: II  TM Distance: >3 FB Neck ROM: Full    Dental no notable dental hx.    Pulmonary neg pulmonary ROS,    Pulmonary exam normal breath sounds clear to auscultation       Cardiovascular negative cardio ROS Normal cardiovascular exam Rhythm:Regular Rate:Normal     Neuro/Psych  Headaches, Seizures -,  negative psych ROS   GI/Hepatic negative GI ROS, Neg liver ROS,   Endo/Other  negative endocrine ROS  Renal/GU negative Renal ROS  negative genitourinary   Musculoskeletal negative musculoskeletal ROS (+)   Abdominal (+) + obese,   Peds negative pediatric ROS (+)  Hematology negative hematology ROS (+)   Anesthesia Other Findings   Reproductive/Obstetrics negative OB ROS                             Anesthesia Physical Anesthesia Plan  ASA: II  Anesthesia Plan: General   Post-op Pain Management:    Induction: Intravenous  PONV Risk Score and Plan: 3 and Ondansetron, Dexamethasone, Midazolam and Treatment may vary due to age or medical condition  Airway Management Planned: LMA  Additional Equipment:   Intra-op Plan:   Post-operative Plan: Extubation in OR  Informed Consent: I have reviewed the patients History and Physical, chart, labs and discussed the procedure including the risks, benefits and alternatives for the proposed anesthesia with the patient or authorized representative who has indicated his/her understanding and acceptance.     Dental advisory given  Plan Discussed with: CRNA  Anesthesia Plan Comments:         Anesthesia Quick Evaluation

## 2020-10-01 NOTE — Anesthesia Postprocedure Evaluation (Signed)
Anesthesia Post Note  Patient: Patricia Horton  Procedure(s) Performed: DILATATION AND EVACUATION (N/A Uterus)     Patient location during evaluation: PACU Anesthesia Type: General Level of consciousness: awake and alert Pain management: pain level controlled Vital Signs Assessment: post-procedure vital signs reviewed and stable Respiratory status: spontaneous breathing, nonlabored ventilation and respiratory function stable Cardiovascular status: blood pressure returned to baseline and stable Postop Assessment: no apparent nausea or vomiting Anesthetic complications: no   No complications documented.  Last Vitals:  Vitals:   10/01/20 1600 10/01/20 1615  BP: 125/74 121/75  Pulse: 98 91  Resp: 20 17  Temp:    SpO2: 100% 100%    Last Pain:  Vitals:   10/01/20 1615  TempSrc:   PainSc: 4                  Lowella Curb

## 2020-10-01 NOTE — H&P (Addendum)
OB/GYN Pre-Op History and Physical  Patricia Horton is a 24 y.o. G2P0010 at [redacted]w[redacted]d presenting for surgical management of missed abortion. She had gone to MAU for pelvic/back pain intermittently which has now improved. No bleeding. No heartbeat seen on [place]. Opts for surgical management. No acute issues today although she is understandably grieving.       Past Medical History:  Diagnosis Date  . Migraine with aura 2014   NO ESTROGEN  . Seizures (HCC)    last seizure 2017    Past Surgical History:  Procedure Laterality Date  . NO PAST SURGERIES      OB History  Gravida Para Term Preterm AB Living  2 0 0 0 1 0  SAB TAB Ectopic Multiple Live Births  1 0 0 0 0    # Outcome Date GA Lbr Len/2nd Weight Sex Delivery Anes PTL Lv  2 Current           1 SAB             Social History   Socioeconomic History  . Marital status: Significant Other    Spouse name: Not on file  . Number of children: Not on file  . Years of education: Not on file  . Highest education level: Not on file  Occupational History  . Not on file  Tobacco Use  . Smoking status: Never Smoker  . Smokeless tobacco: Never Used  Vaping Use  . Vaping Use: Never used  Substance and Sexual Activity  . Alcohol use: No  . Drug use: No  . Sexual activity: Yes    Birth control/protection: None  Other Topics Concern  . Not on file  Social History Narrative  . Not on file   Social Determinants of Health   Financial Resource Strain:   . Difficulty of Paying Living Expenses: Not on file  Food Insecurity:   . Worried About Programme researcher, broadcasting/film/video in the Last Year: Not on file  . Ran Out of Food in the Last Year: Not on file  Transportation Needs:   . Lack of Transportation (Medical): Not on file  . Lack of Transportation (Non-Medical): Not on file  Physical Activity:   . Days of Exercise per Week: Not on file  . Minutes of Exercise per Session: Not on file  Stress:   . Feeling of Stress : Not on file  Social  Connections:   . Frequency of Communication with Friends and Family: Not on file  . Frequency of Social Gatherings with Friends and Family: Not on file  . Attends Religious Services: Not on file  . Active Member of Clubs or Organizations: Not on file  . Attends Banker Meetings: Not on file  . Marital Status: Not on file    Family History  Problem Relation Age of Onset  . Cancer Paternal Grandmother        breast  . Heart disease Paternal Grandmother   . Diabetes Mother   . Stroke Mother   . Hypertension Mother   . Heart disease Paternal Aunt     Medications Prior to Admission  Medication Sig Dispense Refill Last Dose  . Doxylamine-Pyridoxine (DICLEGIS) 10-10 MG TBEC 2 tabs q hs, if sx persist add 1 tab q am on day 3, if sx persist add 1 tab q afternoon on day 4 100 tablet 6 09/30/2020 at Unknown time  . Prenat w/o A-FeCbGl-DSS-FA-DHA (CITRANATAL ASSURE) 35-1 & 300 MG tablet One tablet and one  capsule daily 30 each 11 Past Week at Unknown time  . metroNIDAZOLE (METROGEL VAGINAL) 0.75 % vaginal gel Place 1 Applicatorful vaginally at bedtime for 5 days. 50 g 0   . ondansetron (ZOFRAN ODT) 4 MG disintegrating tablet Take 1 tablet (4 mg total) by mouth every 8 (eight) hours as needed for nausea or vomiting. 15 tablet 0   . oxyCODONE-acetaminophen (PERCOCET/ROXICET) 5-325 MG tablet Take 2 tablets by mouth every 4 (four) hours as needed for severe pain. 8 tablet 0     No Known Allergies  Review of Systems: Negative except for what is mentioned in HPI.     Physical Exam: BP 120/66   Pulse 82   Temp 98.4 F (36.9 C) (Oral)   Resp 18   Ht 5\' 4"  (1.626 m)   Wt 89.3 kg   LMP 07/13/2020   SpO2 100%   BMI 33.79 kg/m  CONSTITUTIONAL: Well-developed, well-nourished female in no acute distress.  HENT:  Normocephalic, atraumatic, External right and left ear normal. Oropharynx is clear and moist EYES: Conjunctivae and EOM are normal. Pupils are equal, round, and reactive to  light. No scleral icterus.  NECK: Normal range of motion, supple, no masses SKIN: Skin is warm and dry. No rash noted. Not diaphoretic. No erythema. No pallor. NEUROLGIC: Alert and oriented to person, place, and time. Normal reflexes, muscle tone coordination. No cranial nerve deficit noted. PSYCHIATRIC: Normal mood and affect. Normal behavior. Normal judgment and thought content. CARDIOVASCULAR: Normal heart rate noted RESPIRATORY: Effort normal, no problems with respiration noted ABDOMEN: Soft, nontender, nondistended,  PELVIC: Deferred MUSCULOSKELETAL: Normal range of motion. No edema and no tenderness. 2+ distal pulses.   Pertinent Labs/Studies:   Results for orders placed or performed during the hospital encounter of 09/30/20 (from the past 72 hour(s))  SARS CORONAVIRUS 2 (TAT 6-24 HRS) Nasopharyngeal Nasopharyngeal Swab     Status: None   Collection Time: 09/30/20  2:38 PM   Specimen: Nasopharyngeal Swab  Result Value Ref Range   SARS Coronavirus 2 NEGATIVE NEGATIVE    Comment: (NOTE) SARS-CoV-2 target nucleic acids are NOT DETECTED.  The SARS-CoV-2 RNA is generally detectable in upper and lower respiratory specimens during the acute phase of infection. Negative results do not preclude SARS-CoV-2 infection, do not rule out co-infections with other pathogens, and should not be used as the sole basis for treatment or other patient management decisions. Negative results must be combined with clinical observations, patient history, and epidemiological information. The expected result is Negative.  Fact Sheet for Patients: 13/01/21  Fact Sheet for Healthcare Providers: HairSlick.no  This test is not yet approved or cleared by the quierodirigir.com FDA and  has been authorized for detection and/or diagnosis of SARS-CoV-2 by FDA under an Emergency Use Authorization (EUA). This EUA will remain  in effect (meaning this  test can be used) for the duration of the COVID-19 declaration under Se ction 564(b)(1) of the Act, 21 U.S.C. section 360bbb-3(b)(1), unless the authorization is terminated or revoked sooner.  Performed at [facility], [address], [place], [place]    TVUS:  CLINICAL DATA:  Initial evaluation for acute right flank pain. Early pregnancy.  EXAM: TRANSVAGINAL OB ULTRASOUND  TECHNIQUE: Transvaginal ultrasound was performed for complete evaluation of the gestation as well as the maternal uterus, adnexal regions, and pelvic cul-de-sac.  COMPARISON:  Previous ultrasound from 09/16/2020.  FINDINGS: Intrauterine gestational sac: Single  Yolk sac:  Negative.  Embryo:  Single  Cardiac Activity:  Negative.  Heart Rate: Absent.  CRL: 35.3 mm   10 w 3 d                  [place] EDC: N/A  Subchorionic hemorrhage:  None visualized.  Maternal uterus/adnexae: Ovaries are within normal limits bilaterally. No adnexal mass or free fluid.  IMPRESSION: 1. Single intrauterine pregnancy with internal embryo, crown-rump length measuring 35.3 mm, with no detectable cardiac activity. Findings meet definitive criteria for failed pregnancy. This follows SRU consensus guidelines: Diagnostic Criteria for Nonviable Pregnancy Early in the First Trimester. Macy Mis J Med [phone]. 2. No other acute maternal uterine or adnexal abnormality identified.   Electronically Signed   By: Rise Mu M.D.   On: 09/30/2020 02:23     Assessment and Plan :Patricia Horton is a 24 y.o. G2P0010 here for suction dilation and evacuation/curettage for missed abortion at [redacted]w[redacted]d measuring [redacted]w[redacted]d. She is feeling well with no acute issues. Understandably upset and grieving, offered and patient accepts referral to behavioral health.  The risks of suction D&C were reviewed with the patient; including but not limited to: infection which may require antibiotics; bleeding which  may require transfusion or re-operation; injury to bowel, bladder, ureters or other surrounding organs; need for additional procedures including hysterectomy in the event of a life-threatening hemorrhage; placental abnormalities wth subsequent pregnancies, thromboembolic phenomenon and other postoperative/anesthesia complications. Reviewed that all sampling will be sent to pathology. The patient concurred with the proposed plan, giving informed consent for the procedure. She is agreeable to a blood transfusion in the event of emergency.  Patient is NPO Anesthesia aware Preoperative prophylactic antibiotics ordered SCDs  Admission labs To OR when ready Rh neg - to get Rho gam     Baldemar Lenis, M.D. Attending Obstetrician & Gynecologist, [facility] for [employer], [facility]

## 2020-10-01 NOTE — Brief Op Note (Signed)
10/01/2020  3:15 PM  PATIENT:  Patricia Horton  24 y.o. female  PRE-OPERATIVE DIAGNOSIS:  Missed AB  POST-OPERATIVE DIAGNOSIS:  Missed AB  PROCEDURE:  Procedure(s): DILATATION AND EVACUATION (N/A)  SURGEON:  Surgeon(s) and Role:    * Conan Bowens, MD - Primary  PHYSICIAN ASSISTANT:   ASSISTANTS: none   ANESTHESIA:   general  EBL:  75 mL   BLOOD ADMINISTERED:none  DRAINS: none   LOCAL MEDICATIONS USED:  NONE  SPECIMEN:  Source of Specimen:  products of concpetion  DISPOSITION OF SPECIMEN:  PATHOLOGY  COUNTS:  YES  TOURNIQUET:  * No tourniquets in log *  DICTATION: .Note written in EPIC  PLAN OF CARE: Discharge to home after PACU  PATIENT DISPOSITION:  PACU - hemodynamically stable.   Delay start of Pharmacological VTE agent (>24hrs) due to surgical blood loss or risk of bleeding: no

## 2020-10-01 NOTE — Discharge Instructions (Signed)
Take Ibuprofen scheduled for 3 days, 600 mg every 6 hours. Take the oxycodone as needed for breakthrough pain.    Dilation and Curettage, Care After These instructions give you information about caring for yourself after your procedure. Your doctor may also give you more specific instructions. Call your doctor if you have any problems or questions after your procedure. Follow these instructions at home: Activity  Do not drive or use heavy machinery while taking prescription pain medicine.  For 24 hours after your procedure, avoid driving.  Take short walks often, followed by rest periods. Ask your doctor what activities are safe for you. After one or two days, you may be able to return to your normal activities.  Do not lift anything that is heavier than 10 lb (4.5 kg) until your doctor approves.  For at least 2 weeks, or as long as told by your doctor: ? Do not douche. ? Do not use tampons. ? Do not have sex. General instructions   Take over-the-counter and prescription medicines only as told by your doctor. This is very important if you take blood thinning medicine.  Do not take baths, swim, or use a hot tub until your doctor approves. Take showers instead of baths.  Wear compression stockings as told by your doctor.  It is up to you to get the results of your procedure. Ask your doctor when your results will be ready.  Keep all follow-up visits as told by your doctor. This is important. Contact a doctor if:  You have very bad cramps that get worse or do not get better with medicine.  You have very bad pain in your belly (abdomen).  You cannot drink fluids without throwing up (vomiting).  You get pain in a different part of the area between your belly and thighs (pelvis).  You have bad-smelling discharge from your vagina.  You have a rash. Get help right away if:  You are bleeding a lot from your vagina. A lot of bleeding means soaking more than one sanitary pad in an  hour, for 2 hours in a row.  You have clumps of blood (blood clots) coming from your vagina.  You have a fever or chills.  Your belly feels very tender or hard.  You have chest pain.  You have trouble breathing.  You cough up blood.  You feel dizzy.  You feel light-headed.  You pass out (faint).  You have pain in your neck or shoulder area. Summary  Take short walks often, followed by rest periods. Ask your doctor what activities are safe for you. After one or two days, you may be able to return to your normal activities.  Do not lift anything that is heavier than 10 lb (4.5 kg) until your doctor approves.  Do not take baths, swim, or use a hot tub until your doctor approves. Take showers instead of baths.  Contact your doctor if you have any symptoms of infection, like bad-smelling discharge from your vagina. This information is not intended to replace advice given to you by your health care provider. Make sure you discuss any questions you have with your health care provider. Document Revised: 10/29/2017 Document Reviewed: 08/03/2016 Elsevier Patient Education  2020 Elsevier Inc.    Rh Incompatibility Rh incompatibility is a condition that occurs during pregnancy if a woman has Rh-negative blood and her baby has Rh-positive blood. "Rh-negative" and "Rh-positive" refer to whether or not the blood has a specific protein found on the surface of  red blood cells (Rh factor). If a woman has Rh factor, she is Rh-positive. If she does not have an Rh factor, she is Rh-negative. Having or not having an Rh factor does not affect the mother's general health. However, it can cause problems during pregnancy. What kind of problems can Rh incompatibility cause? During pregnancy, blood from the baby can cross into the mother's bloodstream, especially during delivery. If a mother is Rh-negative and the baby is Rh-positive, the mother's defense (immune) system will react to the baby's blood as  if it were a foreign substance and will create certain proteins (antibodies) in response to it. This process is called sensitization. Once the mother is sensitized, her Rh antibodies will cross the placenta in future pregnancies and attack the baby's Rh-positive blood as if it were a harmful substance. Rh incompatibility can also happen if a pregnant woman who is Rh-negative receives a donation (transfusion) of Rh-positive blood. How does this condition affect my baby? The Rh antibodies that attack and destroy your baby's red blood cells can lead to hemolytic disease in the baby. This is a condition in which red blood cells break down. This can cause:  Yellowing of the skin and the whites of the eyes (jaundice).  Fewer red blood cells in the body (anemia).  Brain damage.  Heart failure.  Death. These antibodies usually do not cause problems during a woman's first pregnancy. This is because blood from the baby often crosses into the mother's bloodstream during delivery, and the baby is born before many of the antibodies can develop. However, once antibodies have formed, they stay in a woman's body. Because of this, Rh incompatibility is more likely to cause problems in second or later pregnancies (if the baby is Rh-positive). How is this diagnosed?  When you become pregnant, you may have blood tests to determine your blood type and Rh factor. If you are Rh-negative, you may have another blood test called an antibody screen. The antibody screen shows whether you have Rh antibodies in your blood. If you do, it may mean that you have been exposed to Rh-positive blood before, and that you have a risk for Rh incompatibility. To find out whether your baby is developing hemolytic anemia and how serious it is, health care providers may use more advanced tests, such as ultrasound. How is Rh incompatibility treated? This condition is treated with two shots (injections) of medicine called Rho (D) immune  globulin. This medicine keeps your body from making antibodies that can cause serious problems for your baby or for future babies. You will get one shot around your seventh month of pregnancy and the other within 72 hours of your baby's birth. If you are Rh-negative, you will need this medicine every time you have a baby with Rh-positive blood. If you are Rh-negative and there is a high risk of blood transfer between you and your baby, you may be given Rho (D) immune globulin. The risk of blood transfer is high if you experience:  Amniocentesis. This is a procedure to remove a small amount of the fluid that surrounds a baby in the uterus (amniotic fluid) so that it can be tested.  A miscarriage or an abortion.  An ectopic pregnancy. This is a pregnancy in which the fertilized egg attaches (implants) outside the uterus.  Any vaginal bleeding during pregnancy. If you already have antibodies in your blood, your pregnancy will be closely monitored. You will not be given Rho (D) immune globulin because it is  not effective in these cases. Summary  Rh incompatibility is a condition that occurs during pregnancy if a woman has Rh-negative blood and her baby has Rh-positive blood.  If a mother is Rh-negative and the baby is Rh-positive, the mother's immune system will react to the baby's blood as if it were a foreign substance, and will create antibodies.  Rh antibodies that attack and destroy the baby's red blood cells can lead to hemolytic disease in the baby.  This condition is treated with a shot of medicine called Rho (D) immune globulin. This medicine keeps the woman's body from making antibodies that can cause serious problems in the baby or future babies. This information is not intended to replace advice given to you by your health care provider. Make sure you discuss any questions you have with your health care provider. Document Revised: 10/29/2017 Document Reviewed: 01/12/2017 Elsevier  Patient Education  2020 [employer].     Post Anesthesia Home Care Instructions  Activity: Get plenty of rest for the remainder of the day. A responsible individual must stay with you for 24 hours following the procedure.  For the next 24 hours, DO NOT: -Drive a car -Advertising copywriter -Drink alcoholic beverages -Take any medication unless instructed by your physician -Make any legal decisions or sign important papers.  Meals: Start with liquid foods such as gelatin or soup. Progress to regular foods as tolerated. Avoid greasy, spicy, heavy foods. If nausea and/or vomiting occur, drink only clear liquids until the nausea and/or vomiting subsides. Call your physician if vomiting continues.  Special Instructions/Symptoms: Your throat may feel dry or sore from the anesthesia or the breathing tube placed in your throat during surgery. If this causes discomfort, gargle with warm salt water. The discomfort should disappear within 24 hours.  If you had a scopolamine patch placed behind your ear for the management of post- operative nausea and/or vomiting:  1. The medication in the patch is effective for 72 hours, after which it should be removed.  Wrap patch in a tissue and discard in the trash. Wash hands thoroughly with soap and water. 2. You may remove the patch earlier than 72 hours if you experience unpleasant side effects which may include dry mouth, dizziness or visual disturbances. 3. Avoid touching the patch. Wash your hands with soap and water after contact with the patch.

## 2020-10-02 ENCOUNTER — Other Ambulatory Visit: Payer: Self-pay | Admitting: Obstetrics and Gynecology

## 2020-10-02 ENCOUNTER — Encounter (HOSPITAL_BASED_OUTPATIENT_CLINIC_OR_DEPARTMENT_OTHER): Payer: Self-pay | Admitting: Obstetrics and Gynecology

## 2020-10-02 LAB — RH IG WORKUP (INCLUDES ABO/RH)
ABO/RH(D): O NEG
Gestational Age(Wks): 11.2
Unit division: 0

## 2020-10-02 LAB — SURGICAL PATHOLOGY

## 2020-10-02 MED ORDER — DOXYCYCLINE HYCLATE 100 MG PO CAPS: 100.0000 mg | ORAL_CAPSULE | Freq: Two times a day (BID) | ORAL | 0 refills | Status: DC

## 2020-10-08 ENCOUNTER — Ambulatory Visit: Payer: Medicaid Other | Admitting: *Deleted

## 2020-10-08 ENCOUNTER — Encounter: Payer: Self-pay | Admitting: *Deleted

## 2020-10-08 ENCOUNTER — Other Ambulatory Visit: Payer: Medicaid Other

## 2020-10-08 ENCOUNTER — Encounter: Payer: Medicaid Other | Admitting: Women's Health

## 2020-10-16 ENCOUNTER — Other Ambulatory Visit: Payer: Self-pay

## 2020-10-16 ENCOUNTER — Encounter: Payer: Self-pay | Admitting: Obstetrics and Gynecology

## 2020-10-16 ENCOUNTER — Ambulatory Visit (INDEPENDENT_AMBULATORY_CARE_PROVIDER_SITE_OTHER): Payer: Medicaid Other | Admitting: Obstetrics and Gynecology

## 2020-10-16 DIAGNOSIS — Z3009 Encounter for other general counseling and advice on contraception: Secondary | ICD-10-CM

## 2020-10-16 DIAGNOSIS — Z9889 Other specified postprocedural states: Secondary | ICD-10-CM | POA: Diagnosis not present

## 2020-10-16 DIAGNOSIS — O021 Missed abortion: Secondary | ICD-10-CM

## 2020-10-16 MED ORDER — NORETHINDRONE 0.35 MG PO TABS: 1.0000 | ORAL_TABLET | Freq: Every day | ORAL | 3 refills | Status: DC

## 2020-10-16 NOTE — Progress Notes (Signed)
GYNECOLOGY OFFICE FOLLOW UP NOTE  History:  24 y.o. G2P0010 here today for follow up for suction D&C done on 10/01/20 for missed abortion at 11 weeks. She is doing well. Minimal pain after procedure that has now resolved. No bleeding. Appetite back to normal, no issues with bathroom use. Overall, feeling well.  Mentally, she is coping, talking helps but has times when she doesn't want to get out of bed or has crying spells.   Past Medical History:  Diagnosis Date  . Migraine with aura 2014   NO ESTROGEN  . Seizures (HCC)    last seizure 2017    Past Surgical History:  Procedure Laterality Date  . DILATION AND EVACUATION N/A 10/01/2020   Procedure: DILATATION AND EVACUATION;  Surgeon: Conan Bowens, MD;  Location: [facility];  Service: Gynecology;  Laterality: N/A;  . NO PAST SURGERIES       Current Outpatient Medications:  .  docusate sodium (COLACE) 100 MG capsule, Take 1 capsule (100 mg total) by mouth 2 (two) times daily as needed., Disp: 30 capsule, Rfl: 2 .  doxycycline (VIBRAMYCIN) 100 MG capsule, Take 1 capsule (100 mg total) by mouth 2 (two) times daily., Disp: 14 capsule, Rfl: 0 .  Doxylamine-Pyridoxine (DICLEGIS) 10-10 MG TBEC, 2 tabs q hs, if sx persist add 1 tab q am on day 3, if sx persist add 1 tab q afternoon on day 4, Disp: 100 tablet, Rfl: 6 .  ibuprofen (ADVIL) 600 MG tablet, Take 1 tablet (600 mg total) by mouth every 6 (six) hours as needed for headache, mild pain, moderate pain or cramping., Disp: 30 tablet, Rfl: 1 .  norethindrone (MICRONOR) 0.35 MG tablet, Take 1 tablet (0.35 mg total) by mouth daily., Disp: 84 tablet, Rfl: 3 .  ondansetron (ZOFRAN ODT) 4 MG disintegrating tablet, Take 1 tablet (4 mg total) by mouth every 8 (eight) hours as needed for nausea or vomiting., Disp: 15 tablet, Rfl: 0 .  oxyCODONE (ROXICODONE) 5 MG immediate release tablet, Take 1 tablet (5 mg total) by mouth every 4 (four) hours as needed for severe pain., Disp: 30  tablet, Rfl: 0 .  oxyCODONE-acetaminophen (PERCOCET/ROXICET) 5-325 MG tablet, Take 2 tablets by mouth every 4 (four) hours as needed for severe pain., Disp: 8 tablet, Rfl: 0 .  Prenat w/o A-FeCbGl-DSS-FA-DHA (CITRANATAL ASSURE) 35-1 & 300 MG tablet, One tablet and one capsule daily, Disp: 30 each, Rfl: 11  The following portions of the patient's history were reviewed and updated as appropriate: allergies, current medications, past family history, past medical history, past social history, past surgical history and problem list.   Review of Systems:  Pertinent items noted in HPI and remainder of comprehensive ROS otherwise negative.   Objective:  Physical Exam LMP 07/13/2020  CONSTITUTIONAL: Well-developed, well-nourished female in no acute distress.  HENT:  Normocephalic, atraumatic. External right and left ear normal. Oropharynx is clear and moist EYES: Conjunctivae and EOM are normal. Pupils are equal, round, and reactive to light. No scleral icterus.  NECK: Normal range of motion, supple, no masses SKIN: Skin is warm and dry. No rash noted. Not diaphoretic. No erythema. No pallor. NEUROLOGIC: Alert and oriented to person, place, and time. Normal reflexes, muscle tone coordination. No cranial nerve deficit noted. PSYCHIATRIC: Normal mood and affect. Normal behavior. Normal judgment and thought content. CARDIOVASCULAR: Normal heart rate noted RESPIRATORY: Effort normal, no problems with respiration noted ABDOMEN: Soft, no distention noted.  PELVIC: deferred MUSCULOSKELETAL: Normal range of motion.  No edema noted.  SURGICAL PATHOLOGY  CASE: [id number]  PATIENT: Patricia Horton  Surgical Pathology Report   Clinical History: missed AB (cm)   FINAL MICROSCOPIC DIAGNOSIS:   A. PRODUCTS OF CONCEPTION, DILATATION AND EVACUATION:  - Chorionic villi consistent with products of conception.   Assessment & Plan:  1. Missed abortion Physically doing well Appropriate grief, accepts  referral to [facility] - Ambulatory referral to [facility]  2. Postoperative state No issues  3. Encounter for counseling regarding contraception Would like POPs  Rx sent to pharmacy No CHC due to h/o migraine with aura   Routine preventative health maintenance measures emphasized. Please refer to After Visit Summary for other counseling recommendations.   Return in about 3 months (around 01/16/2021) for annual.  Total face-to-face time with patient: 22 minutes. Over 50% of encounter was spent on counseling and coordination of care.  Baldemar Lenis, M.D. Attending [facility] [employer] [facility])

## 2020-10-16 NOTE — Progress Notes (Signed)
GYN presents for Post Op FU of D&C.  C/o bleeding, pain 1-7/10.  Denies fever, chills.  GAD-7=14

## 2020-11-07 ENCOUNTER — Ambulatory Visit (HOSPITAL_COMMUNITY)
Admission: EM | Admit: 2020-11-07 | Discharge: 2020-11-07 | Disposition: A | Discharge status: Home / Self Care | Payer: Medicaid Other | Attending: Family Medicine | Admitting: Family Medicine

## 2020-11-07 ENCOUNTER — Encounter (HOSPITAL_COMMUNITY): Payer: Self-pay

## 2020-11-07 ENCOUNTER — Other Ambulatory Visit: Payer: Self-pay

## 2020-11-07 DIAGNOSIS — Z3201 Encounter for pregnancy test, result positive: Secondary | ICD-10-CM | POA: Insufficient documentation

## 2020-11-07 LAB — HCG, QUANTITATIVE, PREGNANCY: hCG, Beta Chain, Quant, S: 2 m[IU]/mL (ref <5)

## 2020-11-07 LAB — POC URINE PREG, ED: Preg Test, Ur: POSITIVE — AB

## 2020-11-07 NOTE — ED Triage Notes (Signed)
Pt presents with nausea and sensitivity to certain smells.Pt states she had these sxs during her last two pregnancies. She states she took 2 at home test that resulted positive. Pt states she currently feels nauseated. Pt states she had a recent pregnancy, but lost the baby at 11 weeks.

## 2020-11-07 NOTE — Discharge Instructions (Addendum)
There was a faint line on the pregnancy test here. We are drawing blood for HCG levels.  I will call with the results.  Otherwise see you OB for follow up.

## 2020-11-07 NOTE — ED Notes (Signed)
Pt states she is having lower back pain and a pulling stomach pain.

## 2020-11-08 NOTE — ED Provider Notes (Signed)
Patricia Horton    CSN: [id number] Arrival date & time: 11/07/20  1225      History   Chief Complaint Chief Complaint  Patient presents with  . Possible Pregnancy    HPI Patricia Horton is a 24 y.o. female.   Patient is a 24 year old female presents today with possible pregnancy.  She has had some nausea and sensitive to certain smells for the past few weeks.  History of similar symptoms with previous pregnancies.  Reported she took 2 pregnancy test at home that resulted as positive.  Currently she feels nauseated.  Patient with recent pregnancy but had miscarriage at 11 weeks. Had D&E.  Reporting had sexual intercourse soon after this procedure.  No dysuria, hematuria or urinary frequency.     Past Medical History:  Diagnosis Date  . Migraine with aura 2014   NO ESTROGEN  . Seizures (HCC)    last seizure 2017    Patient Active Problem List   Diagnosis Date Noted  . Nexplanon insertion 07/28/2016  . Migraine with aura 08/14/2014  . Nexplanon in place 03/01/2013  . Seizures (HCC) 03/01/2013    Past Surgical History:  Procedure Laterality Date  . DILATION AND EVACUATION N/A 10/01/2020   Procedure: DILATATION AND EVACUATION;  Surgeon: Conan Bowens, MD;  Location:  SUR[facility]ERY [facility];  Service: Gynecology;  Laterality: N/A;  . NO PAST SURGERIES      OB History    Gravida  2   Para  0   Term  0   Preterm  0   AB  2   Living  0     SAB  2   IAB  0   Ectopic  0   Multiple  0   Live Births  0            Home Medications    Prior to Admission medications   Medication Sig Start Date End Date Taking? Authorizing Provider  docusate sodium (COLACE) 100 MG capsule Take 1 capsule (100 mg total) by mouth 2 (two) times daily as needed. 10/01/20   Conan Bowens, MD  doxycycline (VIBRAMYCIN) 100 MG capsule Take 1 capsule (100 mg total) by mouth 2 (two) times daily. 10/02/20   Conan Bowens, MD  Doxylamine-Pyridoxine (DICLEGIS) 10-10 MG TBEC  2 tabs q hs, if sx persist add 1 tab q am on day 3, if sx persist add 1 tab q afternoon on day 4 08/28/20   Cheral Marker, CNM  ibuprofen (ADVIL) 600 MG tablet Take 1 tablet (600 mg total) by mouth every 6 (six) hours as needed for headache, mild pain, moderate pain or cramping. 10/01/20   Conan Bowens, MD  norethindrone (MICRONOR) 0.35 MG tablet Take 1 tablet (0.35 mg total) by mouth daily. 10/16/20   Conan Bowens, MD  ondansetron (ZOFRAN ODT) 4 MG disintegrating tablet Take 1 tablet (4 mg total) by mouth every 8 (eight) hours as needed for nausea or vomiting. 09/30/20   Raelyn Mora, CNM  oxyCODONE (ROXICODONE) 5 MG immediate release tablet Take 1 tablet (5 mg total) by mouth every 4 (four) hours as needed for severe pain. 10/01/20   Conan Bowens, MD  oxyCODONE-acetaminophen (PERCOCET/ROXICET) 5-325 MG tablet Take 2 tablets by mouth every 4 (four) hours as needed for severe pain. 09/30/20   Raelyn Mora, CNM  Prenat w/o A-FeCbGl-DSS-FA-DHA [facility] ASSURE) 35-1 & 300 MG tablet One tablet and one capsule daily 08/28/20   Cheral Marker,  CNM    Family History Family History  Problem Relation Age of Onset  . Cancer Paternal Grandmother        breast  . Heart disease Paternal Grandmother   . Diabetes Mother   . Stroke Mother   . Hypertension Mother   . Heart disease Paternal Aunt     Social History Social History   Tobacco Use  . Smoking status: Never Smoker  . Smokeless tobacco: Never Used  Vaping Use  . Vaping Use: Never used  Substance Use Topics  . Alcohol use: No  . Drug use: No     Allergies   Patient has no known allergies.   Review of Systems Review of Systems   Physical Exam Triage Vital Signs ED Triage Vitals  Enc Vitals Group     BP 11/07/20 1250 130/82     Pulse Rate 11/07/20 1250 72     Resp 11/07/20 1250 18     Temp 11/07/20 1250 98.8 F (37.1 C)     Temp Source 11/07/20 1250 Oral     SpO2 11/07/20 1250 98 %     Weight --       Height --      Head Circumference --      Peak Flow --      Pain Score 11/07/20 1330 0     Pain Loc --      Pain Edu? --      Excl. in GC? --    No data found.  Updated Vital Signs BP 130/82 (BP Location: Left Arm)   Pulse 72   Temp 98.8 F (37.1 C) (Oral)   Resp 18   LMP 07/13/2020   SpO2 98%   Visual Acuity Right Eye Distance:   Left Eye Distance:   Bilateral Distance:    Right Eye Near:   Left Eye Near:    Bilateral Near:     Physical Exam   UC Treatments / Results  Labs (all labs ordered are listed, but only abnormal results are displayed) Labs Reviewed  POC URINE PREG, ED - Abnormal; Notable for the following components:      Result Value   Preg Test, Ur POSITIVE (*)    All other components within normal limits  HCG, QUANTITATIVE, PREGNANCY    EKG   Radiology No results found.  Procedures Procedures (including critical care time)  Medications Ordered in UC Medications - No data to display  Initial Impression / Assessment and Plan / UC Course  I have reviewed the triage vital signs and the nursing notes.  Pertinent labs & imaging results that were available during my care of the patient were reviewed by me and considered in my medical decision making (see chart for details).  Clinical Course as of 11/08/20 1113  Fri Nov 08, 2020  1112 HCG, Beta Chain, Quant, S: 2 [TB]    Clinical Course User Index [TB] Kaylum Shrum A, NP    Positive hCG urine here today.  Very faint line.  Will draw blood for hCG levels. Recommend follow-up with OB  Blood hCG negative Final Clinical Impressions(s) / UC Diagnoses   Final diagnoses:  Positive pregnancy test     Discharge Instructions     There was a faint line on the pregnancy test here. We are drawing blood for HCG levels.  I will call with the results.  Otherwise see you OB for follow up.   ED Prescriptions    None  PDMP not reviewed this encounter.   Janace Aris, NP 11/08/20  1112

## 2020-11-09 ENCOUNTER — Telehealth: Payer: Medicaid Other | Admitting: Physician Assistant

## 2020-11-09 ENCOUNTER — Other Ambulatory Visit: Payer: Self-pay

## 2020-11-09 ENCOUNTER — Inpatient Hospital Stay (HOSPITAL_COMMUNITY)
Admission: AD | Admit: 2020-11-09 | Discharge: 2020-11-09 | Disposition: A | Discharge status: Home / Self Care | Payer: Medicaid Other | Attending: Obstetrics and Gynecology | Admitting: Obstetrics and Gynecology

## 2020-11-09 DIAGNOSIS — N939 Abnormal uterine and vaginal bleeding, unspecified: Secondary | ICD-10-CM | POA: Insufficient documentation

## 2020-11-09 DIAGNOSIS — O209 Hemorrhage in early pregnancy, unspecified: Secondary | ICD-10-CM

## 2020-11-09 LAB — POCT PREGNANCY, URINE: Preg Test, Ur: NEGATIVE

## 2020-11-09 NOTE — MAU Provider Note (Addendum)
Event Date/Time   First Provider Initiated Contact with Patient 11/09/20 1603      S [name] is a 24 y.o. G16P0020 non-pregnant female who presents to MAU today with complaint of vaginal bleeding. Patient states she's bleeding enough to wear a pad, but it's not abnormally heavy as compared to her regular menstrual bleeding. Pt had a SAB with D&C on 10/01/20 with follow up on 10/16/20. Pt began bleeding yesterday, got a +HPT then went to urgent care where she had another +UPT. Patient expressed anxiety about being pregnant due to her 2 previous SABs.    O Temp 98.1 F (36.7 C) (Oral)    Ht 5\' 4"  (1.626 m)    Wt 91.4 kg    LMP 07/13/2020    BMI 34.59 kg/m  Physical Exam Vitals and nursing note reviewed.  Constitutional:      General: She is not in acute distress.    Appearance: Normal appearance. She is normal weight. She is not ill-appearing.  HENT:     Mouth/Throat:     Mouth: Mucous membranes are moist.  Eyes:     Pupils: Pupils are equal, round, and reactive to light.  Cardiovascular:     Rate and Rhythm: Normal rate.     Pulses: Normal pulses.  Pulmonary:     Effort: Pulmonary effort is normal.  Musculoskeletal:        General: Normal range of motion.  Skin:    General: Skin is warm and dry.     Capillary Refill: Capillary refill takes less than 2 seconds.  Neurological:     General: No focal deficit present.     Mental Status: She is alert and oriented to person, place, and time.  Psychiatric:        Mood and Affect: Mood normal.        Behavior: Behavior normal.        Thought Content: Thought content normal.        Judgment: Judgment normal.    bHCG on 11/07/20 = 2 UPT in MAU negative  A Vaginal bleeding - menses resuming s/p suction D&C on 10/01/20  - explained to patient that +UPT in urgent care was likely a mistake given her hcg level. Offered to redraw hcg and confirm our negative UPT today, pt declined. Expressed relief at having confirmation of  returning menses Medical screening exam complete  P Discharge from MAU in stable condition Pt has follow up appt with Dr 13/02/21 in Feb 2022 Warning signs for worsening condition that would warrant emergency follow-up discussed Patient may return to MAU as needed for emergent OB/GYN related complaints  Mar 2022, Student-MidWife 11/09/2020 4:07 PM   Attestation of Supervision of Student:  I confirm that I have verified the information documented in the nurse midwife students note and that I have also personally performed the history, physical exam and all medical decision making activities.  I have verified that all services and findings are accurately documented in this student's note; and I agree with management and plan as outlined in the documentation. I have also made any necessary editorial changes.  14/09/2020, CNM [facility], [facility] 11/09/2020 5:20 PM

## 2020-11-09 NOTE — Progress Notes (Signed)
For the safety of you and your child, I recommend a face to face office visit with a health care provider.  After reviewing your E-Visit request, I recommend that you consult your OB/GYN for medical advice in relation to your condition. Please call your Ob-Gyn office to see if they have an after-hours nurse you can speak with to discuss what is going on. If you do not have the ability to reach them, I recommend going to urgent care or ER for further evaluation.  NOTE: If you entered your credit card information for this eVisit, you will not be charged. You may see a "hold" on your card for the $35 but that hold will drop off and you will not have a charge processed.  If you are having a true medical emergency please call 911.    For an urgent face to face visit, [place] has four urgent care centers for your convenience:   NEW:  [facility] [phone] [address] [place], [place] [address]   Monday - Friday 10 am - 6 pm      [facility]    [phone]                  Get Driving Directions  [address] [place], [place] [address]  10 am to 8 pm Monday-Friday  12 pm to 8 pm [facility] at [facility]  [phone]                  Get Driving Directions  [address] [place], [place] [address]  8 am to 8 pm Monday-Friday  9 am to 6 pm Saturday  11 am to 6 pm Sunday    [facility] at [facility]  [phone]                  Get Driving Directions   [address] [place], [place] [address]  8 am to 8 pm Monday-Friday  8 am to 4 pm [facility] at [facility] Directions  [phone]  [address] [place], [place] [address]   Monday-Friday, 12 PM to 6 PM  Your e-visit answers were reviewed by a board certified advanced clinical  practitioner to complete your personal care plan.  Thank you for using e-Visits.    Greater than 5 minutes, yet less than 10 minutes of time have been spent researching, coordinating, and implementing care for this patient today.   Jarold Motto, PA-C

## 2020-11-09 NOTE — [facility] Note (Addendum)
Pt reports vaginal bleeding that started today. Reports bleeding is lighter in amount that typical cycle. Denies cramping or pain.  PT reports having a D&C 11/2 at 10.4 weeks. She reported symptoms constant with pregnancy, performed home pregnancy test that was positive. She also had positive pregnancy test on 12/9 at [facility]. Urine pregnancy test today was negative.

## 2020-11-19 NOTE — BH Specialist Note (Deleted)
Integrated Behavioral Health via Telemedicine Visit  11/19/2020 Patricia Horton [id number]  Number of Integrated Behavioral Health visits: 1 Session Start time: 2:45***  Session End time: 3:45*** Total time: {IBH Total TUUE:28003491}  Referring Provider: Leroy Libman, MD Patient/Family location: Home*** [facility] Provider location: Center for [facility] at [facility]  All persons participating in visit: Patient *** and Patricia Horton ***  Types of Service: {CHL AMB TYPE OF SERVICE:[phone]}  I connected with Patricia Horton and/or Patricia Horton's {family members:20773} by Telephone***  (Video is Caregility application) and verified that I am speaking with the correct person using two identifiers.Discussed confidentiality: {YES/NO:21197}  I discussed the limitations of telemedicine and the availability of in person appointments.  Discussed there is a possibility of technology failure and discussed alternative modes of communication if that failure occurs.  I discussed that engaging in this telemedicine visit, they consent to the provision of behavioral healthcare and the services will be billed under their insurance.  Patient and/or legal guardian expressed understanding and consented to Telemedicine visit: {YES/NO:21197}  Presenting Concerns: Patient and/or family reports the following symptoms/concerns: *** Duration of problem: ***; Severity of problem: {Mild/Moderate/Severe:20260}  Patient and/or Family's Strengths/Protective Factors: {CHL AMB BH PROTECTIVE FACTORS:[phone]}  Goals Addressed: Patient will: 1.  Reduce symptoms of: {IBH Symptoms:21014056}  2.  Increase knowledge and/or ability of: {IBH Patient Tools:21014057}  3.  Demonstrate ability to: {IBH Goals:21014053}  Progress towards Goals: {CHL AMB BH PROGRESS TOWARDS [phone]}  Interventions: Interventions utilized:  {IBH Interventions:21014054} Standardized Assessments  completed: {IBH Screening Tools:21014051}  Patient and/or Family Response: ***  Assessment: Patient currently experiencing ***.   Patient may benefit from ***.  Plan: 1. Follow up with behavioral health clinician on : *** 2. Behavioral recommendations: *** 3. Referral(s): {IBH Referrals:21014055}  I discussed the assessment and treatment plan with the patient and/or parent/guardian. They were provided an opportunity to ask questions and all were answered. They agreed with the plan and demonstrated an understanding of the instructions.   They were advised to call back or seek an in-person evaluation if the symptoms worsen or if the condition fails to improve as anticipated.  Patricia Close [place], LCSW

## 2020-11-28 ENCOUNTER — Other Ambulatory Visit: Payer: Medicaid Other

## 2020-12-04 NOTE — BH Specialist Note (Deleted)
Integrated Behavioral Health Initial In-Person Visit  MRN: [id number] Name: Patricia Horton  Number of Integrated Behavioral Health Clinician visits:: {IBH Number of Visits:21014052} Session Start time: ***  Session End time: *** Total time: {IBH Total Time:21014050} minutes  Types of Service: {CHL AMB TYPE OF SERVICE:[phone]}  Interpretor:{yes QQ:761950} Interpretor Name and Language: ***   Warm Hand Off Completed.       Subjective: Patricia Horton is a 25 y.o. female accompanied by {CHL AMB ACCOMPANIED DT:[id number]} Patient was referred by *** for ***. Patient reports the following symptoms/concerns: *** Duration of problem: ***; Severity of problem: {Mild/Moderate/Severe:20260}  Objective: Mood: {BHH MOOD:22306} and Affect: {BHH AFFECT:22307} Risk of harm to self or others: {CHL AMB BH Suicide Current Mental Status:21022748}  Life Context: Family and Social: *** School/Work: *** Self-Care: *** Life Changes: ***  Patient and/or Family's Strengths/Protective Factors: {CHL AMB BH PROTECTIVE FACTORS:[phone]}  Goals Addressed: Patient will: 1. Reduce symptoms of: {IBH Symptoms:21014056} 2. Increase knowledge and/or ability of: {IBH Patient Tools:21014057}  3. Demonstrate ability to: {IBH Goals:21014053}  Progress towards Goals: {CHL AMB BH PROGRESS TOWARDS [phone]}  Interventions: Interventions utilized: {IBH Interventions:21014054}  Standardized Assessments completed: {IBH Screening Tools:21014051}  Patient and/or Family Response: ***  Patient Centered Plan: Patient is on the following Treatment Plan(s):  ***  Assessment: Patient currently experiencing ***.   Patient may benefit from ***.  Plan: 1. Follow up with behavioral health clinician on : *** 2. Behavioral recommendations: *** 3. Referral(s): {IBH Referrals:21014055} 4. "From scale of 1-10, how likely are you to follow plan?": ***  Rae Lips, LCSW   Depression screen [facility]  2/9 06/22/2013  Decreased Interest 2  Down, Depressed, Hopeless 0  PHQ - 2 Score 2  Altered sleeping 1  Tired, decreased energy 2  Change in appetite 0  Feeling bad or failure about yourself  0  Trouble concentrating 0  Moving slowly or fidgety/restless 0  Suicidal thoughts 0  PHQ-9 Score 5   GAD 7 : Generalized Anxiety Score 10/16/2020  Nervous, Anxious, on Edge 3  Control/stop worrying 3  Worry too much - different things 0  Trouble relaxing 2  Restless 0  Easily annoyed or irritable 3  Afraid - awful might happen 3  Total GAD 7 Score 14  Anxiety Difficulty Not difficult at all

## 2020-12-09 ENCOUNTER — Institutional Professional Consult (permissible substitution): Payer: Medicaid Other

## 2020-12-19 ENCOUNTER — Encounter (HOSPITAL_COMMUNITY): Payer: Self-pay

## 2020-12-19 ENCOUNTER — Ambulatory Visit (HOSPITAL_COMMUNITY)
Admission: EM | Admit: 2020-12-19 | Discharge: 2020-12-19 | Disposition: A | Discharge status: Home / Self Care | Payer: Medicaid Other | Attending: Emergency Medicine | Admitting: Emergency Medicine

## 2020-12-19 ENCOUNTER — Other Ambulatory Visit: Payer: Self-pay

## 2020-12-19 DIAGNOSIS — R112 Nausea with vomiting, unspecified: Secondary | ICD-10-CM | POA: Diagnosis not present

## 2020-12-19 LAB — POC URINE PREG, ED: Preg Test, Ur: NEGATIVE

## 2020-12-19 MED ORDER — ONDANSETRON HCL 4 MG PO TABS: 4.0000 mg | ORAL_TABLET | Freq: Four times a day (QID) | ORAL | 0 refills | Status: DC | PRN

## 2020-12-19 NOTE — Discharge Instructions (Signed)
Take the antinausea medication as directed.    Keep yourself hydrated with clear liquids, such as water, Gatorade, Pedialyte, Sprite, or ginger ale.    Go to the emergency department if you have acute worsening symptoms.    Follow up with your primary care provider if your symptoms are not improving.      

## 2020-12-19 NOTE — ED Provider Notes (Signed)
[facility]    CSN: [id number] Arrival date & time: 12/19/20  5284      History   Chief Complaint Chief Complaint  Patient presents with  . Emesis  . Nausea    HPI Patricia Horton is a 25 y.o. female.  Patient presents with 3-week history of nausea. She states she has had some emesis; last episode 4 days ago. She also reports headache intermittently x3 weeks; she has a history of migraines. Last bowel movement yesterday.  She denies fever, chills, abdominal pain, diarrhea, dysuria, vaginal discharge, pelvic pain, or other symptoms. Treatment attempted at home with ginger ale and saltine crackers. LMP 11/09/2020. Patient had an incomplete miscarriage on old 09/29/2020; she iwas seen by OB/GYN at [facility] on 11/09/2020; diagnosed with menses resuming s/p suction D&C on 10/01/2020. Patient's medical history includes migraines, seizures.  The history is provided by the patient and medical records.    Past Medical History:  Diagnosis Date  . Migraine with aura 2014   NO ESTROGEN  . Seizures (HCC)    last seizure 2017    Patient Active Problem List   Diagnosis Date Noted  . Nexplanon insertion 07/28/2016  . Migraine with aura 08/14/2014  . Nexplanon in place 03/01/2013  . Seizures (HCC) 03/01/2013    Past Surgical History:  Procedure Laterality Date  . DILATION AND EVACUATION N/A 10/01/2020   Procedure: DILATATION AND EVACUATION;  Surgeon: Conan Bowens, MD;  Location: [facility];  Service: Gynecology;  Laterality: N/A;  . NO PAST SURGERIES      OB History    Gravida  2   Para  0   Term  0   Preterm  0   AB  2   Living  0     SAB  2   IAB  0   Ectopic  0   Multiple  0   Live Births  0            Home Medications    Prior to Admission medications   Medication Sig Start Date End Date Taking? Authorizing Provider  ondansetron (ZOFRAN) 4 MG tablet Take 1 tablet (4 mg total) by mouth every 6 (six) hours as needed for nausea or  vomiting. 12/19/20  Yes Mickie Bail, NP  docusate sodium (COLACE) 100 MG capsule Take 1 capsule (100 mg total) by mouth 2 (two) times daily as needed. 10/01/20   Conan Bowens, MD  doxycycline (VIBRAMYCIN) 100 MG capsule Take 1 capsule (100 mg total) by mouth 2 (two) times daily. 10/02/20   Conan Bowens, MD  Doxylamine-Pyridoxine (DICLEGIS) 10-10 MG TBEC 2 tabs q hs, if sx persist add 1 tab q am on day 3, if sx persist add 1 tab q afternoon on day 4 08/28/20   Cheral Marker, CNM  ibuprofen (ADVIL) 600 MG tablet Take 1 tablet (600 mg total) by mouth every 6 (six) hours as needed for headache, mild pain, moderate pain or cramping. 10/01/20   Conan Bowens, MD  norethindrone (MICRONOR) 0.35 MG tablet Take 1 tablet (0.35 mg total) by mouth daily. 10/16/20   Conan Bowens, MD  oxyCODONE (ROXICODONE) 5 MG immediate release tablet Take 1 tablet (5 mg total) by mouth every 4 (four) hours as needed for severe pain. 10/01/20   Conan Bowens, MD  oxyCODONE-acetaminophen (PERCOCET/ROXICET) 5-325 MG tablet Take 2 tablets by mouth every 4 (four) hours as needed for severe pain. 09/30/20   Arita Miss,  Rolitta, CNM  Prenat w/o A-FeCbGl-DSS-FA-DHA (CITRANATAL ASSURE) 35-1 & 300 MG tablet One tablet and one capsule daily 08/28/20   Cheral Marker, CNM    Family History Family History  Problem Relation Age of Onset  . Cancer Paternal Grandmother        breast  . Heart disease Paternal Grandmother   . Diabetes Mother   . Stroke Mother   . Hypertension Mother   . Heart disease Paternal Aunt     Social History Social History   Tobacco Use  . Smoking status: Never Smoker  . Smokeless tobacco: Never Used  Vaping Use  . Vaping Use: Never used  Substance Use Topics  . Alcohol use: No  . Drug use: No     Allergies   Patient has no known allergies.   Review of Systems Review of Systems  Constitutional: Negative for chills and fever.  HENT: Negative for ear pain and sore throat.   Eyes: Negative  for pain and visual disturbance.  Respiratory: Negative for cough and shortness of breath.   Cardiovascular: Negative for chest pain and palpitations.  Gastrointestinal: Positive for nausea and vomiting. Negative for abdominal pain.  Genitourinary: Negative for dysuria and hematuria.  Musculoskeletal: Negative for arthralgias and back pain.  Skin: Negative for color change and rash.  Neurological: Positive for headaches. Negative for seizures, syncope and weakness.  All other systems reviewed and are negative.    Physical Exam Triage Vital Signs ED Triage Vitals  Enc Vitals Group     BP      Pulse      Resp      Temp      Temp src      SpO2      Weight      Height      Head Circumference      Peak Flow      Pain Score      Pain Loc      Pain Edu?      Excl. in GC?    No data found.  Updated Vital Signs BP 117/77 (BP Location: Left Arm)   Pulse 65   Temp 97.9 F (36.6 C) (Oral)   Resp 18   LMP 11/09/2020 (Exact Date)   SpO2 100%   Visual Acuity Right Eye Distance:   Left Eye Distance:   Bilateral Distance:    Right Eye Near:   Left Eye Near:    Bilateral Near:     Physical Exam Vitals and nursing note reviewed.  Constitutional:      General: She is not in acute distress.    Appearance: She is well-developed and well-nourished. She is not ill-appearing.  HENT:     Head: Normocephalic and atraumatic.     Mouth/Throat:     Mouth: Mucous membranes are moist.  Eyes:     Conjunctiva/sclera: Conjunctivae normal.  Cardiovascular:     Rate and Rhythm: Normal rate and regular rhythm.     Heart sounds: Normal heart sounds.  Pulmonary:     Effort: Pulmonary effort is normal. No respiratory distress.     Breath sounds: Normal breath sounds.  Abdominal:     General: Bowel sounds are normal.     Palpations: Abdomen is soft.     Tenderness: There is no abdominal tenderness. There is no right CVA tenderness, left CVA tenderness, guarding or rebound.   Musculoskeletal:        General: No edema.     Cervical  back: Neck supple.  Skin:    General: Skin is warm and dry.  Neurological:     General: No focal deficit present.     Mental Status: She is alert and oriented to person, place, and time.     Gait: Gait normal.  Psychiatric:        Mood and Affect: Mood and affect and mood normal.        Behavior: Behavior normal.      UC Treatments / Results  Labs (all labs ordered are listed, but only abnormal results are displayed) Labs Reviewed  POC URINE PREG, ED    EKG   Radiology No results found.  Procedures Procedures (including critical care time)  Medications Ordered in UC Medications - No data to display  Initial Impression / Assessment and Plan / UC Course  I have reviewed the triage vital signs and the nursing notes.  Pertinent labs & imaging results that were available during my care of the patient were reviewed by me and considered in my medical decision making (see chart for details).   Nausea with vomiting.  Urine pregnancy negative.  Treating with Zofran.  Instructed patient to stay hydrated with clear liquids.  Precautions for going to the ED if she has acute worsening symptoms discussed.  Instructed patient to follow-up with her PCP.  She agrees to plan of care.   Final Clinical Impressions(s) / UC Diagnoses   Final diagnoses:  Non-intractable vomiting with nausea, unspecified vomiting type     Discharge Instructions     Take the antinausea medication as directed.    Keep yourself hydrated with clear liquids, such as water, Gatorade, Pedialyte, Sprite, or ginger ale.    Go to the emergency department if you have acute worsening symptoms.    Follow up with your primary care provider if your symptoms are not improving.         ED Prescriptions    Medication Sig Dispense Auth. Provider   ondansetron (ZOFRAN) 4 MG tablet Take 1 tablet (4 mg total) by mouth every 6 (six) hours as needed for  nausea or vomiting. 12 tablet Mickie Bail, NP     PDMP not reviewed this encounter.   Mickie Bail, NP 12/19/20 1124

## 2020-12-19 NOTE — ED Triage Notes (Signed)
Pt c/o being nauseas x 3 weeks. She states she cannot keep food down except for crackers. She states every time she eats she vomits. Pt denies stomach pain. Pt states she has been having light vaginal bleeding x 4 days. She states she has irregular periods, and states the vaginal bleeding happening now is abnormal.

## 2021-02-13 ENCOUNTER — Other Ambulatory Visit: Payer: Medicaid Other | Admitting: Advanced Practice Midwife

## 2021-03-19 ENCOUNTER — Other Ambulatory Visit: Payer: Medicaid Other | Admitting: Advanced Practice Midwife

## 2021-04-15 ENCOUNTER — Other Ambulatory Visit: Payer: Self-pay | Admitting: Adult Health

## 2021-04-15 ENCOUNTER — Telehealth: Payer: Self-pay | Admitting: Women's Health

## 2021-04-15 DIAGNOSIS — N926 Irregular menstruation, unspecified: Secondary | ICD-10-CM

## 2021-04-15 NOTE — Telephone Encounter (Signed)
Pt states having preg symptoms - nausea, vomiting, insomnia, smell sensitivity States her cycle is 4 days late & home preg test was negative  States she's had 2 previous miscarriages  Is it too early for testing?  Can we order HCG?  Please advise & notify pt - pt prefers MyChart response due to she's at work

## 2021-04-15 NOTE — Progress Notes (Signed)
Ck QHCG order is in

## 2021-04-18 LAB — BETA HCG QUANT (REF LAB): hCG Quant: <1 m[IU]/mL

## 2021-06-02 ENCOUNTER — Encounter (HOSPITAL_COMMUNITY): Payer: Self-pay

## 2021-06-02 ENCOUNTER — Other Ambulatory Visit: Payer: Self-pay

## 2021-06-02 ENCOUNTER — Emergency Department (HOSPITAL_COMMUNITY)
Admission: EM | Admit: 2021-06-02 | Discharge: 2021-06-03 | Disposition: A | Discharge status: Home / Self Care | Payer: Medicaid Other | Attending: Emergency Medicine | Admitting: Emergency Medicine

## 2021-06-02 DIAGNOSIS — H6123 Impacted cerumen, bilateral: Secondary | ICD-10-CM | POA: Diagnosis not present

## 2021-06-02 DIAGNOSIS — R110 Nausea: Secondary | ICD-10-CM | POA: Diagnosis not present

## 2021-06-02 DIAGNOSIS — U071 COVID-19: Secondary | ICD-10-CM | POA: Diagnosis not present

## 2021-06-02 DIAGNOSIS — R109 Unspecified abdominal pain: Secondary | ICD-10-CM | POA: Diagnosis not present

## 2021-06-02 DIAGNOSIS — R509 Fever, unspecified: Secondary | ICD-10-CM

## 2021-06-02 DIAGNOSIS — R1110 Vomiting, unspecified: Secondary | ICD-10-CM | POA: Diagnosis not present

## 2021-06-02 DIAGNOSIS — R5081 Fever presenting with conditions classified elsewhere: Secondary | ICD-10-CM

## 2021-06-02 DIAGNOSIS — H53149 Visual discomfort, unspecified: Secondary | ICD-10-CM | POA: Insufficient documentation

## 2021-06-02 DIAGNOSIS — R1031 Right lower quadrant pain: Secondary | ICD-10-CM

## 2021-06-02 DIAGNOSIS — R519 Headache, unspecified: Secondary | ICD-10-CM | POA: Diagnosis not present

## 2021-06-02 LAB — CBC WITH DIFFERENTIAL/PLATELET
Abs Immature Granulocytes: 0.02 10*3/uL (ref 0.00–0.07)
Basophils Absolute: 0 10*3/uL (ref 0.0–0.1)
Basophils Relative: 1 %
Eosinophils Absolute: 0 10*3/uL (ref 0.0–0.5)
Eosinophils Relative: 0 %
HCT: 35.2 % — ABNORMAL LOW (ref 36.0–46.0)
Hemoglobin: 11.6 g/dL — ABNORMAL LOW (ref 12.0–15.0)
Immature Granulocytes: 0 %
Lymphocytes Relative: 11 %
Lymphs Abs: 0.6 10*3/uL — ABNORMAL LOW (ref 0.7–4.0)
MCH: 26.9 pg (ref 26.0–34.0)
MCHC: 33 g/dL (ref 30.0–36.0)
MCV: 81.5 fL (ref 80.0–100.0)
Monocytes Absolute: 0.6 10*3/uL (ref 0.1–1.0)
Monocytes Relative: 11 %
Neutro Abs: 4.5 10*3/uL (ref 1.7–7.7)
Neutrophils Relative %: 77 %
Platelets: 364 10*3/uL (ref 150–400)
RBC: 4.32 MIL/uL (ref 3.87–5.11)
RDW: 13.7 % (ref 11.5–15.5)
WBC: 5.8 10*3/uL (ref 4.0–10.5)
nRBC: 0 % (ref 0.0–0.2)

## 2021-06-02 MED ORDER — SODIUM CHLORIDE 0.9 % IV BOLUS
1000.0000 mL | Freq: Once | INTRAVENOUS | Status: AC
  Administered 2021-06-02: 1000 mL via INTRAVENOUS

## 2021-06-02 MED ORDER — ACETAMINOPHEN 325 MG PO TABS
650.0000 mg | ORAL_TABLET | Freq: Once | ORAL | Status: AC
  Administered 2021-06-02: 23:00:00 650 mg via ORAL
  Filled 2021-06-02: qty 2

## 2021-06-02 NOTE — ED Triage Notes (Signed)
Pt reports being outside and living in a new house with no AC. Also c/o abd pain, nausea, and vomiting.

## 2021-06-03 ENCOUNTER — Emergency Department (HOSPITAL_COMMUNITY): Payer: Medicaid Other

## 2021-06-03 DIAGNOSIS — R1031 Right lower quadrant pain: Secondary | ICD-10-CM | POA: Diagnosis not present

## 2021-06-03 DIAGNOSIS — R110 Nausea: Secondary | ICD-10-CM | POA: Diagnosis not present

## 2021-06-03 DIAGNOSIS — R1110 Vomiting, unspecified: Secondary | ICD-10-CM | POA: Diagnosis not present

## 2021-06-03 DIAGNOSIS — R519 Headache, unspecified: Secondary | ICD-10-CM | POA: Diagnosis not present

## 2021-06-03 DIAGNOSIS — R109 Unspecified abdominal pain: Secondary | ICD-10-CM | POA: Diagnosis not present

## 2021-06-03 DIAGNOSIS — U071 COVID-19: Secondary | ICD-10-CM | POA: Diagnosis not present

## 2021-06-03 DIAGNOSIS — R509 Fever, unspecified: Secondary | ICD-10-CM | POA: Diagnosis not present

## 2021-06-03 LAB — COMPREHENSIVE METABOLIC PANEL
ALT: 23 U/L (ref 0–44)
AST: 22 U/L (ref 15–41)
Albumin: 4 g/dL (ref 3.5–5.0)
Alkaline Phosphatase: 45 U/L (ref 38–126)
Anion gap: 7 (ref 5–15)
BUN: 14 mg/dL (ref 6–20)
CO2: 20 mmol/L — ABNORMAL LOW (ref 22–32)
Calcium: 9 mg/dL (ref 8.9–10.3)
Chloride: 104 mmol/L (ref 98–111)
Creatinine, Ser: 1.04 mg/dL — ABNORMAL HIGH (ref 0.44–1.00)
GFR, Estimated: >60 mL/min (ref >60)
Glucose, Bld: 118 mg/dL — ABNORMAL HIGH (ref 70–99)
Potassium: 3.3 mmol/L — ABNORMAL LOW (ref 3.5–5.1)
Sodium: 131 mmol/L — ABNORMAL LOW (ref 135–145)
Total Bilirubin: 0.3 mg/dL (ref 0.3–1.2)
Total Protein: 7.6 g/dL (ref 6.5–8.1)

## 2021-06-03 LAB — URINALYSIS, ROUTINE W REFLEX MICROSCOPIC
Bilirubin Urine: NEGATIVE
Glucose, UA: NEGATIVE mg/dL
Ketones, ur: NEGATIVE mg/dL
Leukocytes,Ua: NEGATIVE
Nitrite: NEGATIVE
Protein, ur: NEGATIVE mg/dL
Specific Gravity, Urine: 1.014 (ref 1.005–1.030)
pH: 9 — ABNORMAL HIGH (ref 5.0–8.0)

## 2021-06-03 LAB — RESP PANEL BY RT-PCR (FLU A&B, COVID) ARPGX2
Influenza A by PCR: NEGATIVE
Influenza B by PCR: NEGATIVE
SARS Coronavirus 2 by RT PCR: POSITIVE — AB

## 2021-06-03 LAB — CK: Total CK: 180 U/L (ref 38–234)

## 2021-06-03 LAB — HCG, SERUM, QUALITATIVE: Preg, Serum: NEGATIVE

## 2021-06-03 LAB — LACTIC ACID, PLASMA
Lactic Acid, Venous: 0.5 mmol/L (ref 0.5–1.9)
Lactic Acid, Venous: 0.6 mmol/L (ref 0.5–1.9)

## 2021-06-03 LAB — PREGNANCY, URINE: Preg Test, Ur: NEGATIVE

## 2021-06-03 MED ORDER — METOCLOPRAMIDE HCL 5 MG/ML IJ SOLN
10.0000 mg | Freq: Once | INTRAMUSCULAR | Status: AC
  Administered 2021-06-03: 10 mg via INTRAVENOUS
  Filled 2021-06-03: qty 2

## 2021-06-03 MED ORDER — PAXLOVID 10 X 150 MG & 10 X 100MG PO TBPK: 2.0000 | ORAL_TABLET | Freq: Two times a day (BID) | ORAL | 0 refills | Status: AC

## 2021-06-03 MED ORDER — DIPHENHYDRAMINE HCL 50 MG/ML IJ SOLN
25.0000 mg | Freq: Once | INTRAMUSCULAR | Status: AC
  Administered 2021-06-03: 25 mg via INTRAVENOUS
  Filled 2021-06-03: qty 1

## 2021-06-03 MED ORDER — KETOROLAC TROMETHAMINE 30 MG/ML IJ SOLN
30.0000 mg | Freq: Once | INTRAMUSCULAR | Status: AC
  Administered 2021-06-03: 30 mg via INTRAVENOUS
  Filled 2021-06-03: qty 1

## 2021-06-03 MED ORDER — ONDANSETRON 4 MG PO TBDP: 4.0000 mg | ORAL_TABLET | Freq: Three times a day (TID) | ORAL | 0 refills | Status: DC | PRN

## 2021-06-03 MED ORDER — SODIUM CHLORIDE 0.9 % IV BOLUS
1000.0000 mL | Freq: Once | INTRAVENOUS | Status: AC
  Administered 2021-06-03: 1000 mL via INTRAVENOUS

## 2021-06-03 MED ORDER — IOHEXOL 300 MG/ML  SOLN
100.0000 mL | Freq: Once | INTRAMUSCULAR | Status: AC | PRN
  Administered 2021-06-03: 100 mL via INTRAVENOUS

## 2021-06-03 NOTE — Discharge Instructions (Addendum)
Your COVID test is positive.  Keep yourself quarantined at home for a total of 14 days.  Use Tylenol or Motrin as needed for aches and fevers.  As we discussed, your appendix was not seen on CT scan today.  You should return to the ED if you develop worsening abdominal pain especially on the right side, persistent fever, persistent vomiting, unable to eat or drink worsening headache, or any other concerns.

## 2021-06-03 NOTE — ED Provider Notes (Signed)
[facility] EMERGENCY DEPARTMENT Provider Note   CSN: [id number] Arrival date & time: 06/02/21  2302     History Chief Complaint  Patient presents with   Dehydration    Patricia Horton is a 25 y.o. female.  Patient with history of "migraines" here with concern for dehydration, sinus congestion, rhinorrhea, body aches, abdominal pain, nausea and vomiting and headache.  States she had just moved into a new house about 4 days ago which does not have air conditioning.  She is concerned she could have dehydration or heat exhaustion.  States she developed a gradual onset headache about 2 days ago with frontal and maxillary sinus congestion, runny nose, body aches, abdominal pain, nausea and vomiting.  Her headache feels similar to her previous migraines and is gradual in onset.  Denies thunderclap onset.  Complains of diffuse abdominal pain worse in the right lower quadrant.  Had 2 episodes of vomiting.  No pain with urination or blood in the urine.  No diarrhea.  No significant cough, runny nose, sore throat.  No shortness of breath. Febrile to 103 on arrival.  Did not know she had a fever.  No sick contacts at home. Up-to-date on her vaccines.  She is COVID vaccinated. Does have photophobia and headache also has abdominal pain with vomiting that started the same time.  Has had similar symptoms in the past with her migraines but never had a vomiting or abdominal pain.  The history is provided by the patient.      Past Medical History:  Diagnosis Date   Migraine with aura 2014   NO ESTROGEN   Seizures (HCC)    last seizure 2017    Patient Active Problem List   Diagnosis Date Noted   Nexplanon insertion 07/28/2016   Migraine with aura 08/14/2014   Nexplanon in place 03/01/2013   Seizures (HCC) 03/01/2013    Past Surgical History:  Procedure Laterality Date   DILATION AND EVACUATION N/A 10/01/2020   Procedure: DILATATION AND EVACUATION;  Surgeon: Conan Bowens, MD;  Location: [facility]  [place];  Service: Gynecology;  Laterality: N/A;   NO PAST SURGERIES       OB History     Gravida  2   Para  0   Term  0   Preterm  0   AB  2   Living  0      SAB  2   IAB  0   Ectopic  0   Multiple  0   Live Births  0           Family History  Problem Relation Age of Onset   Cancer Paternal Grandmother        breast   Heart disease Paternal Grandmother    Diabetes Mother    Stroke Mother    Hypertension Mother    Heart disease Paternal Aunt     Social History   Tobacco Use   Smoking status: Never   Smokeless tobacco: Never  Vaping Use   Vaping Use: Never used  Substance Use Topics   Alcohol use: No   Drug use: No    Home Medications Prior to Admission medications   Medication Sig Start Date End Date Taking? Authorizing Provider  docusate sodium (COLACE) 100 MG capsule Take 1 capsule (100 mg total) by mouth 2 (two) times daily as needed. 10/01/20   Conan Bowens, MD  doxycycline (VIBRAMYCIN) 100 MG capsule Take 1 capsule (100 mg total) by  mouth 2 (two) times daily. 10/02/20   Conan Bowens, MD  Doxylamine-Pyridoxine (DICLEGIS) 10-10 MG TBEC 2 tabs q hs, if sx persist add 1 tab q am on day 3, if sx persist add 1 tab q afternoon on day 4 08/28/20   Cheral Marker, CNM  ibuprofen (ADVIL) 600 MG tablet Take 1 tablet (600 mg total) by mouth every 6 (six) hours as needed for headache, mild pain, moderate pain or cramping. 10/01/20   Conan Bowens, MD  norethindrone (MICRONOR) 0.35 MG tablet Take 1 tablet (0.35 mg total) by mouth daily. 10/16/20   Conan Bowens, MD  ondansetron (ZOFRAN) 4 MG tablet Take 1 tablet (4 mg total) by mouth every 6 (six) hours as needed for nausea or vomiting. 12/19/20   Mickie Bail, NP  oxyCODONE (ROXICODONE) 5 MG immediate release tablet Take 1 tablet (5 mg total) by mouth every 4 (four) hours as needed for severe pain. 10/01/20   Conan Bowens, MD  oxyCODONE-acetaminophen (PERCOCET/ROXICET) 5-325 MG tablet  Take 2 tablets by mouth every 4 (four) hours as needed for severe pain. 09/30/20   Raelyn Mora, CNM  Prenat w/o A-FeCbGl-DSS-FA-DHA [facility] ASSURE) 35-1 & 300 MG tablet One tablet and one capsule daily 08/28/20   Cheral Marker, CNM    Allergies    Patient has no known allergies.  Review of Systems   Review of Systems  Constitutional:  Positive for activity change, appetite change, chills, fatigue and fever.  HENT:  Positive for congestion and rhinorrhea. Negative for sore throat.   Eyes:  Positive for photophobia.  Respiratory:  Negative for cough and shortness of breath.   Gastrointestinal:  Positive for abdominal pain, nausea and vomiting.  Genitourinary:  Negative for dysuria and hematuria.  Musculoskeletal:  Positive for arthralgias and myalgias.  Skin:  Negative for rash.  Neurological:  Positive for weakness and headaches.   all other systems are negative except as noted in the HPI and PMH.   Physical Exam Updated Vital Signs BP 102/66   Pulse 73   Temp 98.3 F (36.8 C)   Resp 20   Ht 5\' 4"  (1.626 m)   Wt 91.4 kg   SpO2 100%   BMI 34.59 kg/m   Physical Exam Vitals and nursing note reviewed.  Constitutional:      General: She is not in acute distress.    Appearance: She is well-developed.  HENT:     Head: Normocephalic and atraumatic.     Ears:     Comments: Cerumen impaction bilaterally, oropharynx is clear.    Mouth/Throat:     Pharynx: No oropharyngeal exudate.  Eyes:     Conjunctiva/sclera: Conjunctivae normal.     Pupils: Pupils are equal, round, and reactive to light.  Neck:     Comments: No meningismus. FROM neck, able to touch chin to chest. Cardiovascular:     Rate and Rhythm: Normal rate and regular rhythm.     Heart sounds: Normal heart sounds. No murmur heard. Pulmonary:     Effort: Pulmonary effort is normal. No respiratory distress.     Breath sounds: Normal breath sounds.  Abdominal:     Palpations: Abdomen is soft.      Tenderness: There is abdominal tenderness. There is no guarding or rebound.     Comments: Periumbilical right lower quadrant tenderness with voluntary guarding  Musculoskeletal:        General: No tenderness. Normal range of motion.     Cervical  back: Normal range of motion and neck supple.  Skin:    General: Skin is warm.  Neurological:     Mental Status: She is alert and oriented to person, place, and time.     Cranial Nerves: No cranial nerve deficit.     Motor: No abnormal muscle tone.     Coordination: Coordination normal.     Comments:  5/5 strength throughout. CN 2-12 intact.Equal grip strength.   Psychiatric:        Behavior: Behavior normal.    ED Results / Procedures / Treatments   Labs (all labs ordered are listed, but only abnormal results are displayed) Labs Reviewed  RESP PANEL BY RT-PCR (FLU A&B, COVID) ARPGX2 - Abnormal; Notable for the following components:      Result Value   SARS Coronavirus 2 by RT PCR POSITIVE (*)    All other components within normal limits  CBC WITH DIFFERENTIAL/PLATELET - Abnormal; Notable for the following components:   Hemoglobin 11.6 (*)    HCT 35.2 (*)    Lymphs Abs 0.6 (*)    All other components within normal limits  COMPREHENSIVE METABOLIC PANEL - Abnormal; Notable for the following components:   Sodium 131 (*)    Potassium 3.3 (*)    CO2 20 (*)    Glucose, Bld 118 (*)    Creatinine, Ser 1.04 (*)    All other components within normal limits  URINALYSIS, ROUTINE W REFLEX MICROSCOPIC - Abnormal; Notable for the following components:   APPearance HAZY (*)    pH 9.0 (*)    Hgb urine dipstick SMALL (*)    Bacteria, UA RARE (*)    All other components within normal limits  CULTURE, BLOOD (ROUTINE X 2)  CULTURE, BLOOD (ROUTINE X 2)  CK  PREGNANCY, URINE  HCG, SERUM, QUALITATIVE  LACTIC ACID, PLASMA  LACTIC ACID, PLASMA    EKG None  Radiology CT Head Wo Contrast  Result Date: 06/03/2021 CLINICAL DATA:  Headache EXAM: CT  HEAD WITHOUT CONTRAST TECHNIQUE: Contiguous axial images were obtained from the base of the skull through the vertex without intravenous contrast. COMPARISON:  None. FINDINGS: Brain: There is no mass, hemorrhage or extra-axial collection. The size and configuration of the ventricles and extra-axial CSF spaces are normal. The brain parenchyma is normal, without acute or chronic infarction. Vascular: No abnormal hyperdensity of the major intracranial arteries or dural venous sinuses. No intracranial atherosclerosis. Skull: The visualized skull base, calvarium and extracranial soft tissues are normal. Sinuses/Orbits: No fluid levels or advanced mucosal thickening of the visualized paranasal sinuses. No mastoid or middle ear effusion. The orbits are normal. IMPRESSION: Normal head CT. Electronically Signed   By: Deatra Robinson M.D.   On: 06/03/2021 02:14   CT ABDOMEN PELVIS W CONTRAST  Result Date: 06/03/2021 CLINICAL DATA:  Right lower quadrant abdominal pain and fever. COVID positive. Nausea and vomiting. EXAM: CT ABDOMEN AND PELVIS WITH CONTRAST TECHNIQUE: Multidetector CT imaging of the abdomen and pelvis was performed using the standard protocol following bolus administration of intravenous contrast. CONTRAST:  OMNIPAQUE IOHEXOL 300 MG/ML  SOLN COMPARISON:  None. FINDINGS: Lower chest: Lung bases are clear. Hepatobiliary: Circumscribed low-attenuation lesion in the right lobe of the liver likely representing a cyst. No other focal lesions identified. Gallbladder and bile ducts are unremarkable. Pancreas: Unremarkable. No pancreatic ductal dilatation or surrounding inflammatory changes. Spleen: Normal in size without focal abnormality. Adrenals/Urinary Tract: Adrenal glands are unremarkable. Kidneys are normal, without renal calculi, focal lesion, or  hydronephrosis. Bladder is unremarkable. Stomach/Bowel: Stomach, small bowel, and colon are not abnormally distended. No wall thickening or inflammatory changes  are appreciated. Appendix is not identified. Vascular/Lymphatic: No significant vascular findings are present. No enlarged abdominal or pelvic lymph nodes. Reproductive: Uterus and bilateral adnexa are unremarkable. Other: No abdominal wall hernia or abnormality. No abdominopelvic ascites. Musculoskeletal: No acute or significant osseous findings. IMPRESSION: No acute process demonstrated in the abdomen or pelvis. No evidence of bowel obstruction or inflammation. Electronically Signed   By: Burman Nieves M.D.   On: 06/03/2021 02:17   DG Chest Port 1 View  Result Date: 06/03/2021 CLINICAL DATA:  Fever today. COVID positive. Abdominal pain, nausea, and vomiting. EXAM: PORTABLE CHEST 1 VIEW COMPARISON:  12/22/2013 FINDINGS: The heart size and mediastinal contours are within normal limits. Both lungs are clear. The visualized skeletal structures are unremarkable. IMPRESSION: No active disease. Electronically Signed   By: Burman Nieves M.D.   On: 06/03/2021 01:59    Procedures Procedures   Medications Ordered in ED Medications  iohexol (OMNIPAQUE) 300 MG/ML solution 100 mL (has no administration in time range)  acetaminophen (TYLENOL) tablet 650 mg (650 mg Oral Given 06/02/21 2322)  sodium chloride 0.9 % bolus 1,000 mL (0 mLs Intravenous Stopped 06/03/21 0112)  sodium chloride 0.9 % bolus 1,000 mL (1,000 mLs Intravenous New Bag/Given 06/03/21 0103)    ED Course  I have reviewed the triage vital signs and the nursing notes.  Pertinent labs & imaging results that were available during my care of the patient were reviewed by me and considered in my medical decision making (see chart for details).    MDM Rules/Calculators/A&P                          2 days of headache, fever, abdominal pain, nausea and vomiting with congestion.  Febrile on arrival.  Neurologically intact. COVID is positive  Patient given IV fluids as well as antipyretics.  Lactate is normal. no leukocytosis.  Chest x-ray is  negative.  CT scan obtained given her right lower quadrant pain with nausea and vomiting.  Appendix is not identified but there is no inflammation in this region. No vomiting throughout ED course.  Headache is improving.  Low suspicion for subarachnoid hemorrhage, meningitis, temporal arteritis.  No meningismus and nonfocal neurological exam.  Though she is febrile with a headache she has other symptoms including abdominal pain, vomiting, rhinorrhea and sinus congestion. D/w patient that with her constellation of symptoms, bacterial meningitis seems unlikely.  She agrees to defer lumbar puncture at this time. She appears to have capacity to make this decision.  Suspect her symptoms are likely due to COVID.  She appears well nontoxic.  Fever has resolved.  She is tolerating p.o.  Abdomen is soft though she has some mild right-sided tenderness still.  Agreeable to start antivirals.  Discussed quarantine, oral hydration, antipyretics. Patient tolerating p.o. and ambulatory.  No hypoxia or increased work of breathing.  Quarantine precautions as above.  Return to the ED with worsening symptoms including right-sided lower abdominal pain, chest pain, difficulty breathing, persistent fever or vomiting or headache or any other concerns.  DAWT REEB was evaluated in Emergency Department on 06/03/2021 for the symptoms described in the history of present illness. She was evaluated in the context of the global COVID-19 pandemic, which necessitated consideration that the patient might be at risk for infection with the SARS-CoV-2 virus that causes COVID-19. Institutional protocols  and algorithms that pertain to the evaluation of patients at risk for COVID-19 are in a state of rapid change based on information released by regulatory bodies including the CDC and federal and state organizations. These policies and algorithms were followed during the patient's care in the ED.  Final Clinical Impression(s) / ED  Diagnoses Final diagnoses:  COVID-19  RLQ abdominal pain  Fever, unspecified fever cause    Rx / DC Orders ED Discharge Orders     None        Avanell Banwart, Jeannett SeniorStephen, MD 06/03/21 [phone]

## 2021-06-04 ENCOUNTER — Telehealth: Payer: Self-pay | Admitting: *Deleted

## 2021-06-04 NOTE — Telephone Encounter (Signed)
Transition Care Management Unsuccessful Follow-up Telephone Call  Date of discharge and from where:  06/03/2021 - [facility]  Attempts:  1st Attempt  Reason for unsuccessful TCM follow-up call:  Left voice message

## 2021-06-05 NOTE — Telephone Encounter (Signed)
Transition Care Management Follow-up Telephone Call Date of discharge and from where: 06/03/2021-[facility] How have you been since you were released from the hospital? Doing fine. Any questions or concerns? No  Items Reviewed: Did the pt receive and understand the discharge instructions provided? Yes  Medications obtained and verified? Yes  Other? No  Any new allergies since your discharge? No  Dietary orders reviewed? No Do you have support at home? Yes   Home Care and Equipment/Supplies: Were home health services ordered? not applicable If so, what is the name of the agency? N/A Has the agency set up a time to come to the patient's home? not applicable Were any new equipment or medical supplies ordered?  No What is the name of the medical supply agency? N/A Were you able to get the supplies/equipment? not applicable Do you have any questions related to the use of the equipment or supplies? No  Functional Questionnaire: (I = Independent and D = Dependent) ADLs: I  Bathing/Dressing- I  Meal Prep- I  Eating- I  Maintaining continence- I  Transferring/Ambulation- I  Managing Meds- I  Follow up appointments reviewed:  PCP Hospital f/u appt confirmed? No  Sending referral to MM team to set patient up with new PCP. Specialist Hospital f/u appt confirmed? No  N/A Are transportation arrangements needed? No  If their condition worsens, is the pt aware to call PCP or go to the Emergency Dept.? Yes Was the patient provided with contact information for the PCP's office or ED? Yes, provided information for ED Was to pt encouraged to call back with questions or concerns? Yes

## 2021-06-08 LAB — CULTURE, BLOOD (ROUTINE X 2)
Culture: NO GROWTH
Culture: NO GROWTH
Special Requests: ADEQUATE
Special Requests: ADEQUATE

## 2021-07-22 ENCOUNTER — Other Ambulatory Visit (HOSPITAL_COMMUNITY)
Admission: RE | Admit: 2021-07-22 | Discharge: 2021-07-22 | Disposition: A | Discharge status: Home / Self Care | Payer: Medicaid Other | Source: Ambulatory Visit | Attending: Obstetrics & Gynecology | Admitting: Obstetrics & Gynecology

## 2021-07-22 ENCOUNTER — Ambulatory Visit: Payer: Medicaid Other | Admitting: Obstetrics & Gynecology

## 2021-07-22 ENCOUNTER — Other Ambulatory Visit: Payer: Self-pay

## 2021-07-22 ENCOUNTER — Encounter: Payer: Self-pay | Admitting: Obstetrics & Gynecology

## 2021-07-22 VITALS — BP 117/75 | HR 70 | Ht 63.0 in | Wt 196.0 lb

## 2021-07-22 DIAGNOSIS — N730 Acute parametritis and pelvic cellulitis: Secondary | ICD-10-CM | POA: Insufficient documentation

## 2021-07-22 MED ORDER — CEFTRIAXONE SODIUM 250 MG IJ SOLR
250.0000 mg | Freq: Once | INTRAMUSCULAR | Status: AC
  Administered 2021-07-22: 250 mg via INTRAMUSCULAR

## 2021-07-22 MED ORDER — DOXYCYCLINE HYCLATE 100 MG PO TABS: 100.0000 mg | ORAL_TABLET | Freq: Two times a day (BID) | ORAL | 0 refills | Status: DC

## 2021-07-22 NOTE — Progress Notes (Unsigned)
Chief Complaint  Patient presents with   pain in uterus      25 y.o. G2P0020 Patient's last menstrual period was 06/30/2021 (exact date). The current method of family planning is {contraception:315051}.  Outpatient Encounter Medications as of 07/22/2021  Medication Sig   doxycycline (VIBRA-TABS) 100 MG tablet Take 1 tablet (100 mg total) by mouth 2 (two) times daily.   Doxylamine-Pyridoxine (DICLEGIS) 10-10 MG TBEC 2 tabs q hs, if sx persist add 1 tab q am on day 3, if sx persist add 1 tab q afternoon on day 4   ibuprofen (ADVIL) 600 MG tablet Take 1 tablet (600 mg total) by mouth every 6 (six) hours as needed for headache, mild pain, moderate pain or cramping.   ondansetron (ZOFRAN) 4 MG tablet Take 1 tablet (4 mg total) by mouth every 6 (six) hours as needed for nausea or vomiting.   [DISCONTINUED] docusate sodium (COLACE) 100 MG capsule Take 1 capsule (100 mg total) by mouth 2 (two) times daily as needed.   [DISCONTINUED] doxycycline (VIBRAMYCIN) 100 MG capsule Take 1 capsule (100 mg total) by mouth 2 (two) times daily.   [DISCONTINUED] norethindrone (MICRONOR) 0.35 MG tablet Take 1 tablet (0.35 mg total) by mouth daily.   [DISCONTINUED] ondansetron (ZOFRAN ODT) 4 MG disintegrating tablet Take 1 tablet (4 mg total) by mouth every 8 (eight) hours as needed for nausea or vomiting.   [DISCONTINUED] oxyCODONE (ROXICODONE) 5 MG immediate release tablet Take 1 tablet (5 mg total) by mouth every 4 (four) hours as needed for severe pain.   [DISCONTINUED] oxyCODONE-acetaminophen (PERCOCET/ROXICET) 5-325 MG tablet Take 2 tablets by mouth every 4 (four) hours as needed for severe pain.   [DISCONTINUED] Prenat w/o A-FeCbGl-DSS-FA-DHA (CITRANATAL ASSURE) 35-1 & 300 MG tablet One tablet and one capsule daily   No facility-administered encounter medications on file as of 07/22/2021.    Subjective *** Past Medical History:  Diagnosis Date   Migraine with aura 2014   NO ESTROGEN   Seizures  (HCC)    last seizure 2017    Past Surgical History:  Procedure Laterality Date   DILATION AND EVACUATION N/A 10/01/2020   Procedure: DILATATION AND EVACUATION;  Surgeon: Conan Bowens, MD;  Location: [facility];  Service: Gynecology;  Laterality: N/A;   NO PAST SURGERIES      OB History     Gravida  2   Para  0   Term  0   Preterm  0   AB  2   Living  0      SAB  2   IAB  0   Ectopic  0   Multiple  0   Live Births  0           No Known Allergies  Social History   Socioeconomic History   Marital status: Single    Spouse name: Not on file   Number of children: Not on file   Years of education: Not on file   Highest education level: Not on file  Occupational History   Not on file  Tobacco Use   Smoking status: Never   Smokeless tobacco: Never  Vaping Use   Vaping Use: Never used  Substance and Sexual Activity   Alcohol use: Yes    Comment: occ   Drug use: No   Sexual activity: Yes    Birth control/protection: None  Other Topics Concern   Not on file  Social History Narrative  Not on file   Social Determinants of Health   Financial Resource Strain: Not on file  Food Insecurity: Not on file  Transportation Needs: Not on file  Physical Activity: Not on file  Stress: Not on file  Social Connections: Not on file    Family History  Problem Relation Age of Onset   Cancer Paternal Grandmother        breast   Heart disease Paternal Grandmother    Diabetes Mother    Stroke Mother    Hypertension Mother    Heart disease Paternal Aunt     Medications:       Current Outpatient Medications:    doxycycline (VIBRA-TABS) 100 MG tablet, Take 1 tablet (100 mg total) by mouth 2 (two) times daily., Disp: 20 tablet, Rfl: 0   Doxylamine-Pyridoxine (DICLEGIS) 10-10 MG TBEC, 2 tabs q hs, if sx persist add 1 tab q am on day 3, if sx persist add 1 tab q afternoon on day 4, Disp: 100 tablet, Rfl: 6   ibuprofen (ADVIL) 600 MG tablet,  Take 1 tablet (600 mg total) by mouth every 6 (six) hours as needed for headache, mild pain, moderate pain or cramping., Disp: 30 tablet, Rfl: 1   ondansetron (ZOFRAN) 4 MG tablet, Take 1 tablet (4 mg total) by mouth every 6 (six) hours as needed for nausea or vomiting., Disp: 12 tablet, Rfl: 0  Objective Blood pressure 117/75, pulse 70, height 5\' 3"  (1.6 m), weight 196 lb (88.9 kg), last menstrual period 06/30/2021.  ***  Pertinent ROS ***  Labs or studies ***    Impression Diagnoses this Encounter::   ICD-10-CM   1. PID (acute pelvic inflammatory disease)  N73.0       Established relevant diagnosis(es): ***  Plan/Recommendations: Meds ordered this encounter  Medications   doxycycline (VIBRA-TABS) 100 MG tablet    Sig: Take 1 tablet (100 mg total) by mouth 2 (two) times daily.    Dispense:  20 tablet    Refill:  0    Labs or Scans Ordered: No orders of the defined types were placed in this encounter.   Management:: ***  Follow up No follow-ups on file.     [phone]: 10 min     99213: 15 min      99214:25 min}   Face to face time:  *** minutes  Greater than 50% of the visit time was spent in counseling and coordination of care with the patient.  The summary and outline of the counseling and care coordination is summarized in the note above.   All questions were answered.

## 2021-07-24 LAB — CERVICOVAGINAL ANCILLARY ONLY
Bacterial Vaginitis (gardnerella): POSITIVE — AB
Candida Glabrata: NEGATIVE
Candida Vaginitis: POSITIVE — AB
Chlamydia: NEGATIVE
Comment: NEGATIVE
Comment: NEGATIVE
Comment: NEGATIVE
Comment: NEGATIVE
Comment: NEGATIVE
Comment: NORMAL
Neisseria Gonorrhea: NEGATIVE
Trichomonas: NEGATIVE

## 2021-09-16 ENCOUNTER — Encounter: Payer: Self-pay | Admitting: Adult Health

## 2021-09-16 ENCOUNTER — Other Ambulatory Visit: Payer: Self-pay

## 2021-09-16 ENCOUNTER — Other Ambulatory Visit (HOSPITAL_COMMUNITY)
Admission: RE | Admit: 2021-09-16 | Discharge: 2021-09-16 | Disposition: A | Discharge status: Home / Self Care | Payer: Medicaid Other | Source: Ambulatory Visit | Attending: Adult Health | Admitting: Adult Health

## 2021-09-16 ENCOUNTER — Ambulatory Visit (INDEPENDENT_AMBULATORY_CARE_PROVIDER_SITE_OTHER): Payer: Medicaid Other | Admitting: Adult Health

## 2021-09-16 VITALS — BP 112/69 | HR 68 | Ht 64.0 in | Wt 191.0 lb

## 2021-09-16 DIAGNOSIS — R1084 Generalized abdominal pain: Secondary | ICD-10-CM | POA: Diagnosis not present

## 2021-09-16 DIAGNOSIS — R110 Nausea: Secondary | ICD-10-CM | POA: Diagnosis not present

## 2021-09-16 DIAGNOSIS — Z124 Encounter for screening for malignant neoplasm of cervix: Secondary | ICD-10-CM | POA: Insufficient documentation

## 2021-09-16 DIAGNOSIS — N926 Irregular menstruation, unspecified: Secondary | ICD-10-CM | POA: Diagnosis not present

## 2021-09-16 DIAGNOSIS — Z3202 Encounter for pregnancy test, result negative: Secondary | ICD-10-CM | POA: Diagnosis not present

## 2021-09-16 LAB — POCT URINE PREGNANCY: Preg Test, Ur: NEGATIVE

## 2021-09-16 MED ORDER — OMEPRAZOLE 10 MG PO CPDR: DELAYED_RELEASE_CAPSULE | ORAL | 3 refills | Status: DC

## 2021-09-16 NOTE — Progress Notes (Signed)
  Subjective:     Patient ID: Patricia Horton, female   DOB: 1996-01-08, 25 y.o.   MRN: [id number]  HPI Patricia Horton is a 25 year old black female,single, G2P0020, in complaining of abdominal pain, nausea and missed September period. She was treated for PID in August. She had normal CT in July.  She needs a pap. PCP is Dr Loleta Chance.  Review of Systems Abdominal pain +nausea +missed period Denies any pain with sex, or problems with urination or bowel movements,or undue stress Reviewed past medical,surgical, social and family history. Reviewed medications and allergies.    Objective:   Physical Exam BP 112/69 (BP Location: Left Arm, Patient Position: Sitting, Cuff Size: Normal)   Pulse 68   Ht 5\' 4"  (1.626 m)   Wt 191 lb (86.6 kg)   LMP 07/30/2021   BMI 32.79 kg/m  UPT is negative  Skin warm and dry.Abdomen is soft and non tender, no HSM. Pelvic: external genitalia is normal in appearance no lesions, vagina: white discharge with slight odor,urethra has no lesions or masses noted, cervix:smooth,no CMT,Pap with GC/CHL and HR HPV genotyping performed, uterus: normal size, shape and contour, non tender, no masses felt, adnexa: no masses or tenderness noted. Bladder is non tender and no masses felt.    Fall risk is low  Upstream - 09/16/21 0910       Pregnancy Intention Screening   Does the patient want to become pregnant in the next year? Ok Either Way    Does the patient's partner want to become pregnant in the next year? Ok Either Way    Would the patient like to discuss contraceptive options today? No      Contraception Wrap Up   Current Method No Method - Other Reason    End Method No Method - Other Reason            Examination chaperoned by 09/18/21 LPN  Assessment:     1. Pregnancy examination or test, negative result   2. Routine cervical smear Pap sent with GC/CHL and HR HPV genotyping   3. Generalized abdominal pain  4. Nausea Will try Prilosec  Meds ordered this  encounter  Medications   omeprazole (PRILOSEC) 10 MG capsule    Sig: Take 1 bid    Dispense:  60 capsule    Refill:  3    Order Specific Question:   Supervising Provider    Answer:   EURE, LUTHER H [[id number]]     5. Missed period Will watch for now     Plan:     Follow up with me in 6 weeks to see if has had period or not and if feels better, if not will refer to GI

## 2021-09-19 LAB — CYTOLOGY - PAP
Adequacy: ABSENT
Chlamydia: NEGATIVE
Comment: NEGATIVE
Comment: NEGATIVE
Comment: NORMAL
Diagnosis: NEGATIVE
High risk HPV: NEGATIVE
Neisseria Gonorrhea: NEGATIVE

## 2021-09-29 ENCOUNTER — Ambulatory Visit (INDEPENDENT_AMBULATORY_CARE_PROVIDER_SITE_OTHER): Payer: Medicaid Other

## 2021-09-29 ENCOUNTER — Other Ambulatory Visit: Payer: Self-pay

## 2021-09-29 ENCOUNTER — Other Ambulatory Visit: Payer: Self-pay | Admitting: Adult Health

## 2021-09-29 VITALS — BP 116/68 | HR 75 | Ht 64.0 in | Wt 187.2 lb

## 2021-09-29 DIAGNOSIS — Z3682 Encounter for antenatal screening for nuchal translucency: Secondary | ICD-10-CM

## 2021-09-29 DIAGNOSIS — Z3201 Encounter for pregnancy test, result positive: Secondary | ICD-10-CM

## 2021-09-29 DIAGNOSIS — Z3200 Encounter for pregnancy test, result unknown: Secondary | ICD-10-CM | POA: Diagnosis not present

## 2021-09-29 DIAGNOSIS — O3680X0 Pregnancy with inconclusive fetal viability, not applicable or unspecified: Secondary | ICD-10-CM

## 2021-09-29 DIAGNOSIS — Z8759 Personal history of other complications of pregnancy, childbirth and the puerperium: Secondary | ICD-10-CM | POA: Diagnosis not present

## 2021-09-29 LAB — POCT URINE PREGNANCY: Preg Test, Ur: POSITIVE — AB

## 2021-09-29 MED ORDER — DOXYLAMINE-PYRIDOXINE 10-10 MG PO TBEC: DELAYED_RELEASE_TABLET | ORAL | 6 refills | Status: DC

## 2021-09-29 NOTE — Progress Notes (Addendum)
   NURSE VISIT- PREGNANCY CONFIRMATION   SUBJECTIVE:  Patricia Horton is a 25 y.o. G3P0020 female at [redacted]w[redacted]d by certain LMP of Patient's last menstrual period was 06/30/2021 (exact date). Here for pregnancy confirmation.  Home pregnancy test: positive x 6   She reports nausea.  She is not taking prenatal vitamins.    OBJECTIVE:  BP 116/68 (BP Location: Right Arm, Patient Position: Sitting, Cuff Size: Normal)   Pulse 75   Ht 5\' 4"  (1.626 m)   Wt 187 lb 3.2 oz (84.9 kg)   LMP 06/30/2021 (Exact Date)   BMI 32.13 kg/m   Appears well, in no apparent distress  Results for orders placed or performed in visit on 09/29/21 (from the past 24 hour(s))  POCT urine pregnancy   Collection Time: 09/29/21  9:56 AM  Result Value Ref Range   Preg Test, Ur Positive (A) Negative    ASSESSMENT: Positive pregnancy test, [redacted]w[redacted]d by LMP    PLAN: Schedule for dating ultrasound in first available Prenatal vitamins: plans to begin OTC ASAP   Nausea medicines: requested-note routed to [redacted]w[redacted]d to send prescription   OB packet given: Yes  Patricia Horton A Tesla Bochicchio  09/29/2021 9:58 AM   Chart reviewed for nurse visit. Agree with plan of care. Rx diclegis. H/O 2 SABs, will check progesterone.  10/01/2021, Cheral Marker 09/29/2021 12:51 PM

## 2021-09-29 NOTE — Addendum Note (Signed)
Addended by: Leilani Able, Euel Castile A on: 09/29/2021 10:07 AM   Modules accepted: Orders

## 2021-09-29 NOTE — Addendum Note (Signed)
Addended by: Shawna Clamp R on: 09/29/2021 12:52 PM   Modules accepted: Orders

## 2021-09-30 ENCOUNTER — Other Ambulatory Visit: Payer: Self-pay | Admitting: Adult Health

## 2021-09-30 ENCOUNTER — Other Ambulatory Visit: Payer: Self-pay | Admitting: Women's Health

## 2021-09-30 ENCOUNTER — Ambulatory Visit (INDEPENDENT_AMBULATORY_CARE_PROVIDER_SITE_OTHER): Payer: Medicaid Other

## 2021-09-30 DIAGNOSIS — Z3A13 13 weeks gestation of pregnancy: Secondary | ICD-10-CM

## 2021-09-30 DIAGNOSIS — Z3682 Encounter for antenatal screening for nuchal translucency: Secondary | ICD-10-CM

## 2021-09-30 DIAGNOSIS — O3680X0 Pregnancy with inconclusive fetal viability, not applicable or unspecified: Secondary | ICD-10-CM

## 2021-09-30 LAB — PROGESTERONE: Progesterone: 6.8 ng/mL

## 2021-09-30 MED ORDER — PROGESTERONE 200 MG PO CAPS: 200.0000 mg | ORAL_CAPSULE | Freq: Every day | ORAL | 3 refills | Status: DC

## 2021-09-30 NOTE — Progress Notes (Signed)
[place] 5+[redacted] wk gestational sac with small YS,hemorrhage corpus luteal cyst 2.1 x 1.9 x 1.6 cm,normal left ovary

## 2021-10-02 ENCOUNTER — Other Ambulatory Visit: Payer: Medicaid Other

## 2021-10-02 ENCOUNTER — Ambulatory Visit: Payer: Medicaid Other | Admitting: Adult Health

## 2021-10-04 ENCOUNTER — Emergency Department (HOSPITAL_COMMUNITY)
Admission: EM | Admit: 2021-10-04 | Discharge: 2021-10-04 | Disposition: A | Discharge status: Home / Self Care | Payer: Medicaid Other | Attending: Emergency Medicine | Admitting: Emergency Medicine

## 2021-10-04 ENCOUNTER — Emergency Department (HOSPITAL_COMMUNITY): Payer: Medicaid Other

## 2021-10-04 ENCOUNTER — Encounter (HOSPITAL_COMMUNITY): Payer: Self-pay | Admitting: Emergency Medicine

## 2021-10-04 ENCOUNTER — Other Ambulatory Visit: Payer: Self-pay

## 2021-10-04 DIAGNOSIS — R1110 Vomiting, unspecified: Secondary | ICD-10-CM | POA: Diagnosis not present

## 2021-10-04 DIAGNOSIS — O26891 Other specified pregnancy related conditions, first trimester: Secondary | ICD-10-CM | POA: Insufficient documentation

## 2021-10-04 DIAGNOSIS — R1032 Left lower quadrant pain: Secondary | ICD-10-CM | POA: Insufficient documentation

## 2021-10-04 DIAGNOSIS — Z3A01 Less than 8 weeks gestation of pregnancy: Secondary | ICD-10-CM | POA: Diagnosis not present

## 2021-10-04 DIAGNOSIS — Z3A13 13 weeks gestation of pregnancy: Secondary | ICD-10-CM | POA: Diagnosis not present

## 2021-10-04 DIAGNOSIS — O26899 Other specified pregnancy related conditions, unspecified trimester: Secondary | ICD-10-CM

## 2021-10-04 DIAGNOSIS — O200 Threatened abortion: Secondary | ICD-10-CM

## 2021-10-04 DIAGNOSIS — R1031 Right lower quadrant pain: Secondary | ICD-10-CM | POA: Diagnosis not present

## 2021-10-04 DIAGNOSIS — K529 Noninfective gastroenteritis and colitis, unspecified: Secondary | ICD-10-CM

## 2021-10-04 LAB — CBC WITH DIFFERENTIAL/PLATELET
Abs Immature Granulocytes: 0.02 10*3/uL (ref 0.00–0.07)
Basophils Absolute: 0 10*3/uL (ref 0.0–0.1)
Basophils Relative: 1 %
Eosinophils Absolute: 0 10*3/uL (ref 0.0–0.5)
Eosinophils Relative: 1 %
HCT: 35.8 % — ABNORMAL LOW (ref 36.0–46.0)
Hemoglobin: 11.7 g/dL — ABNORMAL LOW (ref 12.0–15.0)
Immature Granulocytes: 1 %
Lymphocytes Relative: 16 %
Lymphs Abs: 0.7 10*3/uL (ref 0.7–4.0)
MCH: 27.5 pg (ref 26.0–34.0)
MCHC: 32.7 g/dL (ref 30.0–36.0)
MCV: 84 fL (ref 80.0–100.0)
Monocytes Absolute: 0.4 10*3/uL (ref 0.1–1.0)
Monocytes Relative: 8 %
Neutro Abs: 3.3 10*3/uL (ref 1.7–7.7)
Neutrophils Relative %: 73 %
Platelets: 289 10*3/uL (ref 150–400)
RBC: 4.26 MIL/uL (ref 3.87–5.11)
RDW: 13.9 % (ref 11.5–15.5)
WBC: 4.4 10*3/uL (ref 4.0–10.5)
nRBC: 0 % (ref 0.0–0.2)

## 2021-10-04 LAB — COMPREHENSIVE METABOLIC PANEL
ALT: 19 U/L (ref 0–44)
AST: 19 U/L (ref 15–41)
Albumin: 3.9 g/dL (ref 3.5–5.0)
Alkaline Phosphatase: 39 U/L (ref 38–126)
Anion gap: 7 (ref 5–15)
BUN: 10 mg/dL (ref 6–20)
CO2: 22 mmol/L (ref 22–32)
Calcium: 9.3 mg/dL (ref 8.9–10.3)
Chloride: 105 mmol/L (ref 98–111)
Creatinine, Ser: 0.74 mg/dL (ref 0.44–1.00)
GFR, Estimated: >60 mL/min (ref >60)
Glucose, Bld: 97 mg/dL (ref 70–99)
Potassium: 3.7 mmol/L (ref 3.5–5.1)
Sodium: 134 mmol/L — ABNORMAL LOW (ref 135–145)
Total Bilirubin: 0.9 mg/dL (ref 0.3–1.2)
Total Protein: 7.2 g/dL (ref 6.5–8.1)

## 2021-10-04 LAB — URINALYSIS, ROUTINE W REFLEX MICROSCOPIC
Bilirubin Urine: NEGATIVE
Glucose, UA: NEGATIVE mg/dL
Hgb urine dipstick: NEGATIVE
Ketones, ur: 20 mg/dL — AB
Leukocytes,Ua: NEGATIVE
Nitrite: NEGATIVE
Protein, ur: NEGATIVE mg/dL
Specific Gravity, Urine: 1.021 (ref 1.005–1.030)
pH: 7 (ref 5.0–8.0)

## 2021-10-04 LAB — HCG, QUANTITATIVE, PREGNANCY: hCG, Beta Chain, Quant, S: 38311 m[IU]/mL — ABNORMAL HIGH (ref <5)

## 2021-10-04 MED ORDER — MORPHINE SULFATE (PF) 4 MG/ML IV SOLN
4.0000 mg | Freq: Once | INTRAVENOUS | Status: AC
  Administered 2021-10-04: 4 mg via INTRAVENOUS
  Filled 2021-10-04: qty 1

## 2021-10-04 MED ORDER — IOHEXOL 300 MG/ML  SOLN
100.0000 mL | Freq: Once | INTRAMUSCULAR | Status: AC | PRN
  Administered 2021-10-04: 100 mL via INTRAVENOUS

## 2021-10-04 MED ORDER — AMOXICILLIN-POT CLAVULANATE 875-125 MG PO TABS
1.0000 | ORAL_TABLET | Freq: Once | ORAL | Status: AC
  Administered 2021-10-04: 1 via ORAL
  Filled 2021-10-04: qty 1

## 2021-10-04 MED ORDER — SODIUM CHLORIDE 0.9 % IV BOLUS
1000.0000 mL | Freq: Once | INTRAVENOUS | Status: AC
  Administered 2021-10-04: 1000 mL via INTRAVENOUS

## 2021-10-04 MED ORDER — AMOXICILLIN-POT CLAVULANATE 875-125 MG PO TABS: 1.0000 | ORAL_TABLET | Freq: Two times a day (BID) | ORAL | 0 refills | Status: DC

## 2021-10-04 MED ORDER — ONDANSETRON HCL 4 MG/2ML IJ SOLN
4.0000 mg | Freq: Once | INTRAMUSCULAR | Status: AC
  Administered 2021-10-04: 4 mg via INTRAVENOUS
  Filled 2021-10-04: qty 2

## 2021-10-04 NOTE — ED Notes (Signed)
Pt transported to US

## 2021-10-04 NOTE — ED Triage Notes (Signed)
Pt to the ED with nausea and vomiting for the past few weeks.  Pt is pregnant and 8-13 weeks.

## 2021-10-04 NOTE — Discharge Instructions (Addendum)
You need a follow up ultrasound in 10 days.

## 2021-10-04 NOTE — ED Notes (Signed)
Pt just returned from [place]. Nad. States still having some cramping to lower abd. Denies vag bleeding. Nad. Resting well.

## 2021-10-04 NOTE — ED Provider Notes (Signed)
[facility] EMERGENCY DEPARTMENT Provider Note   CSN: [id number] Arrival date & time: 10/04/21  1002     History Chief Complaint  Patient presents with   Emesis    Patricia Horton is a 25 y.o. female.  Patient complains of lower abdominal pain and nausea.  Patient is pregnant.  The history is provided by the patient and medical records. No language interpreter was used.  Abdominal Pain Pain location:  LLQ and RLQ Pain quality: aching   Pain radiates to:  Does not radiate Pain severity:  Moderate Onset quality:  Sudden Timing:  Constant Progression:  Worsening Chronicity:  New Context: not alcohol use   Associated symptoms: no chest pain, no cough, no diarrhea, no fatigue and no hematuria       Past Medical History:  Diagnosis Date   Migraine with aura 2014   NO ESTROGEN   Seizures (HCC)    last seizure 2017    Patient Active Problem List   Diagnosis Date Noted   Nausea 09/16/2021   Generalized abdominal pain 09/16/2021   Routine cervical smear 09/16/2021   Pregnancy examination or test, negative result 09/16/2021   Missed period 09/16/2021   Nexplanon insertion 07/28/2016   Migraine with aura 08/14/2014   Nexplanon in place 03/01/2013   Seizures (HCC) 03/01/2013    Past Surgical History:  Procedure Laterality Date   DILATION AND EVACUATION N/A 10/01/2020   Procedure: DILATATION AND EVACUATION;  Surgeon: Conan Bowens, MD;  Location: [facility];  Service: Gynecology;  Laterality: N/A;   NO PAST SURGERIES       OB History     Gravida  3   Para  0   Term  0   Preterm  0   AB  2   Living  0      SAB  2   IAB  0   Ectopic  0   Multiple  0   Live Births  0           Family History  Problem Relation Age of Onset   Cancer Paternal Grandmother        breast   Heart disease Paternal Grandmother    Diabetes Mother    Stroke Mother    Hypertension Mother    Heart disease Paternal Aunt     Social History    Tobacco Use   Smoking status: Never   Smokeless tobacco: Never  Vaping Use   Vaping Use: Never used  Substance Use Topics   Alcohol use: Yes    Comment: occ   Drug use: No    Home Medications Prior to Admission medications   Medication Sig Start Date End Date Taking? Authorizing Provider  Doxylamine-Pyridoxine (DICLEGIS) 10-10 MG TBEC 2 tabs q hs, if sx persist add 1 tab q am on day 3, if sx persist add 1 tab q afternoon on day 4 09/29/21   Cheral Marker, CNM  progesterone (PROMETRIUM) 200 MG capsule Place 1 capsule (200 mg total) vaginally at bedtime. 09/30/21   Cheral Marker, CNM    Allergies    Patient has no known allergies.  Review of Systems   Review of Systems  Constitutional:  Negative for appetite change and fatigue.  HENT:  Negative for congestion, ear discharge and sinus pressure.   Eyes:  Negative for discharge.  Respiratory:  Negative for cough.   Cardiovascular:  Negative for chest pain.  Gastrointestinal:  Positive for abdominal pain. Negative for  diarrhea.  Genitourinary:  Negative for frequency and hematuria.  Musculoskeletal:  Negative for back pain.  Skin:  Negative for rash.  Neurological:  Negative for seizures and headaches.  Psychiatric/Behavioral:  Negative for hallucinations.    Physical Exam Updated Vital Signs BP 118/65 (BP Location: Left Arm)   Pulse 88   Temp 99.5 F (37.5 C) (Oral)   Resp 20   Ht 5\' 4"  (1.626 m)   Wt 88 kg   LMP 07/30/2021   SpO2 96%   BMI 33.30 kg/m   Physical Exam Vitals and nursing note reviewed.  Constitutional:      Appearance: She is well-developed.  HENT:     Head: Normocephalic.     Nose: Nose normal.  Eyes:     General: No scleral icterus.    Conjunctiva/sclera: Conjunctivae normal.  Neck:     Thyroid: No thyromegaly.  Cardiovascular:     Rate and Rhythm: Normal rate and regular rhythm.     Heart sounds: No murmur heard.   No friction rub. No gallop.  Pulmonary:     Breath sounds: No  stridor. No wheezing or rales.  Chest:     Chest wall: No tenderness.  Abdominal:     General: There is no distension.     Tenderness: There is abdominal tenderness. There is no rebound.     Comments: Patient tender right lower quadrant and suprapubic, left lower quadrant  Musculoskeletal:        General: Normal range of motion.     Cervical back: Neck supple.  Lymphadenopathy:     Cervical: No cervical adenopathy.  Skin:    Findings: No erythema or rash.  Neurological:     Mental Status: She is alert and oriented to person, place, and time.     Motor: No abnormal muscle tone.     Coordination: Coordination normal.  Psychiatric:        Behavior: Behavior normal.    ED Results / Procedures / Treatments   Labs (all labs ordered are listed, but only abnormal results are displayed) Labs Reviewed  CBC WITH DIFFERENTIAL/PLATELET - Abnormal; Notable for the following components:      Result Value   Hemoglobin 11.7 (*)    HCT 35.8 (*)    All other components within normal limits  COMPREHENSIVE METABOLIC PANEL - Abnormal; Notable for the following components:   Sodium 134 (*)    All other components within normal limits  URINALYSIS, ROUTINE W REFLEX MICROSCOPIC - Abnormal; Notable for the following components:   APPearance HAZY (*)    Ketones, ur 20 (*)    All other components within normal limits  HCG, QUANTITATIVE, PREGNANCY - Abnormal; Notable for the following components:   hCG, Beta Chain, Quant, S 38,311 (*)    All other components within normal limits    EKG None  Radiology US OB LESS THAN 14 WEEKS WITH OB TRANSVAGINAL  Result Date: 10/04/2021 CLINICAL DATA:  Pelvic pain. EXAM: OBSTETRIC <14 WK [place] AND TRANSVAGINAL OB US TECHNIQUE: Both transabdominal and transvaginal ultrasound examinations were performed for complete evaluation of the gestation as well as the maternal uterus, adnexal regions, and pelvic cul-de-sac. Transvaginal technique was performed to assess early  pregnancy. COMPARISON:  None. FINDINGS: Intrauterine gestational sac: Single; irregular sac shape noted Yolk sac:  Visualized. Embryo:  Not Visualized. MSD: 15 mm   6 w   2 Subchorionic hemorrhage:  None visualized. Maternal uterus/adnexae: Right ovarian corpus luteum noted. Normal appearance of left ovary.  No adnexal mass or abnormal free fluid identified. IMPRESSION: Findings are suspicious but not yet definitive for failed pregnancy. Recommend follow-up US in 10 days for definitive diagnosis. This recommendation follows SRU consensus guidelines: Diagnostic Criteria for Nonviable Pregnancy Early in the First Trimester. [employer] Med [id number]. Electronically Signed   By: Marlaine Hind M.D.   On: 10/04/2021 11:57    Procedures Procedures   Medications Ordered in ED Medications  sodium chloride 0.9 % bolus 1,000 mL (0 mLs Intravenous Stopped 10/04/21 1514)  ondansetron (ZOFRAN) injection 4 mg (4 mg Intravenous Given 10/04/21 1044)  morphine 4 MG/ML injection 4 mg (4 mg Intravenous Given 10/04/21 1511)  ondansetron (ZOFRAN) injection 4 mg (4 mg Intravenous Given 10/04/21 1511)   Patient with pregnancy but ultrasound does not show any fetus.  I spoke with OB/GYN and they believe she is got a blighted ovum.  But since patient is having so much pain she will get a CAT scan done which was approved by OB/GYN ED Course  I have reviewed the triage vital signs and the nursing notes.  Pertinent labs & imaging results that were available during my care of the patient were reviewed by me and considered in my medical decision making (see chart for details).    MDM Rules/Calculators/A&P                           Pregnancy with no fetus seen in uterus most likely blighted ovum Final Clinical Impression(s) / ED Diagnoses Final diagnoses:  Abdominal pain affecting pregnancy    Rx / DC Orders ED Discharge Orders     None        Milton Ferguson, MD 10/07/21 1735

## 2021-10-04 NOTE — ED Notes (Signed)
Pt sleeping. Woke to give sprite for oral trial. Nad. Ambulated to bathroom .

## 2021-10-04 NOTE — ED Provider Notes (Signed)
Pt signed out from Dr. Estell Harpin pending CT abd/pelvis.  CT:  IMPRESSION:  1. Mild colonic wall thickening extending from the ascending colon  through the splenic flexure, which may reflect underdistention  versus mild infectious/inflammatory colitis.  2. Normal appendix.      Due to possible colitis, pt put on Augmentin.  She is to get a repeat US in 10 days, but she is told that this pregnancy is likely failed.  She is to f/u with obgyn.  Return if worse.      Jacalyn Lefevre, MD 10/04/21 1724

## 2021-10-04 NOTE — ED Notes (Signed)
Edp updated pt. Nad. After edp palpated abd, pt stated it hurts and it made her nauseated. Emesis bag give.

## 2021-10-06 ENCOUNTER — Encounter (HOSPITAL_COMMUNITY): Payer: Self-pay | Admitting: *Deleted

## 2021-10-06 ENCOUNTER — Emergency Department (HOSPITAL_COMMUNITY)
Admission: EM | Admit: 2021-10-06 | Discharge: 2021-10-07 | Disposition: A | Discharge status: Home / Self Care | Payer: Medicaid Other | Attending: Emergency Medicine | Admitting: Emergency Medicine

## 2021-10-06 ENCOUNTER — Other Ambulatory Visit: Payer: Self-pay

## 2021-10-06 DIAGNOSIS — R1084 Generalized abdominal pain: Secondary | ICD-10-CM | POA: Diagnosis not present

## 2021-10-06 DIAGNOSIS — O210 Mild hyperemesis gravidarum: Secondary | ICD-10-CM | POA: Diagnosis not present

## 2021-10-06 DIAGNOSIS — R112 Nausea with vomiting, unspecified: Secondary | ICD-10-CM | POA: Insufficient documentation

## 2021-10-06 DIAGNOSIS — Z3A01 Less than 8 weeks gestation of pregnancy: Secondary | ICD-10-CM | POA: Diagnosis not present

## 2021-10-06 LAB — COMPREHENSIVE METABOLIC PANEL
ALT: 22 U/L (ref 0–44)
AST: 24 U/L (ref 15–41)
Albumin: 4.2 g/dL (ref 3.5–5.0)
Alkaline Phosphatase: 45 U/L (ref 38–126)
Anion gap: 11 (ref 5–15)
BUN: 11 mg/dL (ref 6–20)
CO2: 21 mmol/L — ABNORMAL LOW (ref 22–32)
Calcium: 9.4 mg/dL (ref 8.9–10.3)
Chloride: 100 mmol/L (ref 98–111)
Creatinine, Ser: 0.88 mg/dL (ref 0.44–1.00)
GFR, Estimated: >60 mL/min (ref >60)
Glucose, Bld: 90 mg/dL (ref 70–99)
Potassium: 3.1 mmol/L — ABNORMAL LOW (ref 3.5–5.1)
Sodium: 132 mmol/L — ABNORMAL LOW (ref 135–145)
Total Bilirubin: 0.5 mg/dL (ref 0.3–1.2)
Total Protein: 8.2 g/dL — ABNORMAL HIGH (ref 6.5–8.1)

## 2021-10-06 LAB — CBC WITH DIFFERENTIAL/PLATELET
Abs Immature Granulocytes: 0.01 10*3/uL (ref 0.00–0.07)
Basophils Absolute: 0 10*3/uL (ref 0.0–0.1)
Basophils Relative: 1 %
Eosinophils Absolute: 0 10*3/uL (ref 0.0–0.5)
Eosinophils Relative: 0 %
HCT: 39.8 % (ref 36.0–46.0)
Hemoglobin: 13.3 g/dL (ref 12.0–15.0)
Immature Granulocytes: 0 %
Lymphocytes Relative: 27 %
Lymphs Abs: 1.1 10*3/uL (ref 0.7–4.0)
MCH: 27.3 pg (ref 26.0–34.0)
MCHC: 33.4 g/dL (ref 30.0–36.0)
MCV: 81.7 fL (ref 80.0–100.0)
Monocytes Absolute: 0.6 10*3/uL (ref 0.1–1.0)
Monocytes Relative: 16 %
Neutro Abs: 2.3 10*3/uL (ref 1.7–7.7)
Neutrophils Relative %: 56 %
Platelets: 309 10*3/uL (ref 150–400)
RBC: 4.87 MIL/uL (ref 3.87–5.11)
RDW: 13.6 % (ref 11.5–15.5)
WBC: 4.1 10*3/uL (ref 4.0–10.5)
nRBC: 0 % (ref 0.0–0.2)

## 2021-10-06 LAB — HCG, QUANTITATIVE, PREGNANCY: hCG, Beta Chain, Quant, S: 71166 m[IU]/mL — ABNORMAL HIGH (ref <5)

## 2021-10-06 MED ORDER — MORPHINE SULFATE (PF) 4 MG/ML IV SOLN
4.0000 mg | Freq: Once | INTRAVENOUS | Status: AC
  Administered 2021-10-06: 4 mg via INTRAVENOUS
  Filled 2021-10-06: qty 1

## 2021-10-06 MED ORDER — ONDANSETRON HCL 4 MG/2ML IJ SOLN
4.0000 mg | Freq: Once | INTRAMUSCULAR | Status: AC
  Administered 2021-10-06: 4 mg via INTRAVENOUS
  Filled 2021-10-06: qty 2

## 2021-10-06 MED ORDER — SODIUM CHLORIDE 0.9 % IV BOLUS
1000.0000 mL | Freq: Once | INTRAVENOUS | Status: AC
  Administered 2021-10-06: 1000 mL via INTRAVENOUS

## 2021-10-06 NOTE — ED Triage Notes (Signed)
Pt states she was seen here x 2 days ago and diagnosed with colitis but pt states she is getting worse and unable to keep anything down

## 2021-10-06 NOTE — ED Provider Notes (Signed)
[facility] EMERGENCY DEPARTMENT Provider Note   CSN: [id number] Arrival date & time: 10/06/21  1938     History Chief Complaint  Patient presents with   Emesis    Patricia Horton is a 25 y.o. female.   Emesis  This patient is a 25 year old female, she has a history of being pregnant, she had a visit with her OB/GYN on 1 November, she had an emergency department visit on 5 November because of nausea vomiting and some abdominal discomfort.  During the course of that visit she was found to have possible mild colitis on CT scan and ultimately was discharged home with medications.  She has been taking the diplegia's, Augmentin, has not improved and in fact still continuing to have nausea and vomiting.  Symptoms are persistent, not associated with fevers, she was told to follow-up with her OB/GYN for repeat blood testing to make sure that her quantification was going up as there was concern for threatened miscarriage  Past Medical History:  Diagnosis Date   Migraine with aura 2014   NO ESTROGEN   Seizures (HCC)    last seizure 2017    Patient Active Problem List   Diagnosis Date Noted   Nausea 09/16/2021   Generalized abdominal pain 09/16/2021   Routine cervical smear 09/16/2021   Pregnancy examination or test, negative result 09/16/2021   Missed period 09/16/2021   Nexplanon insertion 07/28/2016   Migraine with aura 08/14/2014   Nexplanon in place 03/01/2013   Seizures (HCC) 03/01/2013    Past Surgical History:  Procedure Laterality Date   DILATION AND EVACUATION N/A 10/01/2020   Procedure: DILATATION AND EVACUATION;  Surgeon: Conan Bowens, MD;  Location: [facility];  Service: Gynecology;  Laterality: N/A;   NO PAST SURGERIES       OB History     Gravida  3   Para  0   Term  0   Preterm  0   AB  2   Living  0      SAB  2   IAB  0   Ectopic  0   Multiple  0   Live Births  0           Family History  Problem Relation Age of  Onset   Cancer Paternal Grandmother        breast   Heart disease Paternal Grandmother    Diabetes Mother    Stroke Mother    Hypertension Mother    Heart disease Paternal Aunt     Social History   Tobacco Use   Smoking status: Never   Smokeless tobacco: Never  Vaping Use   Vaping Use: Never used  Substance Use Topics   Alcohol use: Yes    Comment: occ   Drug use: No    Home Medications Prior to Admission medications   Medication Sig Start Date End Date Taking? Authorizing Provider  amoxicillin-clavulanate (AUGMENTIN) 875-125 MG tablet Take 1 tablet by mouth every 12 (twelve) hours. 10/04/21   Jacalyn Lefevre, MD  Doxylamine-Pyridoxine (DICLEGIS) 10-10 MG TBEC 2 tabs q hs, if sx persist add 1 tab q am on day 3, if sx persist add 1 tab q afternoon on day 4 09/29/21   Cheral Marker, CNM  progesterone (PROMETRIUM) 200 MG capsule Place 1 capsule (200 mg total) vaginally at bedtime. 09/30/21   Cheral Marker, CNM    Allergies    Patient has no known allergies.  Review of Systems  Review of Systems  Gastrointestinal:  Positive for vomiting.  All other systems reviewed and are negative.  Physical Exam Updated Vital Signs BP 111/73 (BP Location: Right Arm)   Pulse 82   Temp 98.4 F (36.9 C) (Oral)   Resp 17   LMP 07/30/2021   SpO2 100%   Physical Exam Vitals and nursing note reviewed.  Constitutional:      General: She is not in acute distress.    Appearance: She is well-developed.  HENT:     Head: Normocephalic and atraumatic.     Mouth/Throat:     Pharynx: No oropharyngeal exudate.  Eyes:     General: No scleral icterus.       Right eye: No discharge.        Left eye: No discharge.     Conjunctiva/sclera: Conjunctivae normal.     Pupils: Pupils are equal, round, and reactive to light.  Neck:     Thyroid: No thyromegaly.     Vascular: No JVD.  Cardiovascular:     Rate and Rhythm: Normal rate and regular rhythm.     Heart sounds: Normal heart  sounds. No murmur heard.   No friction rub. No gallop.  Pulmonary:     Effort: Pulmonary effort is normal. No respiratory distress.     Breath sounds: Normal breath sounds. No wheezing or rales.  Abdominal:     General: Bowel sounds are normal. There is no distension.     Palpations: Abdomen is soft. There is no mass.     Tenderness: There is abdominal tenderness.     Comments: Mild diffuse tenderness to palpation in the abdomen, no focal guarding or peritonitis  Musculoskeletal:        General: No tenderness. Normal range of motion.     Cervical back: Normal range of motion and neck supple.  Lymphadenopathy:     Cervical: No cervical adenopathy.  Skin:    General: Skin is warm and dry.     Findings: No erythema or rash.  Neurological:     Mental Status: She is alert.     Coordination: Coordination normal.  Psychiatric:        Behavior: Behavior normal.    ED Results / Procedures / Treatments   Labs (all labs ordered are listed, but only abnormal results are displayed) Labs Reviewed  HCG, QUANTITATIVE, PREGNANCY  CBC WITH DIFFERENTIAL/PLATELET  COMPREHENSIVE METABOLIC PANEL    EKG None  Radiology No results found.  Procedures Procedures   Medications Ordered in ED Medications  ondansetron (ZOFRAN) injection 4 mg (has no administration in time range)  sodium chloride 0.9 % bolus 1,000 mL (has no administration in time range)  morphine 4 MG/ML injection 4 mg (has no administration in time range)  sodium chloride 0.9 % bolus 1,000 mL (has no administration in time range)    ED Course  I have reviewed the triage vital signs and the nursing notes.  Pertinent labs & imaging results that were available during my care of the patient were reviewed by me and considered in my medical decision making (see chart for details).    MDM Rules/Calculators/A&P                           Ongoing symptoms including nausea and vomiting, she is now just dry heaving, her abdominal  exam is vague and nonspecific.  Given prior CT scan and labs I doubt that she has a perforation or  other complication of that initial CT scan and disease process.  We will repeat quantification, labs and fluids.  Patient agreeable  At the time of change of shift care signed out to oncoming emergency department physician to follow-up the results and disposition accordingly.  Final Clinical Impression(s) / ED Diagnoses Final diagnoses:  None    Rx / DC Orders ED Discharge Orders     None        Noemi Chapel, MD 10/09/21 1239

## 2021-10-07 MED ORDER — ONDANSETRON HCL 4 MG PO TABS: 4.0000 mg | ORAL_TABLET | Freq: Four times a day (QID) | ORAL | 0 refills | Status: DC | PRN

## 2021-10-07 NOTE — ED Provider Notes (Signed)
Signed out to me by Dr. [name] to follow-up on nausea, vomiting, abdominal pain in pregnancy.  hCG has appropriately doubled.  She has had ultrasound and CT scan.  I do not feel that there is any surgical emergency present.  Vital signs are normal.  No leukocytosis.  No fever.  Symptoms are most consistent with hyperemesis gravidarum.  She does seem a lot better after the Zofran.  Will provide a short course of Zofran and she needs to follow-up with OB/GYN for further instructions.  I did instruct her to take the Diclegis just in the morning and at night instead of just at night.   [name], MD 10/07/21 Patricia Horton

## 2021-10-07 NOTE — Discharge Instructions (Signed)
Follow-up with your OB/GYN by phone tomorrow.  Take the Diclegis just in the morning and at night.  Use Zofran as needed for breakthrough nausea and vomiting.

## 2021-10-13 ENCOUNTER — Emergency Department (HOSPITAL_COMMUNITY)
Admission: EM | Admit: 2021-10-13 | Discharge: 2021-10-13 | Disposition: A | Discharge status: Home / Self Care | Payer: Medicaid Other | Attending: Emergency Medicine | Admitting: Emergency Medicine

## 2021-10-13 ENCOUNTER — Emergency Department (HOSPITAL_COMMUNITY): Payer: Medicaid Other

## 2021-10-13 ENCOUNTER — Encounter (HOSPITAL_COMMUNITY): Payer: Self-pay | Admitting: *Deleted

## 2021-10-13 DIAGNOSIS — Z3A01 Less than 8 weeks gestation of pregnancy: Secondary | ICD-10-CM | POA: Insufficient documentation

## 2021-10-13 DIAGNOSIS — B9689 Other specified bacterial agents as the cause of diseases classified elsewhere: Secondary | ICD-10-CM | POA: Diagnosis not present

## 2021-10-13 DIAGNOSIS — N9489 Other specified conditions associated with female genital organs and menstrual cycle: Secondary | ICD-10-CM | POA: Insufficient documentation

## 2021-10-13 DIAGNOSIS — O210 Mild hyperemesis gravidarum: Secondary | ICD-10-CM | POA: Diagnosis not present

## 2021-10-13 DIAGNOSIS — R079 Chest pain, unspecified: Secondary | ICD-10-CM | POA: Diagnosis not present

## 2021-10-13 DIAGNOSIS — O219 Vomiting of pregnancy, unspecified: Secondary | ICD-10-CM | POA: Diagnosis present

## 2021-10-13 DIAGNOSIS — R1110 Vomiting, unspecified: Secondary | ICD-10-CM | POA: Diagnosis not present

## 2021-10-13 DIAGNOSIS — O23591 Infection of other part of genital tract in pregnancy, first trimester: Secondary | ICD-10-CM | POA: Insufficient documentation

## 2021-10-13 DIAGNOSIS — Z3490 Encounter for supervision of normal pregnancy, unspecified, unspecified trimester: Secondary | ICD-10-CM

## 2021-10-13 DIAGNOSIS — N760 Acute vaginitis: Secondary | ICD-10-CM | POA: Diagnosis not present

## 2021-10-13 LAB — CBC WITH DIFFERENTIAL/PLATELET
Abs Immature Granulocytes: 0.02 10*3/uL (ref 0.00–0.07)
Basophils Absolute: 0 10*3/uL (ref 0.0–0.1)
Basophils Relative: 0 %
Eosinophils Absolute: 0 10*3/uL (ref 0.0–0.5)
Eosinophils Relative: 0 %
HCT: 40.7 % (ref 36.0–46.0)
Hemoglobin: 13.8 g/dL (ref 12.0–15.0)
Immature Granulocytes: 0 %
Lymphocytes Relative: 19 %
Lymphs Abs: 1.4 10*3/uL (ref 0.7–4.0)
MCH: 27.5 pg (ref 26.0–34.0)
MCHC: 33.9 g/dL (ref 30.0–36.0)
MCV: 81.2 fL (ref 80.0–100.0)
Monocytes Absolute: 0.6 10*3/uL (ref 0.1–1.0)
Monocytes Relative: 9 %
Neutro Abs: 5.5 10*3/uL (ref 1.7–7.7)
Neutrophils Relative %: 72 %
Platelets: 354 10*3/uL (ref 150–400)
RBC: 5.01 MIL/uL (ref 3.87–5.11)
RDW: 13.3 % (ref 11.5–15.5)
WBC: 7.6 10*3/uL (ref 4.0–10.5)
nRBC: 0 % (ref 0.0–0.2)

## 2021-10-13 LAB — WET PREP, GENITAL
Sperm: NONE SEEN
Trich, Wet Prep: NONE SEEN
Yeast Wet Prep HPF POC: NONE SEEN

## 2021-10-13 LAB — URINALYSIS, ROUTINE W REFLEX MICROSCOPIC
Bacteria, UA: NONE SEEN
Bilirubin Urine: NEGATIVE
Glucose, UA: NEGATIVE mg/dL
Hgb urine dipstick: NEGATIVE
Ketones, ur: 80 mg/dL — AB
Leukocytes,Ua: NEGATIVE
Nitrite: NEGATIVE
Protein, ur: 30 mg/dL — AB
Specific Gravity, Urine: 1.029 (ref 1.005–1.030)
pH: 6 (ref 5.0–8.0)

## 2021-10-13 LAB — BASIC METABOLIC PANEL
Anion gap: 11 (ref 5–15)
BUN: 13 mg/dL (ref 6–20)
CO2: 18 mmol/L — ABNORMAL LOW (ref 22–32)
Calcium: 9.6 mg/dL (ref 8.9–10.3)
Chloride: 102 mmol/L (ref 98–111)
Creatinine, Ser: 0.74 mg/dL (ref 0.44–1.00)
GFR, Estimated: >60 mL/min (ref >60)
Glucose, Bld: 92 mg/dL (ref 70–99)
Potassium: 3.5 mmol/L (ref 3.5–5.1)
Sodium: 131 mmol/L — ABNORMAL LOW (ref 135–145)

## 2021-10-13 LAB — HCG, QUANTITATIVE, PREGNANCY: hCG, Beta Chain, Quant, S: 170598 m[IU]/mL — ABNORMAL HIGH (ref <5)

## 2021-10-13 MED ORDER — ALUM & MAG HYDROXIDE-SIMETH 200-200-20 MG/5ML PO SUSP
30.0000 mL | Freq: Once | ORAL | Status: AC
  Administered 2021-10-13: 30 mL via ORAL
  Filled 2021-10-13: qty 30

## 2021-10-13 MED ORDER — METRONIDAZOLE 500 MG PO TABS: 500.0000 mg | ORAL_TABLET | Freq: Two times a day (BID) | ORAL | 0 refills | Status: DC

## 2021-10-13 MED ORDER — LIDOCAINE VISCOUS HCL 2 % MT SOLN
15.0000 mL | Freq: Once | OROMUCOSAL | Status: AC
  Administered 2021-10-13: 15 mL via ORAL
  Filled 2021-10-13: qty 15

## 2021-10-13 MED ORDER — ONDANSETRON HCL 4 MG/2ML IJ SOLN
4.0000 mg | Freq: Once | INTRAMUSCULAR | Status: AC
  Administered 2021-10-13: 4 mg via INTRAVENOUS
  Filled 2021-10-13: qty 2

## 2021-10-13 MED ORDER — SODIUM CHLORIDE 0.9 % IV BOLUS
1000.0000 mL | Freq: Once | INTRAVENOUS | Status: AC
  Administered 2021-10-13: 1000 mL via INTRAVENOUS

## 2021-10-13 MED ORDER — MORPHINE SULFATE (PF) 4 MG/ML IV SOLN
2.0000 mg | Freq: Once | INTRAVENOUS | Status: AC
  Administered 2021-10-13: 2 mg via INTRAVENOUS
  Filled 2021-10-13: qty 1

## 2021-10-13 NOTE — ED Provider Notes (Signed)
[facility] EMERGENCY DEPARTMENT Provider Note   CSN: [id number] Arrival date & time: 10/13/21  1437     History Chief Complaint  Patient presents with   Emesis During Pregnancy    Patricia Horton is a 25 y.o. female with a history of migraine with aura, seizures.  Presents emergency department with a chief plaint of nausea, vomiting, and generalized abdominal pain.  Patient reports that she has had the symptoms constantly over the last 2 to 3 weeks.  Patient reports that she is unable to quantify the amount of time she sent her up in the last 24 hours.  Patient states "it has been a lot."  Patient describes emesis as green and clear.  Patient also reports seeing blood mixed in with her emesis.  Patient is unable to quantify the amount of blood but states "it has been bad."  She has been taking her Diclegis and Zofran without relief of symptoms.  Reports that symptoms are worse with p.o. intake.  Patient has had decreased p.o. intake since Wednesday due to her nausea and vomiting.  She reports abdominal pain is generalized throughout her entire abdomen.  Patient rates pain 8/10 on the pain scale.  Patient reports that pain has been constant.  Pain waxes and wanes in intensity.  Patient denies any aggravating factors.  Patient has not tried any modalities to alleviate her symptoms.  Patient also complains of vaginal discharge.  States that discharge has been "cloudy."  Patient denies any vaginal pain, vaginal bleeding, dysuria, hematuria, urinary urgency, constipation, diarrhea, fever, chills.    Patient reports that she is currently pregnant.  Per chart review previous ultrasound imaging marks pregnancy at 7 weeks.  G3 P0-0-2-0.  Patient is sexually active in a mutually monogamous relationship with a female partner, they do not use condoms with penetrative vaginal intercourse.  HPI     Past Medical History:  Diagnosis Date   Migraine with aura 2014   NO ESTROGEN   Seizures ([place])    last  seizure 2017    Patient Active Problem List   Diagnosis Date Noted   Nausea 09/16/2021   Generalized abdominal pain 09/16/2021   Routine cervical smear 09/16/2021   Pregnancy examination or test, negative result 09/16/2021   Missed period 09/16/2021   Nexplanon insertion 07/28/2016   Migraine with aura 08/14/2014   Nexplanon in place 03/01/2013   Seizures ([place]) 03/01/2013    Past Surgical History:  Procedure Laterality Date   DILATION AND EVACUATION N/A 10/01/2020   Procedure: DILATATION AND EVACUATION;  Surgeon: Sloan Leiter, MD;  Location: [place];  Service: Gynecology;  Laterality: N/A;   NO PAST SURGERIES       OB History     Gravida  3   Para  0   Term  0   Preterm  0   AB  2   Living  0      SAB  2   IAB  0   Ectopic  0   Multiple  0   Live Births  0           Family History  Problem Relation Age of Onset   Cancer Paternal Grandmother        breast   Heart disease Paternal Grandmother    Diabetes Mother    Stroke Mother    Hypertension Mother    Heart disease Paternal Aunt     Social History   Tobacco Use   Smoking  status: Never   Smokeless tobacco: Never  Vaping Use   Vaping Use: Never used  Substance Use Topics   Alcohol use: Yes    Comment: occ   Drug use: No    Home Medications Prior to Admission medications   Medication Sig Start Date End Date Taking? Authorizing Provider  amoxicillin-clavulanate (AUGMENTIN) 875-125 MG tablet Take 1 tablet by mouth every 12 (twelve) hours. 10/04/21   Jacalyn Lefevre, MD  Doxylamine-Pyridoxine (DICLEGIS) 10-10 MG TBEC 2 tabs q hs, if sx persist add 1 tab q am on day 3, if sx persist add 1 tab q afternoon on day 4 09/29/21   Cheral Marker, CNM  ondansetron (ZOFRAN) 4 MG tablet Take 1 tablet (4 mg total) by mouth every 6 (six) hours as needed for nausea or vomiting. 10/07/21   [name], MD  progesterone (PROMETRIUM) 200 MG capsule Place 1 capsule (200 mg  total) vaginally at bedtime. 09/30/21   Cheral Marker, CNM    Allergies    Patient has no known allergies.  Review of Systems   Review of Systems  Constitutional:  Negative for chills and fever.  Eyes:  Negative for visual disturbance.  Respiratory:  Negative for shortness of breath.   Cardiovascular:  Negative for chest pain.  Gastrointestinal:  Positive for abdominal pain, nausea and vomiting. Negative for abdominal distention, anal bleeding, blood in stool, constipation, diarrhea and rectal pain.  Genitourinary:  Positive for vaginal discharge. Negative for difficulty urinating, dysuria, flank pain, frequency, genital sores, hematuria, pelvic pain, urgency, vaginal bleeding and vaginal pain.  Musculoskeletal:  Negative for back pain and neck pain.  Skin:  Negative for color change and rash.  Neurological:  Positive for light-headedness. Negative for dizziness, syncope and headaches.  Psychiatric/Behavioral:  Negative for confusion.    Physical Exam Updated Vital Signs BP 115/81 (BP Location: Right Arm)   Pulse 71   Temp 97.9 F (36.6 C) (Oral)   Resp 18   LMP 07/30/2021   SpO2 99%   Physical Exam Vitals and nursing note reviewed. Exam conducted with a chaperone present (Female RN present as chaperone).  Constitutional:      General: She is not in acute distress.    Appearance: She is not ill-appearing, toxic-appearing or diaphoretic.  HENT:     Head: Normocephalic.  Eyes:     General: No scleral icterus.       Right eye: No discharge.        Left eye: No discharge.  Cardiovascular:     Rate and Rhythm: Normal rate.  Pulmonary:     Effort: Pulmonary effort is normal. No tachypnea, bradypnea or respiratory distress.     Breath sounds: Normal breath sounds. No stridor.  Abdominal:     General: Bowel sounds are normal. There is no distension. There are no signs of injury.     Palpations: Abdomen is soft. There is no mass or pulsatile mass.     Tenderness: There is  generalized abdominal tenderness. There is no right CVA tenderness, left CVA tenderness, guarding or rebound.     Hernia: There is no hernia in the umbilical area, ventral area, left inguinal area or right inguinal area.  Genitourinary:    Exam position: Lithotomy position.     Pubic Area: No rash or pubic lice.      Tanner stage (genital): 5.     Labia:        Right: No rash, tenderness, lesion or injury.  Left: No rash, tenderness, lesion or injury.      Vagina: No signs of injury and foreign body. Vaginal discharge present. No erythema, tenderness, bleeding, lesions or prolapsed vaginal walls.     Cervix: No cervical motion tenderness, discharge, friability, lesion, erythema, cervical bleeding or eversion.     Uterus: Not enlarged and not tender.      Adnexa: Right adnexa normal and left adnexa normal.     Comments: Copious white discharge noted to vaginal vault. Lymphadenopathy:     Lower Body: No right inguinal adenopathy. No left inguinal adenopathy.  Skin:    General: Skin is warm and dry.  Neurological:     General: No focal deficit present.     Mental Status: She is alert.     GCS: GCS eye subscore is 4. GCS verbal subscore is 5. GCS motor subscore is 6.  Psychiatric:        Behavior: Behavior is cooperative.    ED Results / Procedures / Treatments   Labs (all labs ordered are listed, but only abnormal results are displayed) Labs Reviewed  WET PREP, GENITAL - Abnormal; Notable for the following components:      Result Value   Clue Cells Wet Prep HPF POC PRESENT (*)    WBC, Wet Prep HPF POC FEW (*)    All other components within normal limits  BASIC METABOLIC PANEL - Abnormal; Notable for the following components:   Sodium 131 (*)    CO2 18 (*)    All other components within normal limits  URINALYSIS, ROUTINE W REFLEX MICROSCOPIC - Abnormal; Notable for the following components:   Ketones, ur 80 (*)    Protein, ur 30 (*)    All other components within normal  limits  HCG, QUANTITATIVE, PREGNANCY - Abnormal; Notable for the following components:   hCG, Beta Chain, Quant, S 170,598 (*)    All other components within normal limits  URINE CULTURE  CBC WITH DIFFERENTIAL/PLATELET  GC/CHLAMYDIA PROBE AMP ([place]) NOT AT [facility]    EKG None  Radiology No results found.  Procedures Procedures   Medications Ordered in ED Medications  sodium chloride 0.9 % bolus 1,000 mL (0 mLs Intravenous Stopped 10/13/21 1823)  ondansetron (ZOFRAN) injection 4 mg (4 mg Intravenous Given 10/13/21 1720)  alum & mag hydroxide-simeth (MAALOX/MYLANTA) 200-200-20 MG/5ML suspension 30 mL (30 mLs Oral Given 10/13/21 1924)    And  lidocaine (XYLOCAINE) 2 % viscous mouth solution 15 mL (15 mLs Oral Given 10/13/21 1924)  morphine 4 MG/ML injection 2 mg (2 mg Intravenous Given 10/13/21 1925)    ED Course  I have reviewed the triage vital signs and the nursing notes.  Pertinent labs & imaging results that were available during my care of the patient were reviewed by me and considered in my medical decision making (see chart for details).     MDM Rules/Calculators/A&P                           Alert 25 year old female no acute stress, nontoxic-appearing.  Patient is [redacted] weeks pregnant.  G3 P0020.  Presents with chief complaint of nausea, vomiting, and generalized abdominal pain.  Patient reports that symptoms have been constant over the last 2 to 3 weeks.  Patient endorses seeing blood in her emesis however is unable to quantify the amount.  Additionally patient endorses vaginal discharge.  Per chart review patient was seen on 11/7 for similar complaints.  Patient  had OB ultrasound imaging on 11/5 which showed findings suspicious for failed pregnancy but not yet definite.  Repeat ultrasound shows Early IUP, viability is uncertain 0000000 Estimated Date of Delivery: unsure Normal general sonographic findings   Based on LMP and current US findings, concern for pregnancy  failure vs early pregnancy. Would advise repeat US in 7-10 days   This recommendation follows SRU consensus guidelines: Diagnostic Criteria for Nonviable Pregnancy Early in the First Trimester. [employer] Med [id number].   On exam abdomen is soft, nondistended, generalized tenderness.  GU exam exam shows copious white discharge to vaginal vault.  No vaginal bleeding.  Cervical os closed.    Will give patient fluid bolus and Zofran for her symptoms.  Will obtain ultrasound imaging, CMP, CBC, quantitative hCG, urinalysis, wet prep, gonorrhea/committee testing.  Quantitative beta-hCG has continued to increase from previous lab results. PMP shows sodium slightly decreased at 131 CBC is unremarkable Urinalysis shows no signs of infection, ketones 80, protein 30.  Ketones present likely secondary to decreased p.o. intake. Wet prep shows bacterial vaginosis infection present.  Chest x-ray shows no pneumomediastinum.  Suspect that patient's visualized blood with emesis is secondary to Mallory-Weiss tear.  Discussed empiric treatment for gonorrhea and chlamydia with patient at this time.  Patient declines empiric treatment.  Discussed follow-up with PCP, OB/GYN provider, urgent care, or health department for positive test results.  Patient reports improvement in symptoms after receiving Zofran, GI cocktail, and morphine.  Patient able to handle p.o. intake without vomiting.  Patient hemodynamically stable.  Suspect possible hyperemesis gravidarum as cause of patient's nausea and vomiting.  Patient advised to continue Zofran and Diclegis.  Patient to follow-up closely with OB/GYN provider.  Due to patient's bacterial vaginosis we will start on 7-day course of Flagyl.  Discussed avoiding alcohol or alcohol containing products due to prescription of Flagyl.  Discussed results, findings, treatment and follow up. Patient advised of return precautions. Patient verbalized understanding and agreed with  plan.   Final Clinical Impression(s) / ED Diagnoses Final diagnoses:  Hyperemesis gravidarum  Bacterial vaginosis    Rx / DC Orders ED Discharge Orders          Ordered    metroNIDAZOLE (FLAGYL) 500 MG tablet  2 times daily        10/13/21 2017             Loni Beckwith, PA-C 10/14/21 0148    Margette Fast, MD 10/14/21 1055

## 2021-10-13 NOTE — Discharge Instructions (Addendum)
You came to the emergency department today to be evaluated for your nausea, vomiting, and abdominal pain.  Your physical exam was reassuring.  Your lab work showed that you have a bacterial vaginosis infection.  Due to this have started you on the medication metronidazole.  Please take this as prescribed.  Combined with alcohol this medication can cause violent nausea and vomiting.  Please refrain from drinking any alcohol or using any alcohol containing products.  Please follow-up with your OB/GYN provider for further management of your pregnancy, nausea, and vomiting.  Please continue to take your Diclegis and Zofran medication as prescribed.  Get help right away if: You cannot drink fluids without vomiting. You vomit blood. You have constant nausea and vomiting. You are very weak. You faint. You have a fever and your symptoms suddenly get worse.

## 2021-10-13 NOTE — ED Triage Notes (Signed)
Vomiting for the past 2 weeks, patient is pregnant

## 2021-10-13 NOTE — ED Notes (Signed)
Pt given cup of water at discharge

## 2021-10-13 NOTE — ED Notes (Signed)
Advised pt for need of urine specimen.

## 2021-10-14 ENCOUNTER — Other Ambulatory Visit: Payer: Self-pay | Admitting: Women's Health

## 2021-10-14 DIAGNOSIS — O3680X0 Pregnancy with inconclusive fetal viability, not applicable or unspecified: Secondary | ICD-10-CM

## 2021-10-14 LAB — GC/CHLAMYDIA PROBE AMP (~~LOC~~) NOT AT ARMC
Chlamydia: NEGATIVE
Comment: NEGATIVE
Comment: NORMAL
Neisseria Gonorrhea: NEGATIVE

## 2021-10-15 ENCOUNTER — Other Ambulatory Visit: Payer: Self-pay | Admitting: Advanced Practice Midwife

## 2021-10-15 ENCOUNTER — Ambulatory Visit (INDEPENDENT_AMBULATORY_CARE_PROVIDER_SITE_OTHER): Payer: Medicaid Other

## 2021-10-15 ENCOUNTER — Other Ambulatory Visit: Payer: Self-pay

## 2021-10-15 ENCOUNTER — Ambulatory Visit (INDEPENDENT_AMBULATORY_CARE_PROVIDER_SITE_OTHER): Payer: Medicaid Other | Admitting: Advanced Practice Midwife

## 2021-10-15 ENCOUNTER — Encounter: Payer: Self-pay | Admitting: Advanced Practice Midwife

## 2021-10-15 VITALS — BP 115/76 | HR 54 | Ht 63.0 in | Wt 174.0 lb

## 2021-10-15 DIAGNOSIS — O3680X0 Pregnancy with inconclusive fetal viability, not applicable or unspecified: Secondary | ICD-10-CM

## 2021-10-15 DIAGNOSIS — O210 Mild hyperemesis gravidarum: Secondary | ICD-10-CM | POA: Diagnosis not present

## 2021-10-15 DIAGNOSIS — Z3A01 Less than 8 weeks gestation of pregnancy: Secondary | ICD-10-CM

## 2021-10-15 LAB — URINE CULTURE

## 2021-10-15 MED ORDER — PROMETHAZINE HCL 25 MG PO TABS: 25.0000 mg | ORAL_TABLET | Freq: Four times a day (QID) | ORAL | 1 refills | Status: DC | PRN

## 2021-10-15 MED ORDER — ONDANSETRON 4 MG PO TBDP: 4.0000 mg | ORAL_TABLET | Freq: Three times a day (TID) | ORAL | 1 refills | Status: DC | PRN

## 2021-10-15 MED ORDER — PREDNISOLONE SODIUM PHOSPHATE 30 MG PO TBDP: 30.0000 mg | ORAL_TABLET | Freq: Every day | ORAL | 0 refills | Status: DC

## 2021-10-15 MED ORDER — BETAMETHASONE SOD PHOS & ACET 6 (3-3) MG/ML IJ SUSP
12.0000 mg | Freq: Once | INTRAMUSCULAR | Status: AC
  Administered 2021-10-15: 12 mg via INTRAMUSCULAR

## 2021-10-15 MED ORDER — PREDNISONE 50 MG PO TABS: ORAL_TABLET | ORAL | 0 refills | Status: DC

## 2021-10-15 MED ORDER — GLYCOPYRROLATE 1 MG PO TABS: 1.0000 mg | ORAL_TABLET | Freq: Three times a day (TID) | ORAL | 1 refills | Status: DC

## 2021-10-15 NOTE — Progress Notes (Addendum)
GYN VISIT Patient name: Patricia Horton MRN [id number]  Date of birth: 07-17-1996 Chief Complaint:   Hyperemesis Gravidarum  History of Present Illness:   Patricia Horton is a 25 y.o. G16P0020 African-American female being seen today for hyperemesis/spitting all day x a few weeks now. Has been to [facility] x 3 this month so far and has received fluids; lab review shows nl CBC with sodium 131 on BMET with 80 ketones. Has been using diclegis and Zofran so far. Weight 191lb (09/16/21) and today 174lb. Patient's last menstrual period was 07/30/2021.  Last pap Oct 2022. Results were: NILM w/ HRHPV negative  Depression screen [facility] 2/9 06/22/2013  Decreased Interest 2  Down, Depressed, Hopeless 0  PHQ - 2 Score 2  Altered sleeping 1  Tired, decreased energy 2  Change in appetite 0  Feeling bad or failure about yourself  0  Trouble concentrating 0  Moving slowly or fidgety/restless 0  Suicidal thoughts 0  PHQ-9 Score 5     GAD 7 : Generalized Anxiety Score 10/16/2020  Nervous, Anxious, on Edge 3  Control/stop worrying 3  Worry too much - different things 0  Trouble relaxing 2  Restless 0  Easily annoyed or irritable 3  Afraid - awful might happen 3  Total GAD 7 Score 14  Anxiety Difficulty Not difficult at all     Review of Systems:   Pertinent items are noted in HPI Denies fever/chills, dizziness, headaches, visual disturbances, fatigue, shortness of breath, chest pain, abdominal pain, vomiting, abnormal vaginal discharge/itching/odor/irritation, problems with periods, bowel movements, urination, or intercourse unless otherwise stated above.  Pertinent History Reviewed:  Reviewed past medical,surgical, social, obstetrical and family history.  Reviewed problem list, medications and allergies. Physical Assessment:   Vitals:   10/15/21 1229  BP: 115/76  Pulse: (!) 54  Weight: 174 lb (78.9 kg)  Height: 5\' 3"  (1.6 m)  Body mass index is 30.82 kg/m.       Physical Examination:    General appearance: alert, well appearing, and in no distress  Mental status: alert, oriented to person, place, and time  Skin: warm & dry   Cardiovascular: normal heart rate noted  Respiratory: normal respiratory effort, no distress  Abdomen: soft, non-tender   Pelvic: examination not indicated  Extremities: no edema   Viability scan today: 7+1 wks, single IUP with YS,CRL 10.16 mm,FHR 147 bpm,normal ovaries   No results found for this or any previous visit (from the past 24 hour(s)).  Assessment & Plan:  1) HG> 17lb weight loss since 10/18; prednisone 30mg  daily x 2wks with betamethasone today to start; rx Robinul, Phenergan, and Zofran ODT; will have 1wk f/u to eval for improvement   Meds: **changed Prednisone ODT 30mg  to Prednisone 50mg  Meds ordered this encounter  Medications   ondansetron (ZOFRAN ODT) 4 MG disintegrating tablet    Sig: Take 1 tablet (4 mg total) by mouth every 8 (eight) hours as needed for nausea or vomiting.    Dispense:  30 tablet    Refill:  1    Order Specific Question:   Supervising Provider    Answer:   11/18 H [[id number]]   promethazine (PHENERGAN) 25 MG tablet    Sig: Take 1 tablet (25 mg total) by mouth every 6 (six) hours as needed for nausea or vomiting.    Dispense:  30 tablet    Refill:  1    Order Specific Question:   Supervising Provider    Answer:  EURE, LUTHER H [[id number]]   DISCONTD: prednisoLONE (ORAPRED ODT) 30 MG disintegrating tablet    Sig: Take 1 tablet (30 mg total) by mouth daily.    Dispense:  30 tablet    Refill:  0    Order Specific Question:   Supervising Provider    Answer:   Duane Lope H [[id number]]   glycopyrrolate (ROBINUL) 1 MG tablet    Sig: Take 1 tablet (1 mg total) by mouth 3 (three) times daily.    Dispense:  90 tablet    Refill:  1    Order Specific Question:   Supervising Provider    Answer:   Duane Lope H [[id number]]   betamethasone acetate-betamethasone sodium phosphate (CELESTONE) injection 12 mg    No  orders of the defined types were placed in this encounter.   Return in about 1 week (around 10/22/2021) for f/u for nausea/vomiting; needs NOB in 3-4wks with NT u/s.  Arabella Merles CNM 10/15/2021 4:34 PM

## 2021-10-15 NOTE — Progress Notes (Addendum)
[place] 7+1 wks single IUP with YS,CRL 10.16 mm,FHR 147 bpm,normal ovaries

## 2021-10-15 NOTE — Addendum Note (Signed)
Addended by: Annamarie Dawley on: 10/15/2021 01:30 PM   Modules accepted: Orders

## 2021-10-20 ENCOUNTER — Other Ambulatory Visit: Payer: Medicaid Other

## 2021-10-22 ENCOUNTER — Ambulatory Visit (INDEPENDENT_AMBULATORY_CARE_PROVIDER_SITE_OTHER): Payer: Medicaid Other | Admitting: Advanced Practice Midwife

## 2021-10-22 ENCOUNTER — Other Ambulatory Visit: Payer: Self-pay

## 2021-10-22 ENCOUNTER — Encounter: Payer: Self-pay | Admitting: Advanced Practice Midwife

## 2021-10-22 VITALS — BP 122/77 | HR 99 | Ht 63.5 in | Wt 184.0 lb

## 2021-10-22 DIAGNOSIS — Z3A08 8 weeks gestation of pregnancy: Secondary | ICD-10-CM

## 2021-10-22 DIAGNOSIS — O210 Mild hyperemesis gravidarum: Secondary | ICD-10-CM

## 2021-10-22 DIAGNOSIS — R635 Abnormal weight gain: Secondary | ICD-10-CM

## 2021-10-22 MED ORDER — ONDANSETRON 4 MG PO TBDP: 4.0000 mg | ORAL_TABLET | Freq: Four times a day (QID) | ORAL | 3 refills | Status: DC | PRN

## 2021-10-22 MED ORDER — PROMETHAZINE HCL 25 MG PO TABS: 25.0000 mg | ORAL_TABLET | Freq: Four times a day (QID) | ORAL | 3 refills | Status: DC | PRN

## 2021-10-22 NOTE — Patient Instructions (Signed)
Vomiting in First Trimester Follow these instructions at home: To help relieve your symptoms, listen to your body. Everyone is different and has different preferences. Find what works best for you. Here are some things you can try to help relieve your symptoms: Meals and snacks Eat 5-6 small meals daily instead of 3 large meals. Eating small meals and snacks can help you avoid an empty stomach. Before getting out of bed, eat a couple of crackers to avoid moving around on an empty stomach. Eat a protein-rich snack before bed. Examples include cheese and crackers, or a peanut butter sandwich made with 1 slice of whole-wheat bread and 1 tsp (5 g) of peanut butter. Eat and drink slowly. Try eating starchy foods as these are usually tolerated well. Examples include cereal, toast, bread, potatoes, pasta, rice, and pretzels. Eat at least one serving of protein with your meals and snacks. Protein options include lean meats, poultry, seafood, beans, nuts, nut butters, eggs, cheese, and yogurt. Eat or suck on things that have ginger in them. It may help to relieve nausea. Add  tsp (0.44 g) ground ginger to hot tea, or choose ginger tea.   Fluids It is important to stay hydrated. Try to: Drink small amounts of fluids often. Drink fluids 30 minutes before or after a meal to help lessen the feeling of a full stomach. Drink 100% fruit juice or an electrolyte drink. An electrolyte drink contains sodium, potassium, and chloride. Drink fluids that are cold, clear, and carbonated or sour. These include lemonade, ginger ale, lemon-lime soda, ice water, and sparkling water. Things to avoid Avoid the following: Eating foods that trigger your symptoms. These may include spicy foods, coffee, high-fat foods, very sweet foods, and acidic foods. Drinking more than 1 cup of fluid at a time. Skipping meals. Nausea can be more intense on an empty stomach. If you cannot tolerate food, do not force it. Try sucking on ice  chips or other frozen items and make up for missed calories later. Lying down within 2 hours after eating. Being exposed to environmental triggers. These may include food smells, smoky rooms, closed spaces, rooms with strong smells, warm or humid places, overly loud and noisy rooms, and rooms with motion or flickering lights. Try eating meals in a well-ventilated area that is free of strong smells. Making quick and sudden changes in your movement. Taking iron pills and multivitamins that contain iron. If you take prescription iron pills, do not stop taking them unless your health care provider approves. Preparing food. The smell of food can spoil your appetite or trigger nausea. General instructions Brush your teeth or use a mouth rinse after meals. Take over-the-counter and prescription medicines only as told by your health care provider. Follow instructions from your health care provider about eating or drinking restrictions. Talk with your health care provider about starting a supplement of vitamin B6. Continue to take your prenatal vitamins as told by your health care provider. If you are having trouble taking your prenatal vitamins, talk with your health care provider about other options. Keep all follow-up visits. This is important. Follow-up visits include prenatal visits. Contact a health care provider if: You have pain in your abdomen. You have a severe headache. You have vision problems. You are losing weight. You feel weak or dizzy. You cannot eat or drink without vomiting, especially if this goes on for a full day. Get help right away if: You cannot drink fluids without vomiting. You vomit blood. You have constant   nausea and vomiting. You are very weak. You faint. You have a fever and your symptoms suddenly get worse. Summary Making some changes to your eating habits may help relieve nausea and vomiting. This condition may be managed with lifestyle changes and medicines as  prescribed by your health care provider. If medicines do not help relieve nausea and vomiting, you may need to receive fluids through an IV at the hospital. This information is not intended to replace advice given to you by your health care provider. Make sure you discuss any questions you have with your health care provider. Document Revised: 06/10/2020 Document Reviewed: 06/10/2020 Elsevier Patient Education  2021 [employer]  

## 2021-10-22 NOTE — Progress Notes (Signed)
   PRENATAL VISIT NOTE  Subjective:  Patricia Horton is a 25 y.o. G3P0020 at [redacted]w[redacted]d being seen today for follow-up to change in treatment plan for previously diagnosed Hyperemesis Gravidarum.  She is currently monitored for the following issues for this low-risk pregnancy and has Seizures (HCC); Migraine with aura; and Hyperemesis gravidarum on their problem list.  Patient reports no complaints. She is compliant with all medications as prescribed and states she "feels like doing cartwheels". She denies any food intolerance and states she has been "eating everything" and "whatever  want".  The following portions of the patient's history were reviewed and updated as appropriate: allergies, current medications, past family history, past medical history, past social history, past surgical history and problem list. Problem list updated.  Objective:   Vitals:   10/22/21 0924  BP: 122/77  Pulse: 99  Weight: 184 lb (83.5 kg)  Height: 5' 3.5" (1.613 m)    Fetal Status:           General:  Alert, oriented and cooperative. Patient is in no acute distress.  Skin: Skin is warm and dry. No rash noted.   Cardiovascular: Normal heart rate noted  Respiratory: Normal respiratory effort, no problems with respiration noted  Abdomen: Soft, gravid, appropriate for gestational age.        Pelvic: Cervical exam deferred        Extremities: Normal range of motion.     Mental Status: Normal mood and affect. Normal behavior. Normal judgment and thought content.   Assessment and Plan:  Pregnancy: G3P0020 at [redacted]w[redacted]d  1. Hyperemesis gravidarum - Well managed - Added refills to Phenergan and Zofran ODT - Reviewed data on Zofran in first trimester, previously discussed with patient --New OB scheduled for 12/27  2. [redacted] weeks gestation of pregnancy   3. Recent weight gain - 10 lb weight gain from previous visit  Preterm labor symptoms and general obstetric precautions including but not limited to vaginal  bleeding, contractions, leaking of fluid and fetal movement were reviewed in detail with the patient. Please refer to After Visit Summary for other counseling recommendations.  Return if symptoms worsen or fail to improve.  Future Appointments  Date Time Provider Department Center  11/25/2021  2:00 PM [facility] [place] [facility] None  11/25/2021  2:30 PM Moss Mc, RN [facility]  11/25/2021  3:10 PM Cheral Marker, CNM [facility]    Calvert Cantor, CNM

## 2021-10-28 ENCOUNTER — Ambulatory Visit: Payer: Medicaid Other | Admitting: Adult Health

## 2021-11-14 ENCOUNTER — Other Ambulatory Visit: Payer: Self-pay | Admitting: Obstetrics & Gynecology

## 2021-11-14 DIAGNOSIS — Z3682 Encounter for antenatal screening for nuchal translucency: Secondary | ICD-10-CM

## 2021-11-21 ENCOUNTER — Telehealth: Payer: Self-pay

## 2021-11-21 ENCOUNTER — Other Ambulatory Visit: Payer: Self-pay

## 2021-11-21 ENCOUNTER — Ambulatory Visit (INDEPENDENT_AMBULATORY_CARE_PROVIDER_SITE_OTHER): Payer: Medicaid Other | Admitting: Obstetrics & Gynecology

## 2021-11-21 ENCOUNTER — Encounter: Payer: Self-pay | Admitting: Obstetrics & Gynecology

## 2021-11-21 VITALS — BP 118/87 | HR 84 | Ht 63.0 in | Wt 185.0 lb

## 2021-11-21 DIAGNOSIS — R200 Anesthesia of skin: Secondary | ICD-10-CM | POA: Diagnosis not present

## 2021-11-21 MED ORDER — PREDNISONE 10 MG PO TABS: ORAL_TABLET | ORAL | 0 refills | Status: DC

## 2021-11-21 MED ORDER — CYCLOBENZAPRINE HCL 10 MG PO TABS: 10.0000 mg | ORAL_TABLET | Freq: Three times a day (TID) | ORAL | 1 refills | Status: DC | PRN

## 2021-11-21 NOTE — Telephone Encounter (Signed)
Pt called with c/o tingling, numbness, and pain in her legs bilaterally. She has dealt with this issue since her late teens, but never followed-up since the advice the ED gave her has worked over the years. The pain worsened last night and woke the pt several times during the night. She ate 3 pomegranates this morning as previously advised (ED) with no relief. Joellyn Haff advised the pt be seen by Dr Despina Hidden. Pt will come around 10 AM.

## 2021-11-21 NOTE — Progress Notes (Signed)
Chief Complaint  Patient presents with   Leg Pain    Numbness tingling waist down.       25 y.o. G3P0020 Patient's last menstrual period was 07/30/2021. The current method of family planning is pt is pregnant.  Outpatient Encounter Medications as of 11/21/2021  Medication Sig   cyclobenzaprine (FLEXERIL) 10 MG tablet Take 1 tablet (10 mg total) by mouth every 8 (eight) hours as needed for muscle spasms.   Doxylamine-Pyridoxine (DICLEGIS) 10-10 MG TBEC 2 tabs q hs, if sx persist add 1 tab q am on day 3, if sx persist add 1 tab q afternoon on day 4   glycopyrrolate (ROBINUL) 1 MG tablet Take 1 tablet (1 mg total) by mouth 3 (three) times daily.   ondansetron (ZOFRAN ODT) 4 MG disintegrating tablet Take 1 tablet (4 mg total) by mouth every 6 (six) hours as needed for nausea or vomiting.   predniSONE (DELTASONE) 10 MG tablet Take 4 tablets all at once daily for 10 days   Prenatal MV & Min w/FA-DHA (PRENATAL ADULT GUMMY/DHA/FA PO) Take by mouth. Takes 2 daily   progesterone (PROMETRIUM) 200 MG capsule Place 1 capsule (200 mg total) vaginally at bedtime.   promethazine (PHENERGAN) 25 MG tablet Take 1 tablet (25 mg total) by mouth every 6 (six) hours as needed for nausea or vomiting.   ondansetron (ZOFRAN) 4 MG tablet Take 1 tablet (4 mg total) by mouth every 6 (six) hours as needed for nausea or vomiting. (Patient not taking: Reported on 11/21/2021)   [DISCONTINUED] predniSONE (DELTASONE) 50 MG tablet Take one tab by mouth daily (Patient not taking: Reported on 11/21/2021)   No facility-administered encounter medications on file as of 11/21/2021.    Subjective [redacted]w[redacted]d Estimated Date of Delivery: 06/02/22  FHR 160s by Doppler today No vaginal bleeding  Pt with episodic numbness and discomfort in boh lower extremities Pt has been seen in ED in remote past and told to drink pomegrnate juice which always has helped The distribution is bilateral  She drank the juice yesterday without  success  No work up has been done Hx of ADHD, headaches Seizures as a child had eval but never on meds that she knows  Denies a history of double vision  Past Medical History:  Diagnosis Date   Migraine with aura 2014   NO ESTROGEN   Seizures (HCC)    last seizure 2017    Past Surgical History:  Procedure Laterality Date   DILATION AND EVACUATION N/A 10/01/2020   Procedure: DILATATION AND EVACUATION;  Surgeon: Conan Bowens, MD;  Location: [facility];  Service: Gynecology;  Laterality: N/A;   NO PAST SURGERIES      OB History     Gravida  3   Para  0   Term  0   Preterm  0   AB  2   Living  0      SAB  2   IAB  0   Ectopic  0   Multiple  0   Live Births  0           No Known Allergies  Social History   Socioeconomic History   Marital status: Single    Spouse name: Not on file   Number of children: Not on file   Years of education: Not on file   Highest education level: Not on file  Occupational History   Not on file  Tobacco Use  Smoking status: Never   Smokeless tobacco: Never  Vaping Use   Vaping Use: Never used  Substance and Sexual Activity   Alcohol use: Not Currently    Comment: occ   Drug use: No   Sexual activity: Yes    Birth control/protection: None  Other Topics Concern   Not on file  Social History Narrative   Not on file   Social Determinants of Health   Financial Resource Strain: Not on file  Food Insecurity: Not on file  Transportation Needs: Not on file  Physical Activity: Not on file  Stress: Not on file  Social Connections: Not on file    Family History  Problem Relation Age of Onset   Cancer Paternal Grandmother        breast   Heart disease Paternal Grandmother    Diabetes Mother    Stroke Mother    Hypertension Mother    Heart disease Paternal Aunt     Medications:       Current Outpatient Medications:    cyclobenzaprine (FLEXERIL) 10 MG tablet, Take 1 tablet (10 mg total) by  mouth every 8 (eight) hours as needed for muscle spasms., Disp: 30 tablet, Rfl: 1   Doxylamine-Pyridoxine (DICLEGIS) 10-10 MG TBEC, 2 tabs q hs, if sx persist add 1 tab q am on day 3, if sx persist add 1 tab q afternoon on day 4, Disp: 100 tablet, Rfl: 6   glycopyrrolate (ROBINUL) 1 MG tablet, Take 1 tablet (1 mg total) by mouth 3 (three) times daily., Disp: 90 tablet, Rfl: 1   ondansetron (ZOFRAN ODT) 4 MG disintegrating tablet, Take 1 tablet (4 mg total) by mouth every 6 (six) hours as needed for nausea or vomiting., Disp: 30 tablet, Rfl: 3   predniSONE (DELTASONE) 10 MG tablet, Take 4 tablets all at once daily for 10 days, Disp: 40 tablet, Rfl: 0   Prenatal MV & Min w/FA-DHA (PRENATAL ADULT GUMMY/DHA/FA PO), Take by mouth. Takes 2 daily, Disp: , Rfl:    progesterone (PROMETRIUM) 200 MG capsule, Place 1 capsule (200 mg total) vaginally at bedtime., Disp: 20 capsule, Rfl: 3   promethazine (PHENERGAN) 25 MG tablet, Take 1 tablet (25 mg total) by mouth every 6 (six) hours as needed for nausea or vomiting., Disp: 30 tablet, Rfl: 3   ondansetron (ZOFRAN) 4 MG tablet, Take 1 tablet (4 mg total) by mouth every 6 (six) hours as needed for nausea or vomiting. (Patient not taking: Reported on 11/21/2021), Disp: 15 tablet, Rfl: 0  Objective Blood pressure 118/87, pulse 84, height 5\' 3"  (1.6 m), weight 185 lb (83.9 kg), last menstrual period 07/30/2021.  No decrease in strength or pain seems to be hypersensitive to touch FHR 160s  Pertinent ROS No burning with urination, frequency or urgency No nausea, vomiting or diarrhea Nor fever chills or other constitutional symptoms   Labs or studies Reviewed ED visits to age 62 MRI many years ago normal    Impression Diagnoses this Encounter::   ICD-10-CM   1. Lower extremity numbness, bilateral  R20.0    describes it mostly as a dysthesia    Distribution of symptoms do not suggest disc or sciatica, seems more central, could be a conversion type  disorder  Established relevant diagnosis(es):   Plan/Recommendations: Meds ordered this encounter  Medications   cyclobenzaprine (FLEXERIL) 10 MG tablet    Sig: Take 1 tablet (10 mg total) by mouth every 8 (eight) hours as needed for muscle spasms.    Dispense:  30 tablet    Refill:  1   predniSONE (DELTASONE) 10 MG tablet    Sig: Take 4 tablets all at once daily for 10 days    Dispense:  40 tablet    Refill:  0    Labs or Scans Ordered: No orders of the defined types were placed in this encounter.   Management:: Will manage with steroid pulse and flexeril At some point needs a neurologic work up, not acute, doesn't seem to be consistent with MS, this is not GB syndrome  Follow up Keep appt      All questions were answered.

## 2021-11-25 ENCOUNTER — Other Ambulatory Visit: Payer: Self-pay

## 2021-11-25 ENCOUNTER — Ambulatory Visit (INDEPENDENT_AMBULATORY_CARE_PROVIDER_SITE_OTHER): Payer: Medicaid Other

## 2021-11-25 ENCOUNTER — Encounter: Payer: Self-pay | Admitting: Women's Health

## 2021-11-25 ENCOUNTER — Ambulatory Visit: Payer: Medicaid Other | Admitting: *Deleted

## 2021-11-25 ENCOUNTER — Ambulatory Visit (INDEPENDENT_AMBULATORY_CARE_PROVIDER_SITE_OTHER): Payer: Medicaid Other | Admitting: Women's Health

## 2021-11-25 VITALS — BP 115/72 | HR 79 | Wt 188.0 lb

## 2021-11-25 DIAGNOSIS — Z6791 Unspecified blood type, Rh negative: Secondary | ICD-10-CM | POA: Insufficient documentation

## 2021-11-25 DIAGNOSIS — Z3A13 13 weeks gestation of pregnancy: Secondary | ICD-10-CM

## 2021-11-25 DIAGNOSIS — Z3682 Encounter for antenatal screening for nuchal translucency: Secondary | ICD-10-CM

## 2021-11-25 DIAGNOSIS — Z3480 Encounter for supervision of other normal pregnancy, unspecified trimester: Secondary | ICD-10-CM

## 2021-11-25 DIAGNOSIS — O26899 Other specified pregnancy related conditions, unspecified trimester: Secondary | ICD-10-CM | POA: Diagnosis not present

## 2021-11-25 DIAGNOSIS — Z3481 Encounter for supervision of other normal pregnancy, first trimester: Secondary | ICD-10-CM

## 2021-11-25 DIAGNOSIS — O210 Mild hyperemesis gravidarum: Secondary | ICD-10-CM

## 2021-11-25 DIAGNOSIS — Z3490 Encounter for supervision of normal pregnancy, unspecified, unspecified trimester: Secondary | ICD-10-CM | POA: Insufficient documentation

## 2021-11-25 LAB — POCT URINALYSIS DIPSTICK OB
Blood, UA: NEGATIVE
Glucose, UA: NEGATIVE
Ketones, UA: NEGATIVE
Leukocytes, UA: NEGATIVE
Nitrite, UA: NEGATIVE
POC,PROTEIN,UA: NEGATIVE

## 2021-11-25 MED ORDER — ASPIRIN 81 MG PO TBEC: 81.0000 mg | DELAYED_RELEASE_TABLET | Freq: Every day | ORAL | 3 refills | Status: DC

## 2021-11-25 NOTE — Progress Notes (Signed)
INITIAL OBSTETRICAL VISIT Patient name: Patricia Horton MRN [id number]  Date of birth: August 03, 1996 Chief Complaint:   Initial Prenatal Visit  History of Present Illness:   Patricia Horton is a 25 y.o. G73P0020 African-American female at [redacted]w[redacted]d by [place] at 7 weeks with an Estimated Date of Delivery: 06/02/22 being seen today for her initial obstetrical visit.   Patient's last menstrual period was 07/30/2021. Her obstetrical history is significant for  SAB x 2 .   Today she reports  on multiple meds for hyperemesis, doing ok w/ n/v . H/o seizures, never on meds, last seizure few years ago. Seen by [facility] in past, hasn't seen them in a few years.  Saw Dr. Despina Hidden 12/23 for leg pain, numbness, rx'd flexeril and prednisone, doing much better.   Last pap 09/16/21. Results were: NILM w/ HRHPV negative  Depression screen [facility] 2/9 11/25/2021 06/22/2013  Decreased Interest 2 2  Down, Depressed, Hopeless 1 0  PHQ - 2 Score 3 2  Altered sleeping 2 1  Tired, decreased energy 3 2  Change in appetite 2 0  Feeling bad or failure about yourself  1 0  Trouble concentrating 0 0  Moving slowly or fidgety/restless 0 0  Suicidal thoughts 0 0  PHQ-9 Score 11 5     GAD 7 : Generalized Anxiety Score 11/25/2021 10/16/2020  Nervous, Anxious, on Edge 1 3  Control/stop worrying 0 3  Worry too much - different things 0 0  Trouble relaxing 2 2  Restless 2 0  Easily annoyed or irritable 1 3  Afraid - awful might happen 0 3  Total GAD 7 Score 6 14  Anxiety Difficulty - Not difficult at all     Review of Systems:   Pertinent items are noted in HPI Denies cramping/contractions, leakage of fluid, vaginal bleeding, abnormal vaginal discharge w/ itching/odor/irritation, headaches, visual changes, shortness of breath, chest pain, abdominal pain, severe nausea/vomiting, or problems with urination or bowel movements unless otherwise stated above.  Pertinent History Reviewed:  Reviewed past medical,surgical, social,  obstetrical and family history.  Reviewed problem list, medications and allergies. OB History  Gravida Para Term Preterm AB Living  3 0 0 0 2 0  SAB IAB Ectopic Multiple Live Births  2 0 0 0 0    # Outcome Date GA Lbr Len/2nd Weight Sex Delivery Anes PTL Lv  3 Current           2 SAB 10/01/20 [redacted]w[redacted]d         1 SAB            Physical Assessment:   Vitals:   11/25/21 1448  BP: 115/72  Pulse: 79  Weight: 188 lb (85.3 kg)  Body mass index is 33.3 kg/m.       Physical Examination:  General appearance - well appearing, and in no distress  Mental status - alert, oriented to person, place, and time  Psych:  She has a normal mood and affect  Skin - warm and dry, normal color, no suspicious lesions noted  Chest - effort normal, all lung fields clear to auscultation bilaterally  Heart - normal rate and regular rhythm  Abdomen - soft, nontender  Extremities:  No swelling or varicosities noted  Thin prep pap is not done   Chaperone: N/A    TODAY'S NT [place] 13 wks,measurements c/w dates,CRL 68.24 mm,normal ovaries,fhr 141 bpm,NB present,NT 1.5 mm,posterior placenta   Results for orders placed or performed in visit on  11/25/21 (from the past 24 hour(s))  POC Urinalysis Dipstick OB   Collection Time: 11/25/21  3:07 PM  Result Value Ref Range   Color, UA     Clarity, UA     Glucose, UA Negative Negative   Bilirubin, UA     Ketones, UA neg    Spec Grav, UA     Blood, UA neg    pH, UA     POC,PROTEIN,UA Negative Negative, Trace, Small (1+), Moderate (2+), Large (3+), 4+   Urobilinogen, UA     Nitrite, UA neg    Leukocytes, UA Negative Negative   Appearance     Odor      Assessment & Plan:  1) Low-Risk Pregnancy G3P0020 at [redacted]w[redacted]d with an Estimated Date of Delivery: 06/02/22   2) Initial OB visit  3) H/O seizures> never on meds, last seizures few years ago  4) Hyperemesis> doing well on current meds, has gained weight  5) Recent visit for bilateral leg pain/numbness> improved on  flexeril and prednisone,  to call and make appt w/ [facility] per [facility]  Meds:  Meds ordered this encounter  Medications   aspirin 81 MG EC tablet    Sig: Take 1 tablet (81 mg total) by mouth daily. Swallow whole.    Dispense:  90 tablet    Refill:  3    Order Specific Question:   Supervising Provider    Answer:   Duane Lope H [[id number]]    Initial labs obtained Continue prenatal vitamins Reviewed n/v relief measures and warning s/s to report Reviewed recommended weight gain based on pre-gravid BMI Encouraged well-balanced diet Genetic & carrier screening discussed: requests Panorama, NT/IT, and Horizon  Ultrasound discussed; fetal survey: requested CCNC completed> form faxed if has or is planning to apply for medicaid The nature of [facility] [employer] with multiple MDs and other Advanced Practice Providers was explained to patient; also emphasized that fellows, residents, and students are part of our team. Does not have home bp cuff. Office bp cuff given: no.   Indications for ASA therapy (per uptodate) OR Two or more of the following: Nulliparity Yes Obesity (BMI>30 kg/m2) Yes Sociodemographic characteristics (African American race, low socioeconomic level) Yes  Follow-up: Return in about 3 weeks (around 12/16/2021) for [facility], MD or CNM, in person, 2nd IT.   Orders Placed This Encounter  Procedures   Urine Culture   GC/Chlamydia Probe Amp   Integrated 1   Pain Management Screening Profile (10S)   Genetic Screening   CBC/D/Plt+RPR+Rh+ABO+RubIgG...   POC Urinalysis Dipstick OB    Cheral Marker CNM, [facility] 11/25/2021 3:41 PM

## 2021-11-25 NOTE — Patient Instructions (Signed)
Patricia Horton, thank you for choosing our office today! We appreciate the opportunity to meet your healthcare needs. You may receive a short survey by mail, e-mail, or through MyChart. If you are happy with your care we would appreciate if you could take just a few minutes to complete the survey questions. We read all of your comments and take your feedback very seriously. Thank you again for choosing our office.  [facility]  [facility] ([address] [place], [place] [address]) Entrance C, located off of [address] Free 24/7 valet parking   Nausea & Vomiting Have saltine crackers or pretzels by your bed and eat a few bites before you raise your head out of bed in the morning Eat small frequent meals throughout the day instead of large meals Drink plenty of fluids throughout the day to stay hydrated, just don't drink a lot of fluids with your meals.  This can make your stomach fill up faster making you feel sick Do not brush your teeth right after you eat Products with real ginger are good for nausea, like ginger ale and ginger hard candy Make sure it says made with real ginger! Sucking on sour candy like lemon heads is also good for nausea If your prenatal vitamins make you nauseated, take them at night so you will sleep through the nausea Sea Bands If you feel like you need medicine for the nausea & vomiting please let us know If you are unable to keep any fluids or food down please let us know   Constipation Drink plenty of fluid, preferably water, throughout the day Eat foods high in fiber such as fruits, vegetables, and grains Exercise, such as walking, is a good way to keep your bowels regular Drink warm fluids, especially warm prune juice, or decaf coffee Eat a 1/2 cup of real oatmeal (not instant), 1/2 cup applesauce, and 1/2-1 cup warm prune juice every day If needed, you may take Colace (docusate sodium) stool  softener once or twice a day to help keep the stool soft.  If you still are having problems with constipation, you may take Miralax once daily as needed to help keep your bowels regular.   Home Blood Pressure Monitoring for Patients   Your provider has recommended that you check your blood pressure (BP) at least once a week at home. If you do not have a blood pressure cuff at home, one will be provided for you. Contact your provider if you have not received your monitor within 1 week.   Helpful Tips for Accurate Home Blood Pressure Checks  Don't smoke, exercise, or drink caffeine 30 minutes before checking your BP Use the restroom before checking your BP (a full bladder can raise your pressure) Relax in a comfortable upright chair Feet on the ground Left arm resting comfortably on a flat surface at the level of your heart Legs uncrossed Back supported Sit quietly and don't talk Place the cuff on your bare arm Adjust snuggly, so that only two fingertips can fit between your skin and the top of the cuff Check 2 readings separated by at least one minute Keep a log of your BP readings For a visual, please reference this diagram: http://ccnc.care/bpdiagram  Provider Name: [facility]     Phone: [phone]  Zone 1: ALL CLEAR  Continue to monitor your symptoms:  BP reading is less than 140 (top number) or less than 90 (bottom   number)  °No right upper stomach pain °No headaches or seeing spots °No feeling nauseated or throwing up °No swelling in face and hands ° °Zone 2: CAUTION °Call your doctor's office for any of the following:  °BP reading is greater than 140 (top number) or greater than 90 (bottom number)  °Stomach pain under your ribs in the middle or right side °Headaches or seeing spots °Feeling nauseated or throwing up °Swelling in face and hands ° °Zone 3: EMERGENCY  °Seek immediate medical care if you have any of the following:  °BP reading is greater than160 (top number) or  greater than 110 (bottom number) °Severe headaches not improving with Tylenol °Serious difficulty catching your breath °Any worsening symptoms from Zone 2  ° ° First Trimester of Pregnancy °The first trimester of pregnancy is from week 1 until the end of week 12 (months 1 through 3). A week after a sperm fertilizes an egg, the egg will implant on the wall of the uterus. This embryo will begin to develop into a baby. Genes from you and your partner are forming the baby. The female genes determine whether the baby is a boy or a girl. At 6-8 weeks, the eyes and face are formed, and the heartbeat can be seen on ultrasound. At the end of 12 weeks, all the baby's organs are formed.  °Now that you are pregnant, you will want to do everything you can to have a healthy baby. Two of the most important things are to get good prenatal care and to follow your health care provider's instructions. Prenatal care is all the medical care you receive before the baby's birth. This care will help prevent, find, and treat any problems during the pregnancy and childbirth. °BODY CHANGES °Your body goes through many changes during pregnancy. The changes vary from woman to woman.  °You may gain or lose a couple of pounds at first. °You may feel sick to your stomach (nauseous) and throw up (vomit). If the vomiting is uncontrollable, call your health care provider. °You may tire easily. °You may develop headaches that can be relieved by medicines approved by your health care provider. °You may urinate more often. Painful urination may mean you have a bladder infection. °You may develop heartburn as a result of your pregnancy. °You may develop constipation because certain hormones are causing the muscles that push waste through your intestines to slow down. °You may develop hemorrhoids or swollen, bulging veins (varicose veins). °Your breasts may begin to grow larger and become tender. Your nipples may stick out more, and the tissue that  surrounds them (areola) may become darker. °Your gums may bleed and may be sensitive to brushing and flossing. °Dark spots or blotches (chloasma, mask of pregnancy) may develop on your face. This will likely fade after the baby is born. °Your menstrual periods will stop. °You may have a loss of appetite. °You may develop cravings for certain kinds of food. °You may have changes in your emotions from day to day, such as being excited to be pregnant or being concerned that something may go wrong with the pregnancy and baby. °You may have more vivid and strange dreams. °You may have changes in your hair. These can include thickening of your hair, rapid growth, and changes in texture. Some women also have hair loss during or after pregnancy, or hair that feels dry or thin. Your hair will most likely return to normal after your baby is born. °WHAT TO EXPECT AT YOUR PRENATAL   VISITS °During a routine prenatal visit: °You will be weighed to make sure you and the baby are growing normally. °Your blood pressure will be taken. °Your abdomen will be measured to track your baby's growth. °The fetal heartbeat will be listened to starting around week 10 or 12 of your pregnancy. °Test results from any previous visits will be discussed. °Your health care provider may ask you: °How you are feeling. °If you are feeling the baby move. °If you have had any abnormal symptoms, such as leaking fluid, bleeding, severe headaches, or abdominal cramping. °If you have any questions. °Other tests that may be performed during your first trimester include: °Blood tests to find your blood type and to check for the presence of any previous infections. They will also be used to check for low iron levels (anemia) and Rh antibodies. Later in the pregnancy, blood tests for diabetes will be done along with other tests if problems develop. °Urine tests to check for infections, diabetes, or protein in the urine. °An ultrasound to confirm the proper growth  and development of the baby. °An amniocentesis to check for possible genetic problems. °Fetal screens for spina bifida and Down syndrome. °You may need other tests to make sure you and the baby are doing well. °HOME CARE INSTRUCTIONS  °Medicines °Follow your health care provider's instructions regarding medicine use. Specific medicines may be either safe or unsafe to take during pregnancy. °Take your prenatal vitamins as directed. °If you develop constipation, try taking a stool softener if your health care provider approves. °Diet °Eat regular, well-balanced meals. Choose a variety of foods, such as meat or vegetable-based protein, fish, milk and low-fat dairy products, vegetables, fruits, and whole grain breads and cereals. Your health care provider will help you determine the amount of weight gain that is right for you. °Avoid raw meat and uncooked cheese. These carry germs that can cause birth defects in the baby. °Eating four or five small meals rather than three large meals a day may help relieve nausea and vomiting. If you start to feel nauseous, eating a few soda crackers can be helpful. Drinking liquids between meals instead of during meals also seems to help nausea and vomiting. °If you develop constipation, eat more high-fiber foods, such as fresh vegetables or fruit and whole grains. Drink enough fluids to keep your urine clear or pale yellow. °Activity and Exercise °Exercise only as directed by your health care provider. Exercising will help you: °Control your weight. °Stay in shape. °Be prepared for labor and delivery. °Experiencing pain or cramping in the lower abdomen or low back is a good sign that you should stop exercising. Check with your health care provider before continuing normal exercises. °Try to avoid standing for long periods of time. Move your legs often if you must stand in one place for a long time. °Avoid heavy lifting. °Wear low-heeled shoes, and practice good posture. °You may  continue to have sex unless your health care provider directs you otherwise. °Relief of Pain or Discomfort °Wear a good support bra for breast tenderness.   °Take warm sitz baths to soothe any pain or discomfort caused by hemorrhoids. Use hemorrhoid cream if your health care provider approves.   °Rest with your legs elevated if you have leg cramps or low back pain. °If you develop varicose veins in your legs, wear support hose. Elevate your feet for 15 minutes, 3-4 times a day. Limit salt in your diet. °Prenatal Care °Schedule your prenatal visits by the   twelfth week of pregnancy. They are usually scheduled monthly at first, then more often in the last 2 months before delivery. °Write down your questions. Take them to your prenatal visits. °Keep all your prenatal visits as directed by your health care provider. °Safety °Wear your seat belt at all times when driving. °Make a list of emergency phone numbers, including numbers for family, friends, the hospital, and police and fire departments. °General Tips °Ask your health care provider for a referral to a local prenatal education class. Begin classes no later than at the beginning of month 6 of your pregnancy. °Ask for help if you have counseling or nutritional needs during pregnancy. Your health care provider can offer advice or refer you to specialists for help with various needs. °Do not use hot tubs, steam rooms, or saunas. °Do not douche or use tampons or scented sanitary pads. °Do not cross your legs for long periods of time. °Avoid cat litter boxes and soil used by cats. These carry germs that can cause birth defects in the baby and possibly loss of the fetus by miscarriage or stillbirth. °Avoid all smoking, herbs, alcohol, and medicines not prescribed by your health care provider. Chemicals in these affect the formation and growth of the baby. °Schedule a dentist appointment. At home, brush your teeth with a soft toothbrush and be gentle when you floss. °SEEK  MEDICAL CARE IF:  °You have dizziness. °You have mild pelvic cramps, pelvic pressure, or nagging pain in the abdominal area. °You have persistent nausea, vomiting, or diarrhea. °You have a bad smelling vaginal discharge. °You have pain with urination. °You notice increased swelling in your face, hands, legs, or ankles. °SEEK IMMEDIATE MEDICAL CARE IF:  °You have a fever. °You are leaking fluid from your vagina. °You have spotting or bleeding from your vagina. °You have severe abdominal cramping or pain. °You have rapid weight gain or loss. °You vomit blood or material that looks like coffee grounds. °You are exposed to German measles and have never had them. °You are exposed to fifth disease or chickenpox. °You develop a severe headache. °You have shortness of breath. °You have any kind of trauma, such as from a fall or a car accident. °Document Released: 11/10/2001 Document Revised: 04/02/2014 Document Reviewed: 09/26/2013 °ExitCare® Patient Information ©2015 [employer]. This information is not intended to replace advice given to you by your health care provider. Make sure you discuss any questions you have with your health care provider.  °

## 2021-11-25 NOTE — Progress Notes (Signed)
[place] 13 wks,measurements c/w dates,CRL 68.24 mm,normal ovaries,fhr 141 bpm,NB present,NT 1.5 mm,posterior placenta

## 2021-11-27 LAB — PMP SCREEN PROFILE (10S), URINE
Amphetamine Scrn, Ur: NEGATIVE ng/mL
BARBITURATE SCREEN URINE: NEGATIVE ng/mL
BENZODIAZEPINE SCREEN, URINE: NEGATIVE ng/mL
CANNABINOIDS UR QL SCN: NEGATIVE ng/mL
Cocaine (Metab) Scrn, Ur: NEGATIVE ng/mL
Creatinine(Crt), U: 266.1 mg/dL (ref 20.0–300.0)
Methadone Screen, Urine: NEGATIVE ng/mL
OXYCODONE+OXYMORPHONE UR QL SCN: NEGATIVE ng/mL
Opiate Scrn, Ur: NEGATIVE ng/mL
Ph of Urine: 5.6 (ref 4.5–8.9)
Phencyclidine Qn, Ur: NEGATIVE ng/mL
Propoxyphene Scrn, Ur: NEGATIVE ng/mL

## 2021-11-27 LAB — URINE CULTURE

## 2021-11-27 LAB — GC/CHLAMYDIA PROBE AMP
Chlamydia trachomatis, NAA: NEGATIVE
Neisseria Gonorrhoeae by PCR: NEGATIVE

## 2021-11-30 NOTE — L&D Delivery Note (Signed)
[facility] Delivery Note  Patricia Horton is a 26 y.o. G3P0020 s/p SVD at [redacted]w[redacted]d. She was admitted for PROM.   ROM: 20h 61m with clear fluid GBS Status: Positive/-- (06/07 1400) Maximum Maternal Temperature: 100.3  Labor Progress: Initial SVE: 1/90/-2. She then progressed to complete.   Delivery Date/Time:  Delivery: Called to room and patient was complete and pushing. Head delivered ROA. Shoulders were tight coming through the introitus - not a true shoulder dystocia as the anterior shoulder was past the pubic bone. Resolved with McRoberts. No nuchal cord present. Shoulder and body delivered in usual fashion. Infant with spontaneous cry, placed on mother's abdomen, dried and stimulated. Cord clamped x 2 after 1-minute delay, and cut by FOB. Cord blood drawn. Placenta delivered spontaneously with gentle cord traction. Fundus firm with massage and Pitocin. Labia, perineum, vagina, and cervix inspected inspected with no laceratoins.  Baby Weight: pending  Placenta: Sent to L&D Complications: None Lacerations: none EBL: 127 mL Analgesia: Epidural   Infant:  APGAR (1 MIN):  8 APGAR (5 MINS):  9 APGAR (10 MINS):     Levie Heritage, DO [facility] 06/02/2022, 4:12 PM

## 2021-12-02 ENCOUNTER — Encounter: Payer: Self-pay | Admitting: Women's Health

## 2021-12-09 ENCOUNTER — Encounter: Payer: Self-pay | Admitting: Women's Health

## 2021-12-09 DIAGNOSIS — D563 Thalassemia minor: Secondary | ICD-10-CM | POA: Insufficient documentation

## 2021-12-16 ENCOUNTER — Encounter: Payer: Medicaid Other | Admitting: Women's Health

## 2021-12-17 ENCOUNTER — Other Ambulatory Visit: Payer: Self-pay

## 2021-12-17 ENCOUNTER — Emergency Department (HOSPITAL_COMMUNITY)
Admission: EM | Admit: 2021-12-17 | Discharge: 2021-12-17 | Disposition: A | Discharge status: Home / Self Care | Payer: Medicaid Other | Attending: Emergency Medicine | Admitting: Emergency Medicine

## 2021-12-17 ENCOUNTER — Encounter (HOSPITAL_COMMUNITY): Payer: Self-pay

## 2021-12-17 DIAGNOSIS — Z3A17 17 weeks gestation of pregnancy: Secondary | ICD-10-CM | POA: Insufficient documentation

## 2021-12-17 DIAGNOSIS — U071 COVID-19: Secondary | ICD-10-CM | POA: Diagnosis not present

## 2021-12-17 DIAGNOSIS — Z7982 Long term (current) use of aspirin: Secondary | ICD-10-CM | POA: Insufficient documentation

## 2021-12-17 DIAGNOSIS — O98512 Other viral diseases complicating pregnancy, second trimester: Secondary | ICD-10-CM | POA: Diagnosis not present

## 2021-12-17 DIAGNOSIS — O26891 Other specified pregnancy related conditions, first trimester: Secondary | ICD-10-CM | POA: Diagnosis not present

## 2021-12-17 LAB — RESP PANEL BY RT-PCR (FLU A&B, COVID) ARPGX2
Influenza A by PCR: NEGATIVE
Influenza B by PCR: NEGATIVE
SARS Coronavirus 2 by RT PCR: POSITIVE — AB

## 2021-12-17 NOTE — ED Notes (Signed)
Fetal doppler showed fetal heart rate approximately 178 bpm.

## 2021-12-17 NOTE — ED Provider Notes (Signed)
[facility] Provider Note   CSN: [id number] Arrival date & time: 12/17/21  1832     History  Chief Complaint  Patient presents with   Generalized Body Aches    Patricia Horton is a 26 y.o. female.  Patient presents to the ED with generalized body aches, including her low back and abdomen. Patient is [redacted] weeks pregnant, G3P0. This is the first pregnancy that she has carried this far. Patient with nasal congestion and sore throat that started 2-3 days ago. Intermittent low grade fever and chills. No nausea or vomiting. No vaginal discharge or bleeding. She is scheduled to follow-up with OB tomorrow.  The history is provided by the patient.  URI Presenting symptoms: congestion, fever and sore throat   Congestion:    Location:  Nasal Severity:  Mild Duration:  2 days Timing:  Intermittent Progression:  Partially resolved Chronicity:  New     Home Medications Prior to Admission medications   Medication Sig Start Date End Date Taking? Authorizing Provider  aspirin 81 MG EC tablet Take 1 tablet (81 mg total) by mouth daily. Swallow whole. 11/25/21   Roma Schanz, CNM  cyclobenzaprine (FLEXERIL) 10 MG tablet Take 1 tablet (10 mg total) by mouth every 8 (eight) hours as needed for muscle spasms. 11/21/21   Florian Buff, MD  Doxylamine-Pyridoxine (DICLEGIS) 10-10 MG TBEC 2 tabs q hs, if sx persist add 1 tab q am on day 3, if sx persist add 1 tab q afternoon on day 4 09/29/21   Roma Schanz, CNM  glycopyrrolate (ROBINUL) 1 MG tablet Take 1 tablet (1 mg total) by mouth 3 (three) times daily. 10/15/21   Myrtis Ser, CNM  ondansetron (ZOFRAN ODT) 4 MG disintegrating tablet Take 1 tablet (4 mg total) by mouth every 6 (six) hours as needed for nausea or vomiting. 10/22/21   Darlina Rumpf, CNM  ondansetron (ZOFRAN) 4 MG tablet Take 1 tablet (4 mg total) by mouth every 6 (six) hours as needed for nausea or vomiting. Patient not taking: Reported on  11/21/2021 10/07/21   Orpah Greek, MD  predniSONE (DELTASONE) 10 MG tablet Take 4 tablets all at once daily for 10 days 11/21/21   Florian Buff, MD  Prenatal MV & Min w/FA-DHA (PRENATAL ADULT GUMMY/DHA/FA PO) Take by mouth. Takes 2 daily    [provider]  progesterone (PROMETRIUM) 200 MG capsule Place 1 capsule (200 mg total) vaginally at bedtime. 09/30/21   Roma Schanz, CNM  promethazine (PHENERGAN) 25 MG tablet Take 1 tablet (25 mg total) by mouth every 6 (six) hours as needed for nausea or vomiting. 10/22/21   Darlina Rumpf, CNM      Allergies    Patient has no known allergies.    Review of Systems   Review of Systems  Constitutional:  Positive for fever.  HENT:  Positive for congestion, sinus pressure and sore throat.   All other systems reviewed and are negative.  Physical Exam Updated Vital Signs BP 126/73 (BP Location: Right Arm)    Pulse (!) 105    Temp 99.1 F (37.3 C) (Oral)    Resp 16    Ht 5\' 4"  (1.626 m)    Wt 86.2 kg    LMP 07/30/2021    SpO2 100%    BMI 32.61 kg/m  Physical Exam Constitutional:      Appearance: Normal appearance.  HENT:     Head: Normocephalic.  Nose: Congestion present.     Mouth/Throat:     Mouth: Mucous membranes are moist.     Pharynx: No oropharyngeal exudate.  Eyes:     Conjunctiva/sclera: Conjunctivae normal.  Cardiovascular:     Rate and Rhythm: Normal rate.  Pulmonary:     Effort: Pulmonary effort is normal.  Abdominal:     Palpations: Abdomen is soft.  Musculoskeletal:        General: Normal range of motion.     Cervical back: Normal range of motion.  Lymphadenopathy:     Cervical: No cervical adenopathy.  Skin:    General: Skin is warm and dry.  Neurological:     Mental Status: She is alert and oriented to person, place, and time.  Psychiatric:        Mood and Affect: Mood normal.        Behavior: Behavior normal.    ED Results / Procedures / Treatments   Labs (all labs ordered are  listed, but only abnormal results are displayed) Labs Reviewed  RESP PANEL BY RT-PCR (FLU A&B, COVID) [id number]    EKG None  Radiology No results found.  Procedures Procedures    Medications Ordered in ED Medications - No data to display  ED Course/ Medical Decision Making/ A&P                              Medical Decision Making   Labs reviewed. Patient is COVID positive.  Mild tachycardia. Likely due to low grade fever and pregnancy.  No vaginal bleeding or discharge. Muscular abdominal discomfort and back pain.  No cough, chest pain, shortness of breath, N/V/D.  Fetal heart tones normal.  Patricia Horton was evaluated in Emergency Department on 12/17/2021 for the symptoms described in the history of present illness. She was evaluated in the context of the global COVID-19 pandemic, which necessitated consideration that the patient might be at risk for infection with the SARS-CoV-2 virus that causes COVID-19. Institutional protocols and algorithms that pertain to the evaluation of patients at risk for COVID-19 are in a state of rapid change based on information released by regulatory bodies including the CDC and federal and state organizations. These policies and algorithms were followed during the patient's care in the ED.   Patient evaluated, tested and sent home with instructions for home care and Quarantine.   Tylenol for fever/discomfort.  Instructed to seek further care if symptoms worsen. Follow-up with OB tomorrow as scheduled.          Final Clinical Impression(s) / ED Diagnoses Final diagnoses:  [id number]    Rx / DC Orders ED Discharge Orders     None         Etta Quill, NP 12/17/21 2238    Lajean Saver, MD 12/17/21 2258

## 2021-12-17 NOTE — ED Triage Notes (Signed)
Pt presents to ED with complaints of generalized body aches, stating worse in her abdomen. Pt states she started sore throat, congestion a couple of days ago. Pt is currently [redacted] weeks pregnant.

## 2021-12-17 NOTE — Discharge Instructions (Signed)
Your baby's fetal heart tones are normal. You have tested positive for COVID. Please follow-up with your OB tomorrow as scheduled. Contact the office before your appointment for any special precautions. Tylenol for fever or discomfort.

## 2021-12-18 ENCOUNTER — Encounter: Payer: Medicaid Other | Admitting: Women's Health

## 2021-12-18 ENCOUNTER — Telehealth: Payer: Self-pay | Admitting: *Deleted

## 2021-12-18 ENCOUNTER — Encounter: Payer: Self-pay | Admitting: *Deleted

## 2021-12-18 NOTE — Telephone Encounter (Signed)
Pt went to [facility] ER yesterday with pain in top of stomach and pelvis. Pt stated the hospital "didn't do anything". Was diagnosed with Covid yesterday. Pt feels like she is drinking plenty of fluids. No bleeding. Pt states the pain that she felt was like when she had a D&C. I spoke with Maudie Mercury B. Pt was advised to continue drinking plenty of water. Can take Tylenol for headache, fever and body aches. Take Mucinex for congestion and run a cool mist humidifier in the room that she's in. Pt had an appt today but is covid + and she don't have a BP cuff at home. If she starts having bleeding or leaking fluid, go to [facility], don't go to [facility]. Pt voiced understanding. The front will be getting in touch with pt regarding rescheduling her appt. [place]

## 2021-12-19 ENCOUNTER — Encounter: Payer: Self-pay | Admitting: Women's Health

## 2021-12-24 ENCOUNTER — Ambulatory Visit (INDEPENDENT_AMBULATORY_CARE_PROVIDER_SITE_OTHER): Payer: Medicaid Other | Admitting: Advanced Practice Midwife

## 2021-12-24 ENCOUNTER — Encounter: Payer: Self-pay | Admitting: Advanced Practice Midwife

## 2021-12-24 ENCOUNTER — Other Ambulatory Visit: Payer: Self-pay

## 2021-12-24 VITALS — BP 116/67 | HR 73 | Wt 190.0 lb

## 2021-12-24 DIAGNOSIS — Z3480 Encounter for supervision of other normal pregnancy, unspecified trimester: Secondary | ICD-10-CM

## 2021-12-24 DIAGNOSIS — Z3A17 17 weeks gestation of pregnancy: Secondary | ICD-10-CM | POA: Diagnosis not present

## 2021-12-24 DIAGNOSIS — O210 Mild hyperemesis gravidarum: Secondary | ICD-10-CM

## 2021-12-24 DIAGNOSIS — Z363 Encounter for antenatal screening for malformations: Secondary | ICD-10-CM

## 2021-12-24 NOTE — Progress Notes (Signed)
° °  LOW-RISK PREGNANCY VISIT Patient name: Patricia Horton MRN [id number]  Date of birth: [birth year] Chief Complaint:   Routine Prenatal Visit  History of Present Illness:   Patricia Horton is a 26 y.o. G3P0020 female at [redacted]w[redacted]d with an Estimated Date of Delivery: 06/02/22 being seen today for ongoing management of a low-risk pregnancy.  Today she reports  only vomiting intermittently; feeling much better now . Contractions: Not present. Vag. Bleeding: None.  Movement: Present. denies leaking of fluid. Review of Systems:   Pertinent items are noted in HPI Denies abnormal vaginal discharge w/ itching/odor/irritation, headaches, visual changes, shortness of breath, chest pain, abdominal pain, severe nausea/vomiting, or problems with urination or bowel movements unless otherwise stated above. Pertinent History Reviewed:  Reviewed past medical,surgical, social, obstetrical and family history.  Reviewed problem list, medications and allergies. Physical Assessment:   Vitals:   12/24/21 0923  BP: 116/67  Pulse: 73  Weight: 190 lb (86.2 kg)  Body mass index is 32.61 kg/m.        Physical Examination:   General appearance: Well appearing, and in no distress  Mental status: Alert, oriented to person, place, and time  Skin: Warm & dry  Cardiovascular: Normal heart rate noted  Respiratory: Normal respiratory effort, no distress  Abdomen: Soft, gravid, nontender  Pelvic: Cervical exam deferred         Extremities: Edema: None  Fetal Status: Fetal Heart Rate (bpm): 155   Movement: Present    No results found for this or any previous visit (from the past 24 hour(s)).  Assessment & Plan:  1) Low-risk pregnancy G3P0020 at [redacted]w[redacted]d with an Estimated Date of Delivery: 06/02/22   2) Rh neg, reviewed that she will need Rhogam ~28wks   Meds: No orders of the defined types were placed in this encounter.  Labs/procedures today: PN1, AFP   Plan:  Continue routine obstetrical care with anatomy @ next  visit  Reviewed: Preterm labor symptoms and general obstetric precautions including but not limited to vaginal bleeding, contractions, leaking of fluid and fetal movement were reviewed in detail with the patient.  All questions were answered. Didn't ask about home bp cuff. Check bp weekly, let us know if >140/90.   Follow-up: Return in about 3 weeks (around 01/14/2022) for As scheduled.  Orders Placed This Encounter  Procedures   US OB Comp + 14 Wk   CBC/D/Plt+RPR+Rh+ABO+RubIgG...   AFP, Serum, Open Spina Bifida   Arabella Merles [facility] 12/24/2021 9:57 AM

## 2021-12-24 NOTE — Patient Instructions (Signed)
Deseri, thank you for choosing our office today! We appreciate the opportunity to meet your healthcare needs. You may receive a short survey by mail, e-mail, or through MyChart. If you are happy with your care we would appreciate if you could take just a few minutes to complete the survey questions. We read all of your comments and take your feedback very seriously. Thank you again for choosing our office.  [facility] Team at [facility] ([address] [place], [place] [address]) Entrance C, located off of [address] Free 24/7 valet parking  Go to [email or web address] to register for FREE online childbirth classes  Call the office [phone]) or go to [facility] if: You begin to severe cramping Your water breaks.  Sometimes it is a big gush of fluid, sometimes it is just a trickle that keeps getting your panties wet or running down your legs You have vaginal bleeding.  It is normal to have a small amount of spotting if your cervix was checked.   [place] Pediatricians/Family Doctors [facility] ([facility]): [address], [phone]           [facility]: [address], [phone]                [facility] ([facility]): [address], [phone] (call to ask if accepting patients) [facility]: [address], [place], [phone]    [place] Pediatricians/Family Doctors [facility] ([facility]): [address], [phone] [facility]: [address], [phone] [facility]: [address], [phone]  [place] [facility]  [facility] ([facility]): [phone] [facility]: [address], [phone]   [place] [facility]: [address], [phone]  [place] [facility]  [facility]: [address], [phone]  Home Blood Pressure Monitoring for Patients   Your provider has recommended that you check your blood pressure (BP) at least once a week at home. If you do not have a blood pressure cuff at home, one will be provided for you. Contact your provider if you have not received your monitor within 1 week.   Helpful Tips for Accurate Home Blood Pressure Checks  Don't smoke, exercise, or drink caffeine 30 minutes before checking your BP Use the restroom before checking your BP (a full bladder can raise your pressure) Relax in a comfortable upright chair Feet on the ground Left arm resting comfortably on a flat surface at the level of your heart Legs uncrossed Back supported Sit quietly and don't talk Place the cuff on your bare arm Adjust snuggly, so that only two fingertips can fit between your skin and the top of the cuff Check 2 readings separated by at least one minute Keep a log of your BP readings For a visual, please reference this diagram: http://ccnc.care/bpdiagram  Provider Name: [facility]     Phone: [phone]  Zone 1: ALL CLEAR  Continue to monitor your symptoms:  BP reading is less than 140 (top number) or less than 90 (bottom number)  No right upper stomach pain No headaches or seeing spots No feeling nauseated or throwing up No swelling in face and hands  Zone 2: CAUTION Call your doctor's office for any of the following:  BP reading is greater than 140 (top number) or greater than   90 (bottom number)  Stomach pain under your ribs in the middle or right side Headaches or seeing spots Feeling nauseated or throwing up Swelling in face and hands  Zone 3: EMERGENCY  Seek immediate medical care if you have any of the following:  BP reading is greater than160 (top number) or greater than 110 (bottom number) Severe headaches not improving with Tylenol Serious difficulty catching your breath Any worsening symptoms from  Zone 2     Second Trimester of Pregnancy The second trimester is from week 14 through week 27 (months 4 through 6). The second trimester is often a time when you feel your best. Your body has adjusted to being pregnant, and you begin to feel better physically. Usually, morning sickness has lessened or quit completely, you may have more energy, and you may have an increase in appetite. The second trimester is also a time when the fetus is growing rapidly. At the end of the sixth month, the fetus is about 9 inches long and weighs about 1 pounds. You will likely begin to feel the baby move (quickening) between 16 and 20 weeks of pregnancy. Body changes during your second trimester Your body continues to go through many changes during your second trimester. The changes vary from woman to woman. Your weight will continue to increase. You will notice your lower abdomen bulging out. You may begin to get stretch marks on your hips, abdomen, and breasts. You may develop headaches that can be relieved by medicines. The medicines should be approved by your health care provider. You may urinate more often because the fetus is pressing on your bladder. You may develop or continue to have heartburn as a result of your pregnancy. You may develop constipation because certain hormones are causing the muscles that push waste through your intestines to slow down. You may develop hemorrhoids or swollen, bulging veins (varicose veins). You may have back pain. This is caused by: Weight gain. Pregnancy hormones that are relaxing the joints in your pelvis. A shift in weight and the muscles that support your balance. Your breasts will continue to grow and they will continue to become tender. Your gums may bleed and may be sensitive to brushing and flossing. Dark spots or blotches (chloasma, mask of pregnancy) may develop on your face. This will likely fade after the baby is born. A dark line from your belly button to  the pubic area (linea nigra) may appear. This will likely fade after the baby is born. You may have changes in your hair. These can include thickening of your hair, rapid growth, and changes in texture. Some women also have hair loss during or after pregnancy, or hair that feels dry or thin. Your hair will most likely return to normal after your baby is born.  What to expect at prenatal visits During a routine prenatal visit: You will be weighed to make sure you and the fetus are growing normally. Your blood pressure will be taken. Your abdomen will be measured to track your baby's growth. The fetal heartbeat will be listened to. Any test results from the previous visit will be discussed.  Your health care provider may ask you: How you are feeling. If you are feeling the baby move. If you have had any abnormal symptoms, such as leaking fluid, bleeding, severe headaches, or abdominal cramping. If you are using any tobacco products, including cigarettes, chewing tobacco, and electronic cigarettes. If you have any questions.  Other tests that may be performed during   your second trimester include: Blood tests that check for: Low iron levels (anemia). High blood sugar that affects pregnant women (gestational diabetes) between 24 and 28 weeks. Rh antibodies. This is to check for a protein on red blood cells (Rh factor). Urine tests to check for infections, diabetes, or protein in the urine. An ultrasound to confirm the proper growth and development of the baby. An amniocentesis to check for possible genetic problems. Fetal screens for spina bifida and Down syndrome. HIV (human immunodeficiency virus) testing. Routine prenatal testing includes screening for HIV, unless you choose not to have this test.  Follow these instructions at home: Medicines Follow your health care provider's instructions regarding medicine use. Specific medicines may be either safe or unsafe to take during  pregnancy. Take a prenatal vitamin that contains at least 600 micrograms (mcg) of folic acid. If you develop constipation, try taking a stool softener if your health care provider approves. Eating and drinking Eat a balanced diet that includes fresh fruits and vegetables, whole grains, good sources of protein such as meat, eggs, or tofu, and low-fat dairy. Your health care provider will help you determine the amount of weight gain that is right for you. Avoid raw meat and uncooked cheese. These carry germs that can cause birth defects in the baby. If you have low calcium intake from food, talk to your health care provider about whether you should take a daily calcium supplement. Limit foods that are high in fat and processed sugars, such as fried and sweet foods. To prevent constipation: Drink enough fluid to keep your urine clear or pale yellow. Eat foods that are high in fiber, such as fresh fruits and vegetables, whole grains, and beans. Activity Exercise only as directed by your health care provider. Most women can continue their usual exercise routine during pregnancy. Try to exercise for 30 minutes at least 5 days a week. Stop exercising if you experience uterine contractions. Avoid heavy lifting, wear low heel shoes, and practice good posture. A sexual relationship may be continued unless your health care provider directs you otherwise. Relieving pain and discomfort Wear a good support bra to prevent discomfort from breast tenderness. Take warm sitz baths to soothe any pain or discomfort caused by hemorrhoids. Use hemorrhoid cream if your health care provider approves. Rest with your legs elevated if you have leg cramps or low back pain. If you develop varicose veins, wear support hose. Elevate your feet for 15 minutes, 3-4 times a day. Limit salt in your diet. Prenatal Care Write down your questions. Take them to your prenatal visits. Keep all your prenatal visits as told by your health  care provider. This is important. Safety Wear your seat belt at all times when driving. Make a list of emergency phone numbers, including numbers for family, friends, the hospital, and police and fire departments. General instructions Ask your health care provider for a referral to a local prenatal education class. Begin classes no later than the beginning of month 6 of your pregnancy. Ask for help if you have counseling or nutritional needs during pregnancy. Your health care provider can offer advice or refer you to specialists for help with various needs. Do not use hot tubs, steam rooms, or saunas. Do not douche or use tampons or scented sanitary pads. Do not cross your legs for long periods of time. Avoid cat litter boxes and soil used by cats. These carry germs that can cause birth defects in the baby and possibly loss of the   fetus by miscarriage or stillbirth. Avoid all smoking, herbs, alcohol, and unprescribed drugs. Chemicals in these products can affect the formation and growth of the baby. Do not use any products that contain nicotine or tobacco, such as cigarettes and e-cigarettes. If you need help quitting, ask your health care provider. Visit your dentist if you have not gone yet during your pregnancy. Use a soft toothbrush to brush your teeth and be gentle when you floss. Contact a health care provider if: You have dizziness. You have mild pelvic cramps, pelvic pressure, or nagging pain in the abdominal area. You have persistent nausea, vomiting, or diarrhea. You have a bad smelling vaginal discharge. You have pain when you urinate. Get help right away if: You have a fever. You are leaking fluid from your vagina. You have spotting or bleeding from your vagina. You have severe abdominal cramping or pain. You have rapid weight gain or weight loss. You have shortness of breath with chest pain. You notice sudden or extreme swelling of your face, hands, ankles, feet, or legs. You  have not felt your baby move in over an hour. You have severe headaches that do not go away when you take medicine. You have vision changes. Summary The second trimester is from week 14 through week 27 (months 4 through 6). It is also a time when the fetus is growing rapidly. Your body goes through many changes during pregnancy. The changes vary from woman to woman. Avoid all smoking, herbs, alcohol, and unprescribed drugs. These chemicals affect the formation and growth your baby. Do not use any tobacco products, such as cigarettes, chewing tobacco, and e-cigarettes. If you need help quitting, ask your health care provider. Contact your health care provider if you have any questions. Keep all prenatal visits as told by your health care provider. This is important. This information is not intended to replace advice given to you by your health care provider. Make sure you discuss any questions you have with your health care provider. Document Released: 11/10/2001 Document Revised: 04/23/2016 Document Reviewed: 01/17/2013 Elsevier Interactive Patient Education  2017 Elsevier Inc.  

## 2021-12-26 LAB — CBC/D/PLT+RPR+RH+ABO+RUBIGG...
Antibody Screen: NEGATIVE
Basophils Absolute: 0 10*3/uL (ref 0.0–0.2)
Basos: 0 %
EOS (ABSOLUTE): 0.1 10*3/uL (ref 0.0–0.4)
Eos: 1 %
HCV Ab: <0.1 s/co ratio (ref 0.0–0.9)
HIV Screen 4th Generation wRfx: NONREACTIVE
Hematocrit: 38.7 % (ref 34.0–46.6)
Hemoglobin: 12.5 g/dL (ref 11.1–15.9)
Hepatitis B Surface Ag: NEGATIVE
Immature Grans (Abs): 0.1 10*3/uL (ref 0.0–0.1)
Immature Granulocytes: 1 %
Lymphocytes Absolute: 2.1 10*3/uL (ref 0.7–3.1)
Lymphs: 29 %
MCH: 27.3 pg (ref 26.6–33.0)
MCHC: 32.3 g/dL (ref 31.5–35.7)
MCV: 85 fL (ref 79–97)
Monocytes Absolute: 0.4 10*3/uL (ref 0.1–0.9)
Monocytes: 5 %
Neutrophils Absolute: 4.6 10*3/uL (ref 1.4–7.0)
Neutrophils: 64 %
Platelets: 360 10*3/uL (ref 150–450)
RBC: 4.58 x10E6/uL (ref 3.77–5.28)
RDW: 15.2 % (ref 11.7–15.4)
RPR Ser Ql: NONREACTIVE
Rh Factor: NEGATIVE
Rubella Antibodies, IGG: 3.3 index (ref >0.99)
WBC: 7.1 10*3/uL (ref 3.4–10.8)

## 2021-12-26 LAB — AFP, SERUM, OPEN SPINA BIFIDA
AFP MoM: 1.01
AFP Value: 38.8 ng/mL
Gest. Age on Collection Date: 17.1 weeks
Maternal Age At EDD: 27.3 yr
OSBR Risk 1 IN: 10000
Test Results:: NEGATIVE
Weight: 190 [lb_av]

## 2021-12-26 LAB — HCV INTERPRETATION

## 2022-01-14 ENCOUNTER — Inpatient Hospital Stay (HOSPITAL_COMMUNITY)
Admission: AD | Admit: 2022-01-14 | Discharge: 2022-01-14 | Disposition: A | Discharge status: Home / Self Care | Payer: Medicaid Other | Attending: Obstetrics & Gynecology | Admitting: Obstetrics & Gynecology

## 2022-01-14 ENCOUNTER — Encounter (HOSPITAL_COMMUNITY): Payer: Self-pay | Admitting: Obstetrics & Gynecology

## 2022-01-14 ENCOUNTER — Inpatient Hospital Stay (HOSPITAL_COMMUNITY): Payer: Medicaid Other

## 2022-01-14 DIAGNOSIS — Z79899 Other long term (current) drug therapy: Secondary | ICD-10-CM | POA: Diagnosis not present

## 2022-01-14 DIAGNOSIS — O26899 Other specified pregnancy related conditions, unspecified trimester: Secondary | ICD-10-CM | POA: Diagnosis not present

## 2022-01-14 DIAGNOSIS — R109 Unspecified abdominal pain: Secondary | ICD-10-CM

## 2022-01-14 DIAGNOSIS — R1031 Right lower quadrant pain: Secondary | ICD-10-CM | POA: Diagnosis present

## 2022-01-14 DIAGNOSIS — O26892 Other specified pregnancy related conditions, second trimester: Secondary | ICD-10-CM | POA: Diagnosis not present

## 2022-01-14 DIAGNOSIS — Z3A20 20 weeks gestation of pregnancy: Secondary | ICD-10-CM | POA: Diagnosis not present

## 2022-01-14 DIAGNOSIS — M7918 Myalgia, other site: Secondary | ICD-10-CM | POA: Insufficient documentation

## 2022-01-14 DIAGNOSIS — Z7982 Long term (current) use of aspirin: Secondary | ICD-10-CM | POA: Diagnosis not present

## 2022-01-14 HISTORY — DX: Unspecified asthma, uncomplicated: J45.909

## 2022-01-14 LAB — COMPREHENSIVE METABOLIC PANEL
ALT: 17 U/L (ref 0–44)
AST: 17 U/L (ref 15–41)
Albumin: 3 g/dL — ABNORMAL LOW (ref 3.5–5.0)
Alkaline Phosphatase: 38 U/L (ref 38–126)
Anion gap: 8 (ref 5–15)
BUN: 5 mg/dL — ABNORMAL LOW (ref 6–20)
CO2: 19 mmol/L — ABNORMAL LOW (ref 22–32)
Calcium: 9 mg/dL (ref 8.9–10.3)
Chloride: 107 mmol/L (ref 98–111)
Creatinine, Ser: 0.65 mg/dL (ref 0.44–1.00)
GFR, Estimated: >60 mL/min (ref >60)
Glucose, Bld: 90 mg/dL (ref 70–99)
Potassium: 3.7 mmol/L (ref 3.5–5.1)
Sodium: 134 mmol/L — ABNORMAL LOW (ref 135–145)
Total Bilirubin: 0.2 mg/dL — ABNORMAL LOW (ref 0.3–1.2)
Total Protein: 6.2 g/dL — ABNORMAL LOW (ref 6.5–8.1)

## 2022-01-14 LAB — CBC WITH DIFFERENTIAL/PLATELET
Abs Immature Granulocytes: 0.07 10*3/uL (ref 0.00–0.07)
Basophils Absolute: 0 10*3/uL (ref 0.0–0.1)
Basophils Relative: 0 %
Eosinophils Absolute: 0.1 10*3/uL (ref 0.0–0.5)
Eosinophils Relative: 1 %
HCT: 33.1 % — ABNORMAL LOW (ref 36.0–46.0)
Hemoglobin: 11.1 g/dL — ABNORMAL LOW (ref 12.0–15.0)
Immature Granulocytes: 1 %
Lymphocytes Relative: 24 %
Lymphs Abs: 2.1 10*3/uL (ref 0.7–4.0)
MCH: 27.7 pg (ref 26.0–34.0)
MCHC: 33.5 g/dL (ref 30.0–36.0)
MCV: 82.5 fL (ref 80.0–100.0)
Monocytes Absolute: 0.5 10*3/uL (ref 0.1–1.0)
Monocytes Relative: 6 %
Neutro Abs: 6.2 10*3/uL (ref 1.7–7.7)
Neutrophils Relative %: 68 %
Platelets: 316 10*3/uL (ref 150–400)
RBC: 4.01 MIL/uL (ref 3.87–5.11)
RDW: 14.3 % (ref 11.5–15.5)
WBC: 9 10*3/uL (ref 4.0–10.5)
nRBC: 0 % (ref 0.0–0.2)

## 2022-01-14 LAB — URINALYSIS, ROUTINE W REFLEX MICROSCOPIC
Bilirubin Urine: NEGATIVE
Glucose, UA: NEGATIVE mg/dL
Hgb urine dipstick: NEGATIVE
Ketones, ur: NEGATIVE mg/dL
Leukocytes,Ua: NEGATIVE
Nitrite: NEGATIVE
Protein, ur: NEGATIVE mg/dL
Specific Gravity, Urine: 1.01 (ref 1.005–1.030)
pH: 8 (ref 5.0–8.0)

## 2022-01-14 MED ORDER — ACETAMINOPHEN 500 MG PO TABS
1000.0000 mg | ORAL_TABLET | Freq: Once | ORAL | Status: AC
  Administered 2022-01-14: 1000 mg via ORAL
  Filled 2022-01-14: qty 2

## 2022-01-14 MED ORDER — HYDROMORPHONE HCL 1 MG/ML IJ SOLN
0.5000 mg | Freq: Once | INTRAMUSCULAR | Status: DC
Start: 2022-01-14 — End: 2022-01-14

## 2022-01-14 MED ORDER — HYDROMORPHONE HCL 1 MG/ML IJ SOLN
0.5000 mg | Freq: Once | INTRAMUSCULAR | Status: AC
  Administered 2022-01-14: 0.5 mg via INTRAVENOUS
  Filled 2022-01-14: qty 1

## 2022-01-14 MED ORDER — IOHEXOL 300 MG/ML  SOLN
100.0000 mL | Freq: Once | INTRAMUSCULAR | Status: AC | PRN
  Administered 2022-01-14: 100 mL via INTRAVENOUS

## 2022-01-14 NOTE — MAU Provider Note (Addendum)
History     CSN: [id number]  Arrival date and time: 01/14/22 0848   Event Date/Time   First Provider Initiated Contact with Patient 01/14/22 [phone]      Chief Complaint  Patient presents with   Abdominal Pain   HPI Patricia Horton is a 26 y.o. G3P0020 at [redacted]w[redacted]d who presents to MAU with chief complaint of RLQ and right mid-abdominal pain. Onset of pain: two days ago. Pain score 10/10. Pain radiates across right lateral mid-abdomen and into right lower back. She also endorses TTP and pressure in rectum. Pain aggravated by touch, movement. Improves significantly with rest.  She has not taken medication or tried other treatments for this complaint. She denies contractions, vaginal bleeding, abnormal vaginal discharge, dysuria, fever, recent illness. She denies recent physical exertion beyond baseline.  Patient receives care with [facility].  OB History     Gravida  3   Para  0   Term  0   Preterm  0   AB  2   Living  0      SAB  2   IAB  0   Ectopic  0   Multiple  0   Live Births  0           Past Medical History:  Diagnosis Date   Asthma    Migraine with aura 2014   NO ESTROGEN   Seizures ([place])    last seizure 2017    Past Surgical History:  Procedure Laterality Date   DILATION AND EVACUATION N/A 10/01/2020   Procedure: DILATATION AND EVACUATION;  Surgeon: Sloan Leiter, MD;  Location: [facility];  Service: Gynecology;  Laterality: N/A;   NO PAST SURGERIES      Family History  Problem Relation Age of Onset   Diabetes Mother    Stroke Mother    Hypertension Mother    Healthy Father    Heart disease Paternal Aunt    Cancer Paternal Grandmother        breast   Heart disease Paternal Grandmother     Social History   Tobacco Use   Smoking status: Never   Smokeless tobacco: Never  Vaping Use   Vaping Use: Never used  Substance Use Topics   Alcohol use: Not Currently    Comment: occ   Drug use: No    Allergies: No  Known Allergies  Medications Prior to Admission  Medication Sig Dispense Refill Last Dose   aspirin 81 MG EC tablet Take 1 tablet (81 mg total) by mouth daily. Swallow whole. 90 tablet 3 01/12/2022   Doxylamine-Pyridoxine (DICLEGIS) 10-10 MG TBEC 2 tabs q hs, if sx persist add 1 tab q am on day 3, if sx persist add 1 tab q afternoon on day 4 100 tablet 6 01/13/2022   ondansetron (ZOFRAN ODT) 4 MG disintegrating tablet Take 1 tablet (4 mg total) by mouth every 6 (six) hours as needed for nausea or vomiting. 30 tablet 3 01/13/2022   Prenatal MV & Min w/FA-DHA (PRENATAL ADULT GUMMY/DHA/FA PO) Take by mouth. Takes 2 daily   01/13/2022   cyclobenzaprine (FLEXERIL) 10 MG tablet Take 1 tablet (10 mg total) by mouth every 8 (eight) hours as needed for muscle spasms. 30 tablet 1    glycopyrrolate (ROBINUL) 1 MG tablet Take 1 tablet (1 mg total) by mouth 3 (three) times daily. (Patient not taking: Reported on 12/24/2021) 90 tablet 1    ondansetron (ZOFRAN) 4 MG tablet Take 1  tablet (4 mg total) by mouth every 6 (six) hours as needed for nausea or vomiting. 15 tablet 0    progesterone (PROMETRIUM) 200 MG capsule Place 1 capsule (200 mg total) vaginally at bedtime. (Patient not taking: Reported on 12/24/2021) 20 capsule 3    promethazine (PHENERGAN) 25 MG tablet Take 1 tablet (25 mg total) by mouth every 6 (six) hours as needed for nausea or vomiting. (Patient not taking: Reported on 12/24/2021) 30 tablet 3     Review of Systems  Gastrointestinal:  Positive for abdominal pain.  All other systems reviewed and are negative. Physical Exam   Blood pressure 119/74, pulse 78, temperature 98.7 F (37.1 C), temperature source Oral, resp. rate 17, height 5\' 3"  (1.6 m), weight 89.6 kg, last menstrual period 07/30/2021, SpO2 98 %.  Physical Exam Vitals and nursing note reviewed. Exam conducted with a chaperone present.  Constitutional:      Appearance: She is well-developed.  Cardiovascular:     Rate and Rhythm:  Normal rate and regular rhythm.     Heart sounds: Normal heart sounds.  Pulmonary:     Effort: Pulmonary effort is normal.     Breath sounds: Normal breath sounds.  Abdominal:     Palpations: Abdomen is soft.     Tenderness: There is abdominal tenderness in the right lower quadrant. There is right CVA tenderness, left CVA tenderness, guarding and rebound.     Comments: Gravid  Skin:    Capillary Refill: Capillary refill takes less than 2 seconds.  Neurological:     Mental Status: She is alert and oriented to person, place, and time.  Psychiatric:        Mood and Affect: Mood normal.        Behavior: Behavior normal.    MAU Course  Procedures  --TTP, RLQ with radiation and right CVAT without urinary complaints. Afebrile and no recent history of fever. Has appendix. Discussed with Dr. Harolyn Rutherford who agrees imaging is merited. Will order CT per discussion with Dr. Harolyn Rutherford, ok to switch to MRI if Radiology prefers  --1015: CNM at bedside, CT consent reviewed and signed with patient. Patient resting in right lateral  --Pertinent negatives: dysuria, elevated WBC, abnormal appendix on imaging  Patient Vitals for the past 24 hrs:  BP Temp Temp src Pulse Resp SpO2 Height Weight  01/14/22 1250 109/68 -- -- 74 -- -- -- --  01/14/22 0913 119/74 -- -- 80 -- -- -- --  01/14/22 0910 119/74 98.7 F (37.1 C) Oral 78 17 98 % -- --  01/14/22 0900 -- -- -- -- -- -- 5\' 3"  (1.6 m) 89.6 kg    Orders Placed This Encounter  Procedures   Urinalysis, Routine w reflex microscopic Urine, Clean Catch   CBC with Differential/Platelet   Comprehensive metabolic panel   Meds ordered this encounter  Medications   acetaminophen (TYLENOL) tablet 1,000 mg   HYDROmorphone (DILAUDID) injection 0.5 mg   iohexol (OMNIPAQUE) 300 MG/ML solution 100 mL    Results for orders placed or performed during the hospital encounter of 01/14/22 (from the past 24 hour(s))  Urinalysis, Routine w reflex microscopic Urine,  Clean Catch     Status: Abnormal   Collection Time: 01/14/22  9:15 AM  Result Value Ref Range   Color, Urine STRAW (A) YELLOW   APPearance CLEAR CLEAR   Specific Gravity, Urine 1.010 1.005 - 1.030   pH 8.0 5.0 - 8.0   Glucose, UA NEGATIVE NEGATIVE mg/dL   Hgb urine  dipstick NEGATIVE NEGATIVE   Bilirubin Urine NEGATIVE NEGATIVE   Ketones, ur NEGATIVE NEGATIVE mg/dL   Protein, ur NEGATIVE NEGATIVE mg/dL   Nitrite NEGATIVE NEGATIVE   Leukocytes,Ua NEGATIVE NEGATIVE  CBC with Differential/Platelet     Status: Abnormal   Collection Time: 01/14/22  9:36 AM  Result Value Ref Range   WBC 9.0 4.0 - 10.5 K/uL   RBC 4.01 3.87 - 5.11 MIL/uL   Hemoglobin 11.1 (L) 12.0 - 15.0 g/dL   HCT 33.1 (L) 36.0 - 46.0 %   MCV 82.5 80.0 - 100.0 fL   MCH 27.7 26.0 - 34.0 pg   MCHC 33.5 30.0 - 36.0 g/dL   RDW 14.3 11.5 - 15.5 %   Platelets 316 150 - 400 K/uL   nRBC 0.0 0.0 - 0.2 %   Neutrophils Relative % 68 %   Neutro Abs 6.2 1.7 - 7.7 K/uL   Lymphocytes Relative 24 %   Lymphs Abs 2.1 0.7 - 4.0 K/uL   Monocytes Relative 6 %   Monocytes Absolute 0.5 0.1 - 1.0 K/uL   Eosinophils Relative 1 %   Eosinophils Absolute 0.1 0.0 - 0.5 K/uL   Basophils Relative 0 %   Basophils Absolute 0.0 0.0 - 0.1 K/uL   Immature Granulocytes 1 %   Abs Immature Granulocytes 0.07 0.00 - 0.07 K/uL  Comprehensive metabolic panel     Status: Abnormal   Collection Time: 01/14/22  9:36 AM  Result Value Ref Range   Sodium 134 (L) 135 - 145 mmol/L   Potassium 3.7 3.5 - 5.1 mmol/L   Chloride 107 98 - 111 mmol/L   CO2 19 (L) 22 - 32 mmol/L   Glucose, Bld 90 70 - 99 mg/dL   BUN 5 (L) 6 - 20 mg/dL   Creatinine, Ser 0.65 0.44 - 1.00 mg/dL   Calcium 9.0 8.9 - 10.3 mg/dL   Total Protein 6.2 (L) 6.5 - 8.1 g/dL   Albumin 3.0 (L) 3.5 - 5.0 g/dL   AST 17 15 - 41 U/L   ALT 17 0 - 44 U/L   Alkaline Phosphatase 38 38 - 126 U/L   Total Bilirubin 0.2 (L) 0.3 - 1.2 mg/dL   GFR, Estimated >60 >60 mL/min   Anion gap 8 5 - 15   CT  ABDOMEN PELVIS W CONTRAST  Result Date: 01/14/2022 CLINICAL DATA:  Right lower quadrant abdominal pain, pregnant, concern for appendicitis EXAM: CT ABDOMEN AND PELVIS WITH CONTRAST TECHNIQUE: Multidetector CT imaging of the abdomen and pelvis was performed using the standard protocol following bolus administration of intravenous contrast. RADIATION DOSE REDUCTION: This exam was performed according to the departmental dose-optimization program which includes automated exposure control, adjustment of the mA and/or kV according to patient size and/or use of iterative reconstruction technique. CONTRAST:  136mL OMNIPAQUE IOHEXOL 300 MG/ML  SOLN COMPARISON:  10/04/2021 FINDINGS: Lower chest: No acute abnormality. Hepatobiliary: No solid liver abnormality is seen. Small, benign cyst of the anterior right lobe of the liver (series 3, image 22). No gallstones, gallbladder wall thickening, or biliary dilatation. Pancreas: Unremarkable. No pancreatic ductal dilatation or surrounding inflammatory changes. Spleen: Normal in size without significant abnormality. Adrenals/Urinary Tract: Adrenal glands are unremarkable. Kidneys are normal, without renal calculi, solid lesion, or hydronephrosis. Bladder is unremarkable. Stomach/Bowel: Stomach is within normal limits. Appendix appears normal (series 3, image 53). No evidence of bowel wall thickening, distention, or inflammatory changes. Vascular/Lymphatic: No significant vascular findings are present. No enlarged abdominal or pelvic lymph nodes. Reproductive: Gravid  uterus.  Bilateral ovarian follicles. Other: No abdominal wall hernia or abnormality. No ascites. Musculoskeletal: No acute or significant osseous findings. IMPRESSION: 1. No acute CT findings of the abdomen or pelvis to explain right lower quadrant pain. Normal appendix. 2. Gravid uterus. Electronically Signed   By: Delanna Ahmadi M.D.   On: 01/14/2022 12:45     Assessment and Plan  --26 y.o. G3P0020 at [redacted]w[redacted]d   --FHT 152 by Doppler --Normal CBC --Normal appendix --Musculoskeletal pain in second trimester --Advised maternity belt, warm bath, Tylenol as needed --Discharge home in stable condition  Darlina Rumpf, CNM 01/14/2022, 1:52 PM

## 2022-01-14 NOTE — MAU Note (Signed)
Pain started 2 days ago.  Sharp pain in RLQ around to back. Was going to try to wait for next appt, but is just too uncomfortable. Only time it stops is when she sits. Denies bleeding, LOF or discharge. No GI or GU complaints.

## 2022-01-14 NOTE — MAU Note (Signed)
Dilaudid 0.5mg  was wasted.  Waste witnessed by Darnelle Catalan.  Unable to put in pyxis as med came from pharmacy.

## 2022-01-15 ENCOUNTER — Emergency Department (HOSPITAL_BASED_OUTPATIENT_CLINIC_OR_DEPARTMENT_OTHER)
Admission: EM | Admit: 2022-01-15 | Discharge: 2022-01-15 | Disposition: A | Discharge status: Home / Self Care | Payer: Medicaid Other | Attending: Emergency Medicine | Admitting: Emergency Medicine

## 2022-01-15 ENCOUNTER — Emergency Department (HOSPITAL_BASED_OUTPATIENT_CLINIC_OR_DEPARTMENT_OTHER): Payer: Medicaid Other

## 2022-01-15 ENCOUNTER — Other Ambulatory Visit: Payer: Self-pay

## 2022-01-15 ENCOUNTER — Encounter (HOSPITAL_BASED_OUTPATIENT_CLINIC_OR_DEPARTMENT_OTHER): Payer: Self-pay | Admitting: Emergency Medicine

## 2022-01-15 DIAGNOSIS — O09892 Supervision of other high risk pregnancies, second trimester: Secondary | ICD-10-CM | POA: Insufficient documentation

## 2022-01-15 DIAGNOSIS — W19XXXA Unspecified fall, initial encounter: Secondary | ICD-10-CM

## 2022-01-15 DIAGNOSIS — O9A212 Injury, poisoning and certain other consequences of external causes complicating pregnancy, second trimester: Secondary | ICD-10-CM | POA: Diagnosis not present

## 2022-01-15 DIAGNOSIS — Z3A19 19 weeks gestation of pregnancy: Secondary | ICD-10-CM | POA: Insufficient documentation

## 2022-01-15 DIAGNOSIS — Z7982 Long term (current) use of aspirin: Secondary | ICD-10-CM | POA: Diagnosis not present

## 2022-01-15 DIAGNOSIS — Z2914 Encounter for prophylactic rabies immune globin: Secondary | ICD-10-CM | POA: Insufficient documentation

## 2022-01-15 DIAGNOSIS — S3991XA Unspecified injury of abdomen, initial encounter: Secondary | ICD-10-CM | POA: Insufficient documentation

## 2022-01-15 DIAGNOSIS — Y9355 Activity, bike riding: Secondary | ICD-10-CM | POA: Diagnosis not present

## 2022-01-15 DIAGNOSIS — R109 Unspecified abdominal pain: Secondary | ICD-10-CM

## 2022-01-15 MED ORDER — RHO D IMMUNE GLOBULIN 1500 UNIT/2ML IJ SOSY
300.0000 ug | PREFILLED_SYRINGE | Freq: Once | INTRAMUSCULAR | Status: AC
  Administered 2022-01-15: 300 ug via INTRAMUSCULAR
  Filled 2022-01-15: qty 2

## 2022-01-15 NOTE — ED Triage Notes (Signed)
Patient arrived via POV c/o fall, landing on stomach while [redacted] weeks pregnant. Patient denies LOC, N/V. Patient is AO x 4, VS WDL, normal gait.

## 2022-01-15 NOTE — Discharge Instructions (Signed)
Do not ride scooters while pregnant  Please return to ED immediately for any vaginal bleeding, leakage of fluids, worsening abdominal pain

## 2022-01-15 NOTE — ED Notes (Signed)
Ultra sound to bedside.

## 2022-01-15 NOTE — ED Provider Notes (Signed)
[facility]ERGENCY DEPARTMENT Provider Note   CSN: [id number] Arrival date & time: 01/15/22  1932     History  Chief Complaint  Patient presents with   Melanee Spry MAREE LEGALL is a 26 y.o. female.  Patient is a 26 yo female presenting at [redacted] weeks pregnant for fall. Pt states she was riding a child's scooter when she fell forward landing on her abdomen. Pt denies new abdominal pain associated with fall. Denies leakage of fluids or vaginal bleeding.   The history is provided by the patient. No language interpreter was used.  Fall Pertinent negatives include no chest pain, no abdominal pain and no shortness of breath.      Home Medications Prior to Admission medications   Medication Sig Start Date End Date Taking? Authorizing Provider  aspirin 81 MG EC tablet Take 1 tablet (81 mg total) by mouth daily. Swallow whole. 11/25/21   Roma Schanz, CNM  cyclobenzaprine (FLEXERIL) 10 MG tablet Take 1 tablet (10 mg total) by mouth every 8 (eight) hours as needed for muscle spasms. 11/21/21   Florian Buff, MD  Doxylamine-Pyridoxine (DICLEGIS) 10-10 MG TBEC 2 tabs q hs, if sx persist add 1 tab q am on day 3, if sx persist add 1 tab q afternoon on day 4 09/29/21   Roma Schanz, CNM  glycopyrrolate (ROBINUL) 1 MG tablet Take 1 tablet (1 mg total) by mouth 3 (three) times daily. Patient not taking: Reported on 12/24/2021 10/15/21   Myrtis Ser, CNM  ondansetron (ZOFRAN ODT) 4 MG disintegrating tablet Take 1 tablet (4 mg total) by mouth every 6 (six) hours as needed for nausea or vomiting. 10/22/21   Darlina Rumpf, CNM  ondansetron (ZOFRAN) 4 MG tablet Take 1 tablet (4 mg total) by mouth every 6 (six) hours as needed for nausea or vomiting. 10/07/21   Orpah Greek, MD  Prenatal MV & Min w/FA-DHA (PRENATAL ADULT GUMMY/DHA/FA PO) Take by mouth. Takes 2 daily    [provider]  progesterone (PROMETRIUM) 200 MG capsule Place 1 capsule (200 mg  total) vaginally at bedtime. Patient not taking: Reported on 12/24/2021 09/30/21   Roma Schanz, CNM  promethazine (PHENERGAN) 25 MG tablet Take 1 tablet (25 mg total) by mouth every 6 (six) hours as needed for nausea or vomiting. Patient not taking: Reported on 12/24/2021 10/22/21   Darlina Rumpf, CNM      Allergies    Patient has no known allergies.    Review of Systems   Review of Systems  Constitutional:  Negative for chills and fever.  HENT:  Negative for ear pain and sore throat.   Eyes:  Negative for pain and visual disturbance.  Respiratory:  Negative for cough and shortness of breath.   Cardiovascular:  Negative for chest pain and palpitations.  Gastrointestinal:  Negative for abdominal pain and vomiting.  Genitourinary:  Negative for dysuria and hematuria.  Musculoskeletal:  Negative for arthralgias and back pain.  Skin:  Negative for color change and rash.  Neurological:  Negative for seizures and syncope.  All other systems reviewed and are negative.  Physical Exam Updated Vital Signs BP 128/84 (BP Location: Right Arm)    Pulse 89    Temp 99.2 F (37.3 C) (Oral)    Resp 20    Ht 5\' 4"  (1.626 m)    Wt 89.8 kg    LMP 07/30/2021    SpO2 100%    BMI  33.99 kg/m  Physical Exam Vitals and nursing note reviewed.  Constitutional:      General: She is not in acute distress.    Appearance: She is well-developed.  HENT:     Head: Normocephalic and atraumatic.  Eyes:     Conjunctiva/sclera: Conjunctivae normal.  Cardiovascular:     Rate and Rhythm: Normal rate and regular rhythm.     Heart sounds: No murmur heard. Pulmonary:     Effort: Pulmonary effort is normal. No respiratory distress.     Breath sounds: Normal breath sounds.  Abdominal:     Palpations: Abdomen is soft.     Tenderness: There is no abdominal tenderness.  Musculoskeletal:        General: No swelling.     Cervical back: Neck supple.  Skin:    General: Skin is warm and dry.     Capillary  Refill: Capillary refill takes less than 2 seconds.  Neurological:     Mental Status: She is alert.  Psychiatric:        Mood and Affect: Mood normal.    ED Results / Procedures / Treatments   Labs (all labs ordered are listed, but only abnormal results are displayed) Labs Reviewed - No data to display  EKG None  Radiology CT ABDOMEN PELVIS W CONTRAST  Result Date: 01/14/2022 CLINICAL DATA:  Right lower quadrant abdominal pain, pregnant, concern for appendicitis EXAM: CT ABDOMEN AND PELVIS WITH CONTRAST TECHNIQUE: Multidetector CT imaging of the abdomen and pelvis was performed using the standard protocol following bolus administration of intravenous contrast. RADIATION DOSE REDUCTION: This exam was performed according to the departmental dose-optimization program which includes automated exposure control, adjustment of the mA and/or kV according to patient size and/or use of iterative reconstruction technique. CONTRAST:  173mL OMNIPAQUE IOHEXOL 300 MG/ML  SOLN COMPARISON:  10/04/2021 FINDINGS: Lower chest: No acute abnormality. Hepatobiliary: No solid liver abnormality is seen. Small, benign cyst of the anterior right lobe of the liver (series 3, image 22). No gallstones, gallbladder wall thickening, or biliary dilatation. Pancreas: Unremarkable. No pancreatic ductal dilatation or surrounding inflammatory changes. Spleen: Normal in size without significant abnormality. Adrenals/Urinary Tract: Adrenal glands are unremarkable. Kidneys are normal, without renal calculi, solid lesion, or hydronephrosis. Bladder is unremarkable. Stomach/Bowel: Stomach is within normal limits. Appendix appears normal (series 3, image 53). No evidence of bowel wall thickening, distention, or inflammatory changes. Vascular/Lymphatic: No significant vascular findings are present. No enlarged abdominal or pelvic lymph nodes. Reproductive: Gravid uterus.  Bilateral ovarian follicles. Other: No abdominal wall hernia or  abnormality. No ascites. Musculoskeletal: No acute or significant osseous findings. IMPRESSION: 1. No acute CT findings of the abdomen or pelvis to explain right lower quadrant pain. Normal appendix. 2. Gravid uterus. Electronically Signed   By: Delanna Ahmadi M.D.   On: 01/14/2022 12:45    Procedures Procedures    Medications Ordered in ED Medications - No data to display  ED Course/ Medical Decision Making/ A&P                           Medical Decision Making Amount and/or Complexity of Data Reviewed Radiology: ordered.  Risk Prescription drug management.   8:13 PM 26 yo female presenting at [redacted] weeks pregnant for fall. Pt is Aox3, no acute distress, afebrile, with stable vitals. Abdomen is soft and nontender. Bedside US demonstrates stable fetal HR and fetal movement. Formal US ordered and stable. No vaginal bleeding or leakage of  fluids. I spoke with OB on call who recommends giving rhogam and follow up outpatient.   Patient in no distress and overall condition improved here in the ED. Detailed discussions were had with the patient regarding current findings, and need for close f/u with PCP or on call doctor. The patient has been instructed to return immediately if the symptoms worsen in any way for re-evaluation. Patient verbalized understanding and is in agreement with current care plan. All questions answered prior to discharge. .         Final Clinical Impression(s) / ED Diagnoses Final diagnoses:  Fall, initial encounter  Blunt trauma to abdomen, initial encounter  [redacted] weeks gestation of pregnancy    Rx / DC Orders ED Discharge Orders     None         Lianne Cure, DO [id number]

## 2022-01-20 ENCOUNTER — Other Ambulatory Visit: Payer: Self-pay

## 2022-01-20 ENCOUNTER — Ambulatory Visit (INDEPENDENT_AMBULATORY_CARE_PROVIDER_SITE_OTHER): Payer: Medicaid Other

## 2022-01-20 ENCOUNTER — Ambulatory Visit (INDEPENDENT_AMBULATORY_CARE_PROVIDER_SITE_OTHER): Payer: Medicaid Other | Admitting: Obstetrics & Gynecology

## 2022-01-20 VITALS — BP 117/71 | HR 72 | Wt 195.0 lb

## 2022-01-20 DIAGNOSIS — O210 Mild hyperemesis gravidarum: Secondary | ICD-10-CM

## 2022-01-20 DIAGNOSIS — Z363 Encounter for antenatal screening for malformations: Secondary | ICD-10-CM

## 2022-01-20 DIAGNOSIS — Z3480 Encounter for supervision of other normal pregnancy, unspecified trimester: Secondary | ICD-10-CM

## 2022-01-20 DIAGNOSIS — O26899 Other specified pregnancy related conditions, unspecified trimester: Secondary | ICD-10-CM

## 2022-01-20 DIAGNOSIS — Z3482 Encounter for supervision of other normal pregnancy, second trimester: Secondary | ICD-10-CM

## 2022-01-20 NOTE — Progress Notes (Signed)
[place] 21 wks,cephalic,posterior fundal placenta gr 0,normal ovaries,cx 3.2 cm,fhr 144 BPM,SVP of fluid 4.6 cm,EFW 377 g 345%,anatomy complete,no obvious abnormalities

## 2022-01-20 NOTE — Progress Notes (Signed)
° °  LOW-RISK PREGNANCY VISIT Patient name: Patricia Horton MRN [id number]  Date of birth: 1996/07/03 Chief Complaint:   Routine Prenatal Visit ([place] today)  History of Present Illness:   Patricia Horton is a 26 y.o. G19P0020 female at [redacted]w[redacted]d with an Estimated Date of Delivery: 06/02/22 being seen today for ongoing management of a low-risk pregnancy.   Depression screen [facility] 2/9 11/25/2021 06/22/2013  Decreased Interest 2 2  Down, Depressed, Hopeless 1 0  PHQ - 2 Score 3 2  Altered sleeping 2 1  Tired, decreased energy 3 2  Change in appetite 2 0  Feeling bad or failure about yourself  1 0  Trouble concentrating 0 0  Moving slowly or fidgety/restless 0 0  Suicidal thoughts 0 0  PHQ-9 Score 11 5    Today she reports  notes some round ligament pain . Contractions: Not present. Vag. Bleeding: None.  Movement: Present. denies leaking of fluid.   Seen in MAU due to fall, s/p Rhogam. Review of Systems:   Pertinent items are noted in HPI Denies abnormal vaginal discharge w/ itching/odor/irritation, headaches, visual changes, shortness of breath, chest pain, abdominal pain, severe nausea/vomiting, or problems with urination or bowel movements unless otherwise stated above. Pertinent History Reviewed:  Reviewed past medical,surgical, social, obstetrical and family history.  Reviewed problem list, medications and allergies.  Physical Assessment:   Vitals:   01/20/22 1551  BP: 117/71  Pulse: 72  Weight: 195 lb (88.5 kg)  Body mass index is 33.47 kg/m.        Physical Examination:   General appearance: Well appearing, and in no distress  Mental status: Alert, oriented to person, place, and time  Skin: Warm & dry  Respiratory: Normal respiratory effort, no distress  Abdomen: Soft, gravid, nontender  Pelvic: Cervical exam deferred         Extremities: Edema: None  Psych:  mood and affect appropriate  Fetal Status:     Movement: Present    [place] 21 wks,cephalic,posterior fundal placenta gr  0,normal ovaries,cx 3.2 cm,fhr 144 BPM,SVP of fluid 4.6 cm,EFW 377 g 345%,anatomy complete,no obvious abnormalities   Chaperone: n/a    No results found for this or any previous visit (from the past 24 hour(s)).   Assessment & Plan:  1) Low-risk pregnancy G3P0020 at [redacted]w[redacted]d with an Estimated Date of Delivery: 06/02/22     Meds: No orders of the defined types were placed in this encounter.  Labs/procedures today: anatomy scan  Plan:  Continue routine obstetrical care  Next visit: prefers in person    Reviewed: Preterm labor symptoms and general obstetric precautions including but not limited to vaginal bleeding, contractions, leaking of fluid and fetal movement were reviewed in detail with the patient.  All questions were answered. Given office BP cuff. Check bp weekly, let us know if >140/90.   Follow-up: Return in about 4 weeks (around 02/17/2022) for LROB visit.  No orders of the defined types were placed in this encounter.   Myna Hidalgo, DO Attending Obstetrician & Gynecologist, [facility] for [employer], [facility]

## 2022-02-02 ENCOUNTER — Other Ambulatory Visit: Payer: Self-pay | Admitting: *Deleted

## 2022-02-02 ENCOUNTER — Telehealth: Payer: Self-pay | Admitting: Women's Health

## 2022-02-02 MED ORDER — ONDANSETRON 4 MG PO TBDP: 4.0000 mg | ORAL_TABLET | Freq: Four times a day (QID) | ORAL | 3 refills | Status: DC | PRN

## 2022-02-02 NOTE — Telephone Encounter (Signed)
Refill request sent to provider.

## 2022-02-02 NOTE — Telephone Encounter (Signed)
Patient requests a refill on Zofran. She is still using the [facility] on [address]. Please advise.  ?

## 2022-02-18 ENCOUNTER — Ambulatory Visit (INDEPENDENT_AMBULATORY_CARE_PROVIDER_SITE_OTHER): Payer: Medicaid Other | Admitting: Obstetrics & Gynecology

## 2022-02-18 ENCOUNTER — Other Ambulatory Visit: Payer: Self-pay

## 2022-02-18 VITALS — BP 120/73 | HR 65 | Wt 200.6 lb

## 2022-02-18 DIAGNOSIS — Z3482 Encounter for supervision of other normal pregnancy, second trimester: Secondary | ICD-10-CM

## 2022-02-18 DIAGNOSIS — R21 Rash and other nonspecific skin eruption: Secondary | ICD-10-CM

## 2022-02-18 NOTE — Progress Notes (Signed)
? ?  LOW-RISK PREGNANCY VISIT ?Patient name: Patricia Horton MRN [id number]  Date of birth: [birth year] ?Chief Complaint:   ?Routine Prenatal Visit ? ?History of Present Illness:   ?Patricia Horton is a 26 y.o. G75P0020 female at [redacted]w[redacted]d with an Estimated Date of Delivery: 06/02/22 being seen today for ongoing management of a low-risk pregnancy.  ? ?-Rash: Significant itching and rash that has continued to spread from in between breast to entire chest.  Tried OTC moisturizers with no improvement ? ? ?  11/25/2021  ?  2:11 PM 06/22/2013  ? 12:58 PM  ?Depression screen PHQ 2/9  ?Decreased Interest 2 2  ?Down, Depressed, Hopeless 1 0  ?PHQ - 2 Score 3 2  ?Altered sleeping 2 1  ?Tired, decreased energy 3 2  ?Change in appetite 2 0  ?Feeling bad or failure about yourself  1 0  ?Trouble concentrating 0 0  ?Moving slowly or fidgety/restless 0 0  ?Suicidal thoughts 0 0  ?PHQ-9 Score 11 5  ? ? ? Contractions: Not present. Vag. Bleeding: None.  Movement: Present. denies leaking of fluid. ?Review of Systems:   ?Pertinent items are noted in HPI ?Denies abnormal vaginal discharge w/ itching/odor/irritation, headaches, visual changes, shortness of breath, chest pain, abdominal pain, severe nausea/vomiting, or problems with urination or bowel movements unless otherwise stated above. ?Pertinent History Reviewed:  ?Reviewed past medical,surgical, social, obstetrical and family history.  ?Reviewed problem list, medications and allergies. ? ?Physical Assessment:  ? ?Vitals:  ? 02/18/22 1151  ?BP: 120/73  ?Pulse: 65  ?Weight: 200 lb 9.6 oz (91 kg)  ?Body mass index is 34.43 kg/m?. ?  ?     Physical Examination:  ? General appearance: Well appearing, and in no distress ? Mental status: Alert, oriented to person, place, and time ? Skin: on chest- flat multiple hyperpigmented ovoid-shaped macules ? Respiratory: Normal respiratory effort, no distress ? Abdomen: Soft, gravid, nontender ? Pelvic: Cervical exam deferred        ? Extremities: Edema:  Trace ? Psych:  mood and affect appropriate ? ?Fetal Status: Fetal Heart Rate (bpm): 155 Fundal Height: 25 cm Movement: Present   ? ?Chaperone: n/a   ? ?No results found for this or any previous visit (from the past 24 hour(s)).  ? ?Assessment & Plan:  ?1) Low-risk pregnancy G3P0020 at [redacted]w[redacted]d with an Estimated Date of Delivery: 06/02/22  ? ?2) Rash- ? Tinea versicolor ?Advised trying OTC dandruff products- if no improvement within a few weeks, call back to clinic- consider trial of low dose steroid cream  ?  ?Meds: No orders of the defined types were placed in this encounter. ? ?Labs/procedures today: none, PN2 next visit ? ?Plan:  Continue routine obstetrical care  ?Next visit: prefers in person   ? ?Reviewed: Preterm labor symptoms and general obstetric precautions including but not limited to vaginal bleeding, contractions, leaking of fluid and fetal movement were reviewed in detail with the patient.  All questions were answered. Pt has home bp cuff. Check bp weekly, let us know if >140/90.  ? ?Follow-up: Return in about 3 weeks (around 03/11/2022) for LROB visit, PN-2. ? ?No orders of the defined types were placed in this encounter. ? ? ?Myna Hidalgo, DO ?Attending Obstetrician & Gynecologist, Faculty Practice ?Center for [employer], [facility] ? ? ? ?

## 2022-02-19 ENCOUNTER — Encounter: Payer: Self-pay | Admitting: *Deleted

## 2022-02-25 ENCOUNTER — Encounter (HOSPITAL_COMMUNITY): Payer: Self-pay | Admitting: Obstetrics & Gynecology

## 2022-02-25 ENCOUNTER — Inpatient Hospital Stay (HOSPITAL_COMMUNITY)
Admission: AD | Admit: 2022-02-25 | Discharge: 2022-02-26 | Disposition: A | Discharge status: Home / Self Care | Payer: Medicaid Other | Attending: Obstetrics & Gynecology | Admitting: Obstetrics & Gynecology

## 2022-02-25 DIAGNOSIS — O210 Mild hyperemesis gravidarum: Secondary | ICD-10-CM | POA: Diagnosis not present

## 2022-02-25 DIAGNOSIS — R0981 Nasal congestion: Secondary | ICD-10-CM | POA: Diagnosis not present

## 2022-02-25 DIAGNOSIS — Z3A26 26 weeks gestation of pregnancy: Secondary | ICD-10-CM | POA: Diagnosis not present

## 2022-02-25 DIAGNOSIS — R12 Heartburn: Secondary | ICD-10-CM | POA: Diagnosis not present

## 2022-02-25 DIAGNOSIS — Z3689 Encounter for other specified antenatal screening: Secondary | ICD-10-CM | POA: Insufficient documentation

## 2022-02-25 DIAGNOSIS — J45909 Unspecified asthma, uncomplicated: Secondary | ICD-10-CM | POA: Diagnosis not present

## 2022-02-25 DIAGNOSIS — O99512 Diseases of the respiratory system complicating pregnancy, second trimester: Secondary | ICD-10-CM | POA: Insufficient documentation

## 2022-02-25 DIAGNOSIS — Z7951 Long term (current) use of inhaled steroids: Secondary | ICD-10-CM | POA: Diagnosis not present

## 2022-02-25 DIAGNOSIS — O26892 Other specified pregnancy related conditions, second trimester: Secondary | ICD-10-CM | POA: Diagnosis not present

## 2022-02-25 DIAGNOSIS — Z6791 Unspecified blood type, Rh negative: Secondary | ICD-10-CM

## 2022-02-25 DIAGNOSIS — Z20822 Contact with and (suspected) exposure to covid-19: Secondary | ICD-10-CM | POA: Diagnosis not present

## 2022-02-25 DIAGNOSIS — Z8616 Personal history of COVID-19: Secondary | ICD-10-CM | POA: Insufficient documentation

## 2022-02-25 MED ORDER — LIDOCAINE VISCOUS HCL 2 % MT SOLN
15.0000 mL | Freq: Once | OROMUCOSAL | Status: AC
  Administered 2022-02-26: 15 mL via ORAL
  Filled 2022-02-25: qty 15

## 2022-02-25 MED ORDER — ALUM & MAG HYDROXIDE-SIMETH 200-200-20 MG/5ML PO SUSP
30.0000 mL | Freq: Once | ORAL | Status: AC
  Administered 2022-02-26: 30 mL via ORAL
  Filled 2022-02-25: qty 30

## 2022-02-25 NOTE — MAU Provider Note (Addendum)
?History  ?  ? ?CSN: [id number] ? ?Arrival date and time: 02/25/22 2300 ? ? Event Date/Time  ? First Provider Initiated Contact with Patient 02/25/22 2349   ?  ? ?Chief Complaint  ?Patient presents with  ? Cough  ? Heartburn  ? ?HPI ?Patricia Horton is a 26 y.o. G3P0020 at 3420w1d who presents via EMS for cough and heartburn. She reports she has a cough and congestion x1 week. She reports she had a negative COVID test last week when the symptoms started. She also reports she had COVID on 1/18. She reports it hurts when she swallows the mucous. She reports she is taking robitussin with no relief but has not tried anything else. She reports her heartburn is getting worse as the pregnancy continues. She denies any abdominal pain, vaginal bleeding or discharge. She reports normal fetal movement.  ? ?OB History   ? ? Gravida  ?3  ? Para  ?0  ? Term  ?0  ? Preterm  ?0  ? AB  ?2  ? Living  ?0  ?  ? ? SAB  ?2  ? IAB  ?0  ? Ectopic  ?0  ? Multiple  ?0  ? Live Births  ?0  ?   ?  ?  ? ? ?Past Medical History:  ?Diagnosis Date  ? Asthma   ? Migraine with aura 2014  ? NO ESTROGEN  ? Seizures (HCC)   ? last seizure 2017  ? ? ?Past Surgical History:  ?Procedure Laterality Date  ? DILATION AND EVACUATION N/A 10/01/2020  ? Procedure: DILATATION AND EVACUATION;  Surgeon: Conan Bowensavis, Kelly M, MD;  Location: [facility];  Service: Gynecology;  Laterality: N/A;  ? NO PAST SURGERIES    ? ? ?Family History  ?Problem Relation Age of Onset  ? Diabetes Mother   ? Stroke Mother   ? Hypertension Mother   ? Healthy Father   ? Heart disease Paternal Aunt   ? Cancer Paternal Grandmother   ?     breast  ? Heart disease Paternal Grandmother   ? ? ?Social History  ? ?Tobacco Use  ? Smoking status: Never  ? Smokeless tobacco: Never  ?Vaping Use  ? Vaping Use: Never used  ?Substance Use Topics  ? Alcohol use: Not Currently  ?  Comment: occ  ? Drug use: No  ? ? ?Allergies: No Known Allergies ? ?Medications Prior to Admission  ?Medication Sig  Dispense Refill Last Dose  ? aspirin 81 MG EC tablet Take 1 tablet (81 mg total) by mouth daily. Swallow whole. 90 tablet 3 02/24/2022  ? ondansetron (ZOFRAN ODT) 4 MG disintegrating tablet Take 1 tablet (4 mg total) by mouth every 6 (six) hours as needed for nausea or vomiting. 30 tablet 3 02/25/2022  ? Prenatal MV & Min w/FA-DHA (PRENATAL ADULT GUMMY/DHA/FA PO) Take by mouth. Takes 2 daily   02/25/2022  ? cyclobenzaprine (FLEXERIL) 10 MG tablet Take 1 tablet (10 mg total) by mouth every 8 (eight) hours as needed for muscle spasms. 30 tablet 1 More than a month  ? Doxylamine-Pyridoxine (DICLEGIS) 10-10 MG TBEC 2 tabs q hs, if sx persist add 1 tab q am on day 3, if sx persist add 1 tab q afternoon on day 4 100 tablet 6   ? glycopyrrolate (ROBINUL) 1 MG tablet Take 1 tablet (1 mg total) by mouth 3 (three) times daily. (Patient not taking: Reported on 12/24/2021) 90 tablet 1   ? ondansetron (ZOFRAN)  4 MG tablet Take 1 tablet (4 mg total) by mouth every 6 (six) hours as needed for nausea or vomiting. (Patient not taking: Reported on 01/20/2022) 15 tablet 0   ? progesterone (PROMETRIUM) 200 MG capsule Place 1 capsule (200 mg total) vaginally at bedtime. (Patient not taking: Reported on 12/24/2021) 20 capsule 3   ? promethazine (PHENERGAN) 25 MG tablet Take 1 tablet (25 mg total) by mouth every 6 (six) hours as needed for nausea or vomiting. (Patient not taking: Reported on 12/24/2021) 30 tablet 3   ? ? ?Review of Systems  ?Constitutional: Negative.  Negative for fatigue and fever.  ?HENT:  Positive for congestion and postnasal drip.   ?Respiratory:  Positive for cough. Negative for shortness of breath.   ?Cardiovascular: Negative.  Negative for chest pain.  ?Gastrointestinal: Negative.  Negative for abdominal pain, constipation, diarrhea, nausea and vomiting.  ?Genitourinary: Negative.  Negative for dysuria, vaginal bleeding and vaginal discharge.  ?Neurological: Negative.  Negative for dizziness and headaches.  ?Physical Exam   ? ?Blood pressure 115/73, pulse 88, temperature 98.4 ?F (36.9 ?C), height 5\' 3"  (1.6 m), weight 90.7 kg, last menstrual period 07/30/2021, SpO2 100 %. ? ?Physical Exam ?Vitals and nursing note reviewed.  ?Constitutional:   ?   General: She is not in acute distress. ?   Appearance: She is well-developed.  ?HENT:  ?   Head: Normocephalic.  ?Eyes:  ?   Pupils: Pupils are equal, round, and reactive to light.  ?Cardiovascular:  ?   Rate and Rhythm: Normal rate and regular rhythm.  ?   Heart sounds: Normal heart sounds.  ?Pulmonary:  ?   Effort: Pulmonary effort is normal. No respiratory distress.  ?   Breath sounds: Normal breath sounds.  ?Abdominal:  ?   General: Bowel sounds are normal. There is no distension.  ?   Palpations: Abdomen is soft.  ?   Tenderness: There is no abdominal tenderness.  ?Skin: ?   General: Skin is warm and dry.  ?Neurological:  ?   Mental Status: She is alert and oriented to person, place, and time.  ?Psychiatric:     ?   Mood and Affect: Mood normal.     ?   Behavior: Behavior normal.     ?   Thought Content: Thought content normal.     ?   Judgment: Judgment normal.  ? ?Fetal Tracing: ? ?Baseline: 150 ?Variability: moderate ?Accels: 10x10 ?Decels: none ? ?Toco: none ? ?MAU Course  ?Procedures ?Results for orders placed or performed during the hospital encounter of 02/25/22 (from the past 24 hour(s))  ?Urinalysis, Routine w reflex microscopic     Status: Abnormal  ? Collection Time: 02/25/22 11:25 PM  ?Result Value Ref Range  ? Color, Urine AMBER (A) YELLOW  ? APPearance CLOUDY (A) CLEAR  ? Specific Gravity, Urine >1.030 (H) 1.005 - 1.030  ? pH 6.0 5.0 - 8.0  ? Glucose, UA NEGATIVE NEGATIVE mg/dL  ? Hgb urine dipstick NEGATIVE NEGATIVE  ? Bilirubin Urine MODERATE (A) NEGATIVE  ? Ketones, ur >80 (A) NEGATIVE mg/dL  ? Protein, ur NEGATIVE NEGATIVE mg/dL  ? Nitrite NEGATIVE NEGATIVE  ? Leukocytes,Ua NEGATIVE NEGATIVE  ?  ?MDM ?UA ?GI Cocktail ?NST reactive ?LR bolus ?Pepcid IV ? ?Care turned  over to E. Sacha Topor NP at 0000.  ?02/27/22, CNM ?02/26/22 ? ?Patient reports improvement after meds. EKG normal. Vitals remain stable.  ?Assessment and Plan  ? ?1. Heartburn during pregnancy in second trimester  ?-  Rx pepcid. Discussed taking daily.   ?2. Hyperemesis gravidarum   ?3. Asthma affecting pregnancy in second trimester  ?-Reports using albuterol neb twice weekly (even prior to illness). Encouraged her to discussed asthma management with her PCP as she likely needs maintenance inhaler ?-Discussed OTC medications she can take for cough & congestion  ?4. [redacted] weeks gestation of pregnancy   ? ?Judeth Horn, NP  ?

## 2022-02-25 NOTE — MAU Note (Addendum)
I have been sick for a week. My chest feels like "something is being dragged down it real hard" when I eat, swallow, or "just about anything". Does have acid reflux. No meds for reflux. Some chest congestion and cough. Has yellow sputum sometimes and yellow nasal secretions. Had a covid test a wk ago and was negative. Robitussin not helping. No pregnancy concerns. Some pressure at times in lower abd when turns in bed but feels that is due to coughing so much. I have had n/v for couple days. No diarrhea. Good FM ?

## 2022-02-26 DIAGNOSIS — Z7951 Long term (current) use of inhaled steroids: Secondary | ICD-10-CM | POA: Diagnosis not present

## 2022-02-26 DIAGNOSIS — O26892 Other specified pregnancy related conditions, second trimester: Secondary | ICD-10-CM | POA: Diagnosis not present

## 2022-02-26 DIAGNOSIS — O99512 Diseases of the respiratory system complicating pregnancy, second trimester: Secondary | ICD-10-CM | POA: Diagnosis not present

## 2022-02-26 DIAGNOSIS — O210 Mild hyperemesis gravidarum: Secondary | ICD-10-CM

## 2022-02-26 DIAGNOSIS — Z3A26 26 weeks gestation of pregnancy: Secondary | ICD-10-CM | POA: Diagnosis not present

## 2022-02-26 DIAGNOSIS — Z3689 Encounter for other specified antenatal screening: Secondary | ICD-10-CM | POA: Diagnosis not present

## 2022-02-26 DIAGNOSIS — R12 Heartburn: Secondary | ICD-10-CM | POA: Diagnosis not present

## 2022-02-26 DIAGNOSIS — R0981 Nasal congestion: Secondary | ICD-10-CM | POA: Diagnosis not present

## 2022-02-26 DIAGNOSIS — J45909 Unspecified asthma, uncomplicated: Secondary | ICD-10-CM | POA: Diagnosis not present

## 2022-02-26 DIAGNOSIS — Z20822 Contact with and (suspected) exposure to covid-19: Secondary | ICD-10-CM | POA: Diagnosis not present

## 2022-02-26 DIAGNOSIS — Z8616 Personal history of COVID-19: Secondary | ICD-10-CM | POA: Diagnosis not present

## 2022-02-26 LAB — URINALYSIS, ROUTINE W REFLEX MICROSCOPIC
Glucose, UA: NEGATIVE mg/dL
Hgb urine dipstick: NEGATIVE
Ketones, ur: >80 mg/dL — AB
Leukocytes,Ua: NEGATIVE
Nitrite: NEGATIVE
Protein, ur: NEGATIVE mg/dL
Specific Gravity, Urine: >1.03 — ABNORMAL HIGH (ref 1.005–1.030)
pH: 6 (ref 5.0–8.0)

## 2022-02-26 MED ORDER — LACTATED RINGERS IV BOLUS
1000.0000 mL | Freq: Once | INTRAVENOUS | Status: AC
  Administered 2022-02-26: 1000 mL via INTRAVENOUS

## 2022-02-26 MED ORDER — ALBUTEROL SULFATE HFA 108 (90 BASE) MCG/ACT IN AERS
2.0000 | INHALATION_SPRAY | Freq: Once | RESPIRATORY_TRACT | Status: AC
Start: 2022-02-26 — End: 2022-02-26
  Administered 2022-02-26: 2 via RESPIRATORY_TRACT
  Filled 2022-02-26: qty 6.7

## 2022-02-26 MED ORDER — FAMOTIDINE 20 MG PO TABS
20.0000 mg | ORAL_TABLET | Freq: Every day | ORAL | 2 refills | Status: DC
Start: 2022-02-26 — End: 2022-04-08

## 2022-02-26 MED ORDER — FAMOTIDINE IN NACL 20-0.9 MG/50ML-% IV SOLN
20.0000 mg | Freq: Once | INTRAVENOUS | Status: AC
  Administered 2022-02-26: 20 mg via INTRAVENOUS
  Filled 2022-02-26: qty 50

## 2022-02-26 NOTE — Discharge Instructions (Signed)

## 2022-03-11 ENCOUNTER — Encounter: Payer: Self-pay | Admitting: Women's Health

## 2022-03-11 ENCOUNTER — Ambulatory Visit (INDEPENDENT_AMBULATORY_CARE_PROVIDER_SITE_OTHER): Payer: Medicaid Other | Admitting: Women's Health

## 2022-03-11 ENCOUNTER — Other Ambulatory Visit: Payer: Medicaid Other

## 2022-03-11 VITALS — BP 112/71 | HR 89 | Wt 205.0 lb

## 2022-03-11 DIAGNOSIS — Z131 Encounter for screening for diabetes mellitus: Secondary | ICD-10-CM | POA: Diagnosis not present

## 2022-03-11 DIAGNOSIS — Z3483 Encounter for supervision of other normal pregnancy, third trimester: Secondary | ICD-10-CM

## 2022-03-11 DIAGNOSIS — D563 Thalassemia minor: Secondary | ICD-10-CM

## 2022-03-11 DIAGNOSIS — Z3A28 28 weeks gestation of pregnancy: Secondary | ICD-10-CM | POA: Diagnosis not present

## 2022-03-11 DIAGNOSIS — Z3480 Encounter for supervision of other normal pregnancy, unspecified trimester: Secondary | ICD-10-CM

## 2022-03-11 NOTE — Progress Notes (Signed)
? ? ?LOW-RISK PREGNANCY VISIT ?Patient name: Patricia Horton MRN [id number]  Date of birth: 05-17-1996 ?Chief Complaint:   ?Routine Prenatal Visit (PN2 today) ? ?History of Present Illness:   ?Patricia Horton is a 26 y.o. G49P0020 female at [redacted]w[redacted]d with an Estimated Date of Delivery: 06/02/22 being seen today for ongoing management of a low-risk pregnancy.  ? ?Today she reports no complaints. Contractions: Not present. Vag. Bleeding: None.  Movement: Present. denies leaking of fluid. ? ? ?  03/11/2022  ? 10:04 AM 11/25/2021  ?  2:11 PM 06/22/2013  ? 12:58 PM  ?Depression screen PHQ 2/9  ?Decreased Interest 0 2 2  ?Down, Depressed, Hopeless 2 1 0  ?PHQ - 2 Score 2 3 2   ?Altered sleeping 0 2 1  ?Tired, decreased energy 2 3 2   ?Change in appetite 0 2 0  ?Feeling bad or failure about yourself  1 1 0  ?Trouble concentrating 0 0 0  ?Moving slowly or fidgety/restless 0 0 0  ?Suicidal thoughts 0 0 0  ?PHQ-9 Score 5 11 5   ? ?  ? ?  03/11/2022  ? 10:04 AM 11/25/2021  ?  2:11 PM 10/16/2020  ?  1:43 PM  ?GAD 7 : Generalized Anxiety Score  ?Nervous, Anxious, on Edge 0 1 3  ?Control/stop worrying 2 0 3  ?Worry too much - different things 2 0 0  ?Trouble relaxing 1 2 2   ?Restless 0 2 0  ?Easily annoyed or irritable 3 1 3   ?Afraid - awful might happen 0 0 3  ?Total GAD 7 Score 8 6 14   ?Anxiety Difficulty   Not difficult at all  ? ? ?  ?Review of Systems:   ?Pertinent items are noted in HPI ?Denies abnormal vaginal discharge w/ itching/odor/irritation, headaches, visual changes, shortness of breath, chest pain, abdominal pain, severe nausea/vomiting, or problems with urination or bowel movements unless otherwise stated above. ?Pertinent History Reviewed:  ?Reviewed past medical,surgical, social, obstetrical and family history.  ?Reviewed problem list, medications and allergies. ?Physical Assessment:  ? ?Vitals:  ? 03/11/22 1000  ?BP: 112/71  ?Pulse: 89  ?Weight: 205 lb (93 kg)  ?Body mass index is 36.31 kg/m?. ?  ?     Physical Examination:   ? General appearance: Well appearing, and in no distress ? Mental status: Alert, oriented to person, place, and time ? Skin: Warm & dry ? Cardiovascular: Normal heart rate noted ? Respiratory: Normal respiratory effort, no distress ? Abdomen: Soft, gravid, nontender ? Pelvic: Cervical exam deferred        ? Extremities: Edema: Trace ? ?Fetal Status: Fetal Heart Rate (bpm): 152 Fundal Height: 28 cm Movement: Present   ? ?Chaperone: N/A   ?No results found for this or any previous visit (from the past 24 hour(s)).  ?Assessment & Plan:  ?1) Low-risk pregnancy G3P0020 at [redacted]w[redacted]d with an Estimated Date of Delivery: 06/02/22  ? ?2) Silent carrier alpha-thalassemia, FOB who is present today declines testing ?  ?Meds: No orders of the defined types were placed in this encounter. ? ?Labs/procedures today: PN2 and declined tdap (h/o seizures/neuro issues) ? ?Plan:  Continue routine obstetrical care  ?Next visit: prefers in person   ? ?Reviewed: Preterm labor symptoms and general obstetric precautions including but not limited to vaginal bleeding, contractions, leaking of fluid and fetal movement were reviewed in detail with the patient.  All questions were answered. Does have home bp cuff. Office bp cuff given: not applicable. Check bp weekly, let  us know if consistently >140 and/or >90. ? ?Follow-up: Return in about 4 weeks (around 04/08/2022) for LROB, CNM, in person. ? ?Future Appointments  ?Date Time Provider Department Center  ?04/08/2022  9:30 AM Marny Lowenstein, PA-C [facility]  ? ? ?No orders of the defined types were placed in this encounter. ? ?Cheral Marker CNM, WHNP-BC ?03/11/2022 ?10:37 AM  ?

## 2022-03-11 NOTE — Patient Instructions (Signed)
Patricia Horton, thank you for choosing our office today! We appreciate the opportunity to meet your healthcare needs. You may receive a short survey by mail, e-mail, or through [employer]. If you are happy with your care we would appreciate if you could take just a few minutes to complete the survey questions. We read all of your comments and take your feedback very seriously. Thank you again for choosing our office.  ?Center for [employer] Team at [facility] ? ?Women's & [place] at [facility] ?([address] [place], [place] [address]) ?Entrance C, located off of [address] ?Free 24/7 valet parking  ? ?CLASSES: Go to [email or web address] to register for classes (childbirth, breastfeeding, waterbirth, infant CPR, daddy bootcamp, etc.) ? ?Call the office [phone]) or go to [facility] if: ?You begin to have strong, frequent contractions ?Your water breaks.  Sometimes it is a big gush of fluid, sometimes it is just a trickle that keeps getting your panties wet or running down your legs ?You have vaginal bleeding.  It is normal to have a small amount of spotting if your cervix was checked.  ?You don't feel your baby moving like normal.  If you don't, get you something to eat and drink and lay down and focus on feeling your baby move.   If your baby is still not moving like normal, you should call the office or go to [facility]. ? ?Call the office [phone]) or go to [facility] hospital for these signs of pre-eclampsia: ?Severe headache that does not go away with Tylenol ?Visual changes- seeing spots, double, blurred vision ?Pain under your right breast or upper abdomen that does not go away with Tums or heartburn medicine ?Nausea and/or vomiting ?Severe swelling in your hands, feet, and face  ? ?Tdap Vaccine ?It is recommended that you get the Tdap vaccine during the third trimester of EACH pregnancy to help protect your baby from getting pertussis (whooping cough) ?27-36 weeks is the BEST time to do  this so that you can pass the protection on to your baby. During pregnancy is better than after pregnancy, but if you are unable to get it during pregnancy it will be offered at the hospital.  ?You can get this vaccine with [place], at the health department, your family doctor, or some local pharmacies ?Everyone who will be around your baby should also be up-to-date on their vaccines before the baby comes. Adults (who are not pregnant) only need 1 dose of Tdap during adulthood.  ? ?[place] Pediatricians/Family Doctors ?[facility]): [address] [place], [phone]           ?[facility]: [address], [phone]                ?[facility]): [place], [phone] (call to ask if accepting patients) ?[facility] Department: [place] [address], [place], [place]   ? ?[place] Pediatricians/Family Doctors ?[facility]): [address], [phone] ?[place]: [address] [place], [phone] ?[facility] [place]: [place], [phone] ? ?[place]  ?[facility]): [phone] ?[facility]: [place], [phone]  ? ?[place] ?[place]: [place] [address] [phone] ? ?[place]  ?[facility]: [phone], [phone] ? ?Home Blood Pressure Monitoring for Patients  ? ?Your provider has recommended that you check your  blood pressure (BP) at least once a week at home. If you do not have a blood pressure cuff at home, one will be provided for you. Contact your provider if you have not received your monitor within 1 week.  ? ?Helpful Tips for Accurate Home Blood Pressure Checks  ?Don't smoke, exercise, or drink caffeine 30 minutes before checking your BP ?Use the restroom before checking your BP (a full bladder can raise your  pressure) ?Relax in a comfortable upright chair ?Feet on the ground ?Left arm resting comfortably on a flat surface at the level of your heart ?Legs uncrossed ?Back supported ?Sit quietly and don't talk ?Place the cuff on your bare arm ?Adjust snuggly, so that only two fingertips can fit between your skin and the top of the cuff ?Check 2 readings separated by at least one minute ?Keep a log of your BP readings ?For a visual, please reference this diagram: http://ccnc.care/bpdiagram ? ?Provider Name: [facility]     Phone: [phone] ? ?Zone 1: ALL CLEAR  ?Continue to monitor your symptoms:  ?BP reading is less than 140 (top number) or less than 90 (bottom number)  ?No right upper stomach pain ?No headaches or seeing spots ?No feeling nauseated or throwing up ?No swelling in face and hands ? ?Zone 2: CAUTION ?Call your doctor's office for any of the following:  ?BP reading is greater than 140 (top number) or greater than 90 (bottom number)  ?Stomach pain under your ribs in the middle or right side ?Headaches or seeing spots ?Feeling nauseated or throwing up ?Swelling in face and hands ? ?Zone 3: EMERGENCY  ?Seek immediate medical care if you have any of the following:  ?BP reading is greater than160 (top number) or greater than 110 (bottom number) ?Severe headaches not improving with Tylenol ?Serious difficulty catching your breath ?Any worsening symptoms from Zone 2  ? ?Third Trimester of Pregnancy ?The third trimester is from week 29 through week 42, months 7 through 9. The third trimester is a time when the fetus is growing rapidly. At the end of the ninth month, the fetus is about 20 inches in length and weighs 6-10 pounds.  ?BODY CHANGES ?Your body goes through many changes during pregnancy. The changes vary from woman to woman.  ?Your weight will continue to increase. You can expect to gain 25-35 pounds (11-16 kg) by the end of the pregnancy. ?You may begin to get stretch marks on your hips, abdomen,  and breasts. ?You may urinate more often because the fetus is moving lower into your pelvis and pressing on your bladder. ?You may develop or continue to have heartburn as a result of your pregnancy. ?You may develop constipation because certain hormones are causing the muscles that push waste through your intestines to slow down. ?You may develop hemorrhoids or swollen, bulging veins (varicose veins). ?You may have pelvic pain because of the weight gain and pregnancy hormones relaxing your joints between the bones in your pelvis. Backaches may result from overexertion of the muscles supporting your posture. ?You may have changes in your hair. These can include thickening of your hair, rapid growth, and changes in texture. Some women also have hair loss during or after pregnancy, or hair that feels dry or thin. Your hair will most likely return to normal after your baby is born. ?Your breasts will continue to grow and be tender. A yellow discharge may leak from your breasts called colostrum. ?Your belly button may stick out. ?You may  feel short of breath because of your expanding uterus. ?You may notice the fetus "dropping," or moving lower in your abdomen. ?You may have a bloody mucus discharge. This usually occurs a few days to a week before labor begins. ?Your cervix becomes thin and soft (effaced) near your due date. ?WHAT TO EXPECT AT [place]  ?You will have prenatal exams every 2 weeks until week 36. Then, you will have weekly prenatal exams. During a routine prenatal visit: ?You will be weighed to make sure you and the fetus are growing normally. ?Your blood pressure is taken. ?Your abdomen will be measured to track your baby's growth. ?The fetal heartbeat will be listened to. ?Any test results from the previous visit will be discussed. ?You may have a cervical check near your due date to see if you have effaced. ?At around 36 weeks, your caregiver will check your cervix. At the same time, your  caregiver will also perform a test on the secretions of the vaginal tissue. This test is to determine if a type of bacteria, Group B streptococcus, is present. Your caregiver will explain this further. ?You

## 2022-03-13 LAB — AB SCR+ANTIBODY ID: Antibody Screen: POSITIVE — AB

## 2022-03-13 LAB — CBC
Hematocrit: 34.9 % (ref 34.0–46.6)
Hemoglobin: 11.5 g/dL (ref 11.1–15.9)
MCH: 27.2 pg (ref 26.6–33.0)
MCHC: 33 g/dL (ref 31.5–35.7)
MCV: 83 fL (ref 79–97)
Platelets: 422 10*3/uL (ref 150–450)
RBC: 4.23 x10E6/uL (ref 3.77–5.28)
RDW: 13.4 % (ref 11.7–15.4)
WBC: 9 10*3/uL (ref 3.4–10.8)

## 2022-03-13 LAB — GLUCOSE TOLERANCE, 2 HOURS W/ 1HR
Glucose, 1 hour: 125 mg/dL (ref 70–179)
Glucose, 2 hour: 123 mg/dL (ref 70–152)
Glucose, Fasting: 89 mg/dL (ref 70–91)

## 2022-03-13 LAB — ANTIBODY SCREEN

## 2022-03-13 LAB — RPR: RPR Ser Ql: NONREACTIVE

## 2022-03-13 LAB — HIV ANTIBODY (ROUTINE TESTING W REFLEX): HIV Screen 4th Generation wRfx: NONREACTIVE

## 2022-04-08 ENCOUNTER — Ambulatory Visit (INDEPENDENT_AMBULATORY_CARE_PROVIDER_SITE_OTHER): Payer: Medicaid Other | Admitting: Medical

## 2022-04-08 ENCOUNTER — Encounter: Payer: Self-pay | Admitting: Medical

## 2022-04-08 VITALS — BP 110/72 | HR 75 | Wt 202.0 lb

## 2022-04-08 DIAGNOSIS — Z3A32 32 weeks gestation of pregnancy: Secondary | ICD-10-CM | POA: Diagnosis not present

## 2022-04-08 DIAGNOSIS — Z3480 Encounter for supervision of other normal pregnancy, unspecified trimester: Secondary | ICD-10-CM | POA: Diagnosis not present

## 2022-04-08 DIAGNOSIS — R569 Unspecified convulsions: Secondary | ICD-10-CM

## 2022-04-08 DIAGNOSIS — D563 Thalassemia minor: Secondary | ICD-10-CM

## 2022-04-08 DIAGNOSIS — Z6791 Unspecified blood type, Rh negative: Secondary | ICD-10-CM | POA: Diagnosis not present

## 2022-04-08 DIAGNOSIS — R12 Heartburn: Secondary | ICD-10-CM

## 2022-04-08 DIAGNOSIS — O26899 Other specified pregnancy related conditions, unspecified trimester: Secondary | ICD-10-CM | POA: Diagnosis not present

## 2022-04-08 DIAGNOSIS — O26893 Other specified pregnancy related conditions, third trimester: Secondary | ICD-10-CM

## 2022-04-08 MED ORDER — FAMOTIDINE 20 MG PO TABS: 20.0000 mg | ORAL_TABLET | Freq: Every day | ORAL | 6 refills | Status: DC

## 2022-04-08 NOTE — Progress Notes (Signed)
? ?  PRENATAL VISIT NOTE ? ?Subjective:  ?Patricia Horton is a 26 y.o. G3P0020 at [redacted]w[redacted]d being seen today for ongoing prenatal care.  She is currently monitored for the following issues for this high-risk pregnancy and has Seizures (HCC); Migraine with aura; Hyperemesis gravidarum; Encounter for supervision of normal pregnancy, antepartum; Rh negative state in antepartum period; and Alpha thalassemia silent carrier on their problem list. ? ?Patient reports heartburn, occasional contractions and leg cramps.  Contractions: Irritability. Vag. Bleeding: None.  Movement: Present. Denies leaking of fluid.  ? ?The following portions of the patient's history were reviewed and updated as appropriate: allergies, current medications, past family history, past medical history, past social history, past surgical history and problem list.  ? ?Objective:  ? ?Vitals:  ? 04/08/22 0957  ?BP: 110/72  ?Pulse: 75  ?Weight: 202 lb (91.6 kg)  ? ? ?Fetal Status: Fetal Heart Rate (bpm): 155 Fundal Height: 32 cm Movement: Present    ? ?General:  Alert, oriented and cooperative. Patient is in no acute distress.  ?Skin: Skin is warm and dry. No rash noted.   ?Cardiovascular: Normal heart rate noted  ?Respiratory: Normal respiratory effort, no problems with respiration noted  ?Abdomen: Soft, gravid, appropriate for gestational age.  Pain/Pressure: Present     ?Pelvic: Cervical exam deferred        ?Extremities: Normal range of motion.  Edema: Trace  ?Mental Status: Normal mood and affect. Normal behavior. Normal judgment and thought content.  ? ?Assessment and Plan:  ?Pregnancy: G3P0020 at [redacted]w[redacted]d ?1. Supervision of other normal pregnancy, antepartum ?- RHO (D) Immune Globulin ? ?2. Rh negative state in antepartum period ?- RHO (D) Immune Globulin ? ?3. Seizures (HCC) ? ?4. Alpha thalassemia silent carrier ? ?5. [redacted] weeks gestation of pregnancy ? ?6. Heartburn in pregnancy in third trimester ?- Discussed diet for heartburn in pregnancy, timing of  medication and eating as related to bedtime ?- famotidine (PEPCID) 20 MG tablet; Take 1 tablet (20 mg total) by mouth daily.  Dispense: 30 tablet; Refill: 6 ? ?7. Leg cramps ?- Patient will increase PO hydration with [employer] drinks  ?- Continue H2O intake ?- Mg supplement if continued symptoms  ?Preterm labor symptoms and general obstetric precautions including but not limited to vaginal bleeding, contractions, leaking of fluid and fetal movement were reviewed in detail with the patient. ?Please refer to After Visit Summary for other counseling recommendations.  ? ?Return in about 2 weeks (around 04/22/2022) for LOB, In-Person, any provider. ? ?Future Appointments  ?Date Time Provider Department Center  ?04/22/2022 11:30 AM Arabella Merles, CNM [facility]  ? ? ?Vonzella Nipple, PA-C ? ?

## 2022-04-22 ENCOUNTER — Ambulatory Visit (INDEPENDENT_AMBULATORY_CARE_PROVIDER_SITE_OTHER): Payer: Medicaid Other | Admitting: Advanced Practice Midwife

## 2022-04-22 VITALS — BP 120/75 | HR 84 | Wt 205.0 lb

## 2022-04-22 DIAGNOSIS — Z3480 Encounter for supervision of other normal pregnancy, unspecified trimester: Secondary | ICD-10-CM

## 2022-04-22 DIAGNOSIS — Z302 Encounter for sterilization: Secondary | ICD-10-CM | POA: Insufficient documentation

## 2022-04-22 DIAGNOSIS — Z6791 Unspecified blood type, Rh negative: Secondary | ICD-10-CM

## 2022-04-22 DIAGNOSIS — Z3A34 34 weeks gestation of pregnancy: Secondary | ICD-10-CM

## 2022-04-22 DIAGNOSIS — O26899 Other specified pregnancy related conditions, unspecified trimester: Secondary | ICD-10-CM

## 2022-04-22 MED ORDER — FAMOTIDINE 40 MG PO TABS: 40.0000 mg | ORAL_TABLET | Freq: Every day | ORAL | 6 refills | Status: DC

## 2022-04-22 MED ORDER — PROMETHAZINE HCL 25 MG PO TABS: 25.0000 mg | ORAL_TABLET | Freq: Four times a day (QID) | ORAL | 1 refills | Status: DC | PRN

## 2022-04-22 NOTE — Progress Notes (Signed)
   LOW-RISK PREGNANCY VISIT Patient name: Patricia Horton MRN [id number]  Date of birth: Nov 16, 1996 Chief Complaint:   Routine Prenatal Visit  History of Present Illness:   Patricia Horton is a 26 y.o. G3P0020 female at [redacted]w[redacted]d with an Estimated Date of Delivery: 06/02/22 being seen today for ongoing management of a low-risk pregnancy.  Today she reports  heartburn resistant to Pepcid 20mg  and Tums; having N/V resistant to Zofran; constipation with no BM in 2wks . Contractions: Irritability. Vag. Bleeding: None.  Movement: Present. denies leaking of fluid. Review of Systems:   Pertinent items are noted in HPI Denies abnormal vaginal discharge w/ itching/odor/irritation, headaches, visual changes, shortness of breath, chest pain, abdominal pain, severe nausea/vomiting, or problems with urination or bowel movements unless otherwise stated above. Pertinent History Reviewed:  Reviewed past medical,surgical, social, obstetrical and family history.  Reviewed problem list, medications and allergies. Physical Assessment:   Vitals:   04/22/22 1147  BP: 120/75  Pulse: 84  Weight: 205 lb (93 kg)  Body mass index is 36.31 kg/m.        Physical Examination:   General appearance: Well appearing, and in no distress  Mental status: Alert, oriented to person, place, and time  Skin: Warm & dry  Cardiovascular: Normal heart rate noted  Respiratory: Normal respiratory effort, no distress  Abdomen: Soft, gravid, nontender  Pelvic: Cervical exam deferred         Extremities: Edema: Trace  Fetal Status: Fetal Heart Rate (bpm): 162 Fundal Height: 34 cm Movement: Present    No results found for this or any previous visit (from the past 24 hour(s)).  Assessment & Plan:  1) Low-risk pregnancy G3P0020 at [redacted]w[redacted]d with an Estimated Date of Delivery: 06/02/22   2) Constipation, rec Miralax as directed; start stool softener 2/day; may use Fleet's enema if Miralax not successful (hasn't had BM in 2wks)  3) GERD,  increased dose of Pepcid to 40mg  daily  4) May want ppBTL, consent signed today; also considering IUD and equal effectiveness rev'd   Meds:  Meds ordered this encounter  Medications   famotidine (PEPCID) 40 MG tablet    Sig: Take 1 tablet (40 mg total) by mouth daily.    Dispense:  30 tablet    Refill:  6    Order Specific Question:   Supervising Provider    Answer:   08/03/22   promethazine (PHENERGAN) 25 MG tablet    Sig: Take 1 tablet (25 mg total) by mouth every 6 (six) hours as needed for nausea or vomiting.    Dispense:  30 tablet    Refill:  1    Order Specific Question:   Supervising Provider    Answer:   Myna Hidalgo [[id number]]   Labs/procedures today: none  Plan:  Continue routine obstetrical care   Reviewed: Preterm labor symptoms and general obstetric precautions including but not limited to vaginal bleeding, contractions, leaking of fluid and fetal movement were reviewed in detail with the patient.  All questions were answered. Has home bp cuff.  Check bp weekly, let Myna Hidalgo know if >140/90.   Follow-up: Return in about 2 weeks (around 05/06/2022) for LROB, in person; GBS/cultures, Sign BTL consent today.  No orders of the defined types were placed in this encounter.  [place] CNM 04/22/2022 12:33 PM

## 2022-05-02 ENCOUNTER — Inpatient Hospital Stay (HOSPITAL_COMMUNITY)
Admission: AD | Admit: 2022-05-02 | Discharge: 2022-05-02 | Disposition: A | Discharge status: Home / Self Care | Payer: Medicaid Other | Attending: Obstetrics and Gynecology | Admitting: Obstetrics and Gynecology

## 2022-05-02 DIAGNOSIS — O99513 Diseases of the respiratory system complicating pregnancy, third trimester: Secondary | ICD-10-CM | POA: Diagnosis not present

## 2022-05-02 DIAGNOSIS — O210 Mild hyperemesis gravidarum: Secondary | ICD-10-CM | POA: Diagnosis not present

## 2022-05-02 DIAGNOSIS — O26893 Other specified pregnancy related conditions, third trimester: Secondary | ICD-10-CM | POA: Insufficient documentation

## 2022-05-02 DIAGNOSIS — Z3A35 35 weeks gestation of pregnancy: Secondary | ICD-10-CM | POA: Insufficient documentation

## 2022-05-02 LAB — URINALYSIS, ROUTINE W REFLEX MICROSCOPIC
Bilirubin Urine: NEGATIVE
Glucose, UA: 150 mg/dL — AB
Ketones, ur: 5 mg/dL — AB
Leukocytes,Ua: NEGATIVE
Nitrite: NEGATIVE
Protein, ur: 30 mg/dL — AB
RBC / HPF: >50 RBC/hpf — ABNORMAL HIGH (ref 0–5)
Specific Gravity, Urine: 1.035 — ABNORMAL HIGH (ref 1.005–1.030)
pH: 5 (ref 5.0–8.0)

## 2022-05-02 MED ORDER — SCOPOLAMINE 1 MG/3DAYS TD PT72: 1.0000 | MEDICATED_PATCH | TRANSDERMAL | 12 refills | Status: DC

## 2022-05-02 MED ORDER — PROCHLORPERAZINE MALEATE 10 MG PO TABS: 10.0000 mg | ORAL_TABLET | Freq: Two times a day (BID) | ORAL | 2 refills | Status: DC | PRN

## 2022-05-02 MED ORDER — METOCLOPRAMIDE HCL 10 MG PO TABS: 10.0000 mg | ORAL_TABLET | Freq: Four times a day (QID) | ORAL | 2 refills | Status: DC

## 2022-05-02 NOTE — Discharge Instructions (Signed)

## 2022-05-02 NOTE — MAU Note (Signed)
Cleone Slim CNM in Family Rm to see pt and discuss plan of care

## 2022-05-02 NOTE — MAU Note (Signed)
.  Patricia Horton is a 26 y.o. at [redacted]w[redacted]d here in MAU reporting seeing blood in her emesis when she throws up. States she has had n/v 1st and 3rd Trimesters. Third trimester not as bad as first but still there. She has seen blood in her emesis 3 times today. She was not gonna come but her mother was concerned. Good FM. Denies VB  or LOF. Has some pelvic pressure when she walks but that is nothing new and has mentioned to providers.  Onset of complaint: today Pain score: 0 Vitals:   05/02/22 1956 05/02/22 1959  BP:  135/82  Pulse: 86   Resp: 20   Temp: 98.2 F (36.8 C)   SpO2: 100%      FHT:125 Lab orders placed from triage:  none

## 2022-05-02 NOTE — Progress Notes (Signed)
Cleone Slim CNM in earlier to discuss d/c plan with pt. Written and verbal d/c instructions given and understanding voiced.

## 2022-05-02 NOTE — MAU Provider Note (Signed)
History     CSN: [id number]  Arrival date and time: 05/02/22 1947   Event Date/Time   First Provider Initiated Contact with Patient 05/02/22 2026      Chief Complaint  Patient presents with   Hematemesis   HPI  Patricia Horton is a 26 y.o. G3P0020 at [redacted]w[redacted]d who presents for evaluation of blood in her vomit. Patient reports she has struggled with vomiting throughout the pregnancy. She reports it was bad in the first trimester, better in the second and is bad again in the third trimester. She saw blood 3 times today and her mother wanted her to get checked. She reports she is taking pepcid which she feels like is helping, zofran that doesn't help and phenergan but she cannot take phenergan regularly because it makes her too tired. She denies any pain.  She denies any vaginal bleeding, discharge, and leaking of fluid. Denies any constipation, diarrhea or any urinary complaints. Reports normal fetal movement.   OB History     Gravida  3   Para  0   Term  0   Preterm  0   AB  2   Living  0      SAB  2   IAB  0   Ectopic  0   Multiple  0   Live Births  0           Past Medical History:  Diagnosis Date   Asthma    Migraine with aura 2014   NO ESTROGEN   Seizures ([place])    last seizure 2017    Past Surgical History:  Procedure Laterality Date   DILATION AND EVACUATION N/A 10/01/2020   Procedure: DILATATION AND EVACUATION;  Surgeon: Sloan Leiter, MD;  Location: [facility];  Service: Gynecology;  Laterality: N/A;   NO PAST SURGERIES      Family History  Problem Relation Age of Onset   Diabetes Mother    Stroke Mother    Hypertension Mother    Healthy Father    Heart disease Paternal Aunt    Cancer Paternal Grandmother        breast   Heart disease Paternal Grandmother     Social History   Tobacco Use   Smoking status: Never   Smokeless tobacco: Never  Vaping Use   Vaping Use: Never used  Substance Use Topics   Alcohol use: Not  Currently    Comment: occ   Drug use: No    Allergies: No Known Allergies  Medications Prior to Admission  Medication Sig Dispense Refill Last Dose   Acetaminophen (TYLENOL PO) Take by mouth.      aspirin 81 MG EC tablet Take 1 tablet (81 mg total) by mouth daily. Swallow whole. 90 tablet 3    cyclobenzaprine (FLEXERIL) 10 MG tablet Take 1 tablet (10 mg total) by mouth every 8 (eight) hours as needed for muscle spasms. 30 tablet 1    famotidine (PEPCID) 40 MG tablet Take 1 tablet (40 mg total) by mouth daily. 30 tablet 6    ondansetron (ZOFRAN ODT) 4 MG disintegrating tablet Take 1 tablet (4 mg total) by mouth every 6 (six) hours as needed for nausea or vomiting. 30 tablet 3    Prenatal MV & Min w/FA-DHA (PRENATAL ADULT GUMMY/DHA/FA PO) Take by mouth. Takes 2 daily      promethazine (PHENERGAN) 25 MG tablet Take 1 tablet (25 mg total) by mouth every 6 (six) hours as needed for  nausea or vomiting. 30 tablet 1     Review of Systems  Constitutional: Negative.  Negative for fatigue and fever.  HENT: Negative.    Respiratory: Negative.  Negative for shortness of breath.   Cardiovascular: Negative.  Negative for chest pain.  Gastrointestinal:  Positive for nausea and vomiting. Negative for abdominal pain, constipation and diarrhea.  Genitourinary: Negative.  Negative for dysuria, vaginal bleeding and vaginal discharge.  Neurological: Negative.  Negative for dizziness and headaches.  Physical Exam   Blood pressure 135/82, pulse 86, temperature 98.2 F (36.8 C), resp. rate 20, height 5\' 4"  (1.626 m), weight 93.4 kg, last menstrual period 07/30/2021, SpO2 100 %.  Patient Vitals for the past 24 hrs:  BP Temp Pulse Resp SpO2 Height Weight  05/02/22 1959 135/82 -- -- -- -- -- --  05/02/22 1956 -- 98.2 F (36.8 C) 86 20 100 % 5\' 4"  (1.626 m) 93.4 kg    Physical Exam Vitals and nursing note reviewed.  Constitutional:      General: She is not in acute distress.    Appearance: She is  well-developed.  HENT:     Head: Normocephalic.  Eyes:     Pupils: Pupils are equal, round, and reactive to light.  Cardiovascular:     Rate and Rhythm: Normal rate and regular rhythm.     Heart sounds: Normal heart sounds.  Pulmonary:     Effort: Pulmonary effort is normal. No respiratory distress.     Breath sounds: Normal breath sounds.  Abdominal:     General: Bowel sounds are normal. There is no distension.     Palpations: Abdomen is soft.     Tenderness: There is no abdominal tenderness.  Skin:    General: Skin is warm and dry.  Neurological:     Mental Status: She is alert and oriented to person, place, and time.  Psychiatric:        Mood and Affect: Mood normal.        Behavior: Behavior normal.        Thought Content: Thought content normal.        Judgment: Judgment normal.   FHT: 125 bpm  MAU Course  Procedures  MDM Reassurance provided of normalcy of small amounts of blood in vomit with prolonged vomiting. CNM discussed options for antiemetics and patient desires more to be sent to pharmacy. Declines any in MAU. Lengthy discussion of how to use and which ones to take often.  Assessment and Plan   1. Hyperemesis gravidarum   2. [redacted] weeks gestation of pregnancy    -Discharge home in stable condition -Rx for compazine, reglan, and scopolamine sent to patient's pharmacy -Third trimester precautions discussed -Patient advised to follow-up with OB as scheduled for prenatal care -Patient may return to MAU as needed or if her condition were to change or worsen  Wende Mott, CNM 05/02/2022, 8:26 PM

## 2022-05-06 ENCOUNTER — Other Ambulatory Visit (HOSPITAL_COMMUNITY)
Admission: RE | Admit: 2022-05-06 | Discharge: 2022-05-06 | Disposition: A | Discharge status: Home / Self Care | Payer: Medicaid Other | Source: Ambulatory Visit | Attending: Advanced Practice Midwife | Admitting: Advanced Practice Midwife

## 2022-05-06 ENCOUNTER — Encounter: Payer: Self-pay | Admitting: Advanced Practice Midwife

## 2022-05-06 ENCOUNTER — Ambulatory Visit (INDEPENDENT_AMBULATORY_CARE_PROVIDER_SITE_OTHER): Payer: Medicaid Other | Admitting: Advanced Practice Midwife

## 2022-05-06 VITALS — BP 111/76 | HR 126 | Wt 207.4 lb

## 2022-05-06 DIAGNOSIS — Z3A36 36 weeks gestation of pregnancy: Secondary | ICD-10-CM

## 2022-05-06 DIAGNOSIS — Z3493 Encounter for supervision of normal pregnancy, unspecified, third trimester: Secondary | ICD-10-CM | POA: Diagnosis not present

## 2022-05-06 DIAGNOSIS — Z3480 Encounter for supervision of other normal pregnancy, unspecified trimester: Secondary | ICD-10-CM

## 2022-05-06 DIAGNOSIS — O210 Mild hyperemesis gravidarum: Secondary | ICD-10-CM | POA: Insufficient documentation

## 2022-05-06 NOTE — Progress Notes (Signed)
   LOW-RISK PREGNANCY VISIT Patient name: Patricia Horton MRN [id number]  Date of birth: Jan 31, 1996 Chief Complaint:   low risk gestation and Routine Prenatal Visit (Culture today/ having vaginal pain, think baby drop)  History of Present Illness:   Patricia Horton is a 26 y.o. G46P0020 female at [redacted]w[redacted]d with an Estimated Date of Delivery: 06/02/22 being seen today for ongoing management of a low-risk pregnancy.  Today she reports  discomforts of late preg . Contractions: Irregular.  .  Movement: Present. denies leaking of fluid. Review of Systems:   Pertinent items are noted in HPI Denies abnormal vaginal discharge w/ itching/odor/irritation, headaches, visual changes, shortness of breath, chest pain, abdominal pain, severe nausea/vomiting, or problems with urination or bowel movements unless otherwise stated above. Pertinent History Reviewed:  Reviewed past medical,surgical, social, obstetrical and family history.  Reviewed problem list, medications and allergies. Physical Assessment:   Vitals:   05/06/22 1134  BP: 111/76  Pulse: (!) 126  Weight: 207 lb 6.4 oz (94.1 kg)  Body mass index is 35.6 kg/m.        Physical Examination:   General appearance: Well appearing, and in no distress  Mental status: Alert, oriented to person, place, and time  Skin: Warm & dry  Cardiovascular: Normal heart rate noted  Respiratory: Normal respiratory effort, no distress  Abdomen: Soft, gravid, nontender  Pelvic: Cervical exam performed  Dilation: Closed Effacement (%): Thick Station: -3  Extremities: Edema: None  Fetal Status: Fetal Heart Rate (bpm): 158 Fundal Height: 35 cm Movement: Present Presentation: Vertex  No results found for this or any previous visit (from the past 24 hour(s)).  Assessment & Plan:  1) Low-risk pregnancy G3P0020 at [redacted]w[redacted]d with an Estimated Date of Delivery: 06/02/22   2) Plans for ppBTL, 30d papers signed 5/24 and under media tab  3) Discomforts of late preg   Meds: No  orders of the defined types were placed in this encounter.  Labs/procedures today: GBS/GC/chlam; SVE  Plan:  Continue routine obstetrical care   Reviewed: Term labor symptoms and general obstetric precautions including but not limited to vaginal bleeding, contractions, leaking of fluid and fetal movement were reviewed in detail with the patient.  All questions were answered. Has home bp cuff.  Check bp weekly, let us know if >140/90.   Follow-up: Return in about 1 week (around 05/13/2022) for LROB schedule out x 4 weeks.  Orders Placed This Encounter  Procedures   Culture, beta strep (group b only)   Arabella Merles [facility] 05/06/2022 12:09 PM

## 2022-05-07 LAB — CERVICOVAGINAL ANCILLARY ONLY
Chlamydia: NEGATIVE
Comment: NEGATIVE
Comment: NORMAL
Neisseria Gonorrhea: NEGATIVE

## 2022-05-09 ENCOUNTER — Encounter: Payer: Self-pay | Admitting: Advanced Practice Midwife

## 2022-05-09 DIAGNOSIS — B951 Streptococcus, group B, as the cause of diseases classified elsewhere: Secondary | ICD-10-CM | POA: Insufficient documentation

## 2022-05-09 LAB — CULTURE, BETA STREP (GROUP B ONLY): Strep Gp B Culture: POSITIVE — AB

## 2022-05-13 ENCOUNTER — Ambulatory Visit (INDEPENDENT_AMBULATORY_CARE_PROVIDER_SITE_OTHER): Payer: Medicaid Other | Admitting: Advanced Practice Midwife

## 2022-05-13 ENCOUNTER — Encounter: Payer: Self-pay | Admitting: Advanced Practice Midwife

## 2022-05-13 VITALS — BP 124/83 | HR 110 | Wt 210.0 lb

## 2022-05-13 DIAGNOSIS — Z3A37 37 weeks gestation of pregnancy: Secondary | ICD-10-CM

## 2022-05-13 DIAGNOSIS — Z3480 Encounter for supervision of other normal pregnancy, unspecified trimester: Secondary | ICD-10-CM

## 2022-05-13 DIAGNOSIS — Z302 Encounter for sterilization: Secondary | ICD-10-CM

## 2022-05-13 NOTE — Progress Notes (Signed)
   LOW-RISK PREGNANCY VISIT Patient name: Patricia Horton MRN [id number]  Date of birth: 10-Mar-1996 Chief Complaint:   Routine Prenatal Visit  History of Present Illness:   Patricia Horton is a 26 y.o. G3P0020 female at [redacted]w[redacted]d with an Estimated Date of Delivery: 06/02/22 being seen today for ongoing management of a low-risk pregnancy.  Today she reports  lots of pelvic pressure/discomfort . Contractions: Irritability. Vag. Bleeding: None.  Movement: Present. denies leaking of fluid. Review of Systems:   Pertinent items are noted in HPI Denies abnormal vaginal discharge w/ itching/odor/irritation, headaches, visual changes, shortness of breath, chest pain, abdominal pain, severe nausea/vomiting, or problems with urination or bowel movements unless otherwise stated above. Pertinent History Reviewed:  Reviewed past medical,surgical, social, obstetrical and family history.  Reviewed problem list, medications and allergies. Physical Assessment:   Vitals:   05/13/22 1102 05/13/22 1105  BP: 129/82 124/83  Pulse: (!) 110   Weight: 210 lb (95.3 kg)   Body mass index is 36.05 kg/m.        Physical Examination:   General appearance: Well appearing, and in no distress  Mental status: Alert, oriented to person, place, and time  Skin: Warm & dry  Cardiovascular: Normal heart rate noted  Respiratory: Normal respiratory effort, no distress  Abdomen: Soft, gravid, nontender  Pelvic: Cervical exam performed  Dilation: Closed Effacement (%): 50 Station: -3  Extremities: Edema: None  Fetal Status: Fetal Heart Rate (bpm): 158 Fundal Height: 37 cm Movement: Present Presentation: Vertex  No results found for this or any previous visit (from the past 24 hour(s)).  Assessment & Plan:  1) Low-risk pregnancy G3P0020 at [redacted]w[redacted]d with an Estimated Date of Delivery: 06/02/22   2) GBS+, will give PCN in labor  3) No longer wants BTL, unsure of contraception; has info on IUD; will decide at postpartum visit    Meds: No orders of the defined types were placed in this encounter.  Labs/procedures today: SVE  Plan:  Continue routine obstetrical care   Reviewed: Term labor symptoms and general obstetric precautions including but not limited to vaginal bleeding, contractions, leaking of fluid and fetal movement were reviewed in detail with the patient.  All questions were answered. Has home bp cuff. Check bp weekly, let us know if >140/90.   Follow-up: Return for As scheduled.  No orders of the defined types were placed in this encounter.  Arabella Merles CNM 05/13/2022 11:25 AM

## 2022-05-13 NOTE — Patient Instructions (Signed)
Try the positions on [email or web address].[email or web address] to help facilitate labor starting/also help labor to go better!

## 2022-05-19 ENCOUNTER — Ambulatory Visit (INDEPENDENT_AMBULATORY_CARE_PROVIDER_SITE_OTHER): Payer: Medicaid Other | Admitting: Women's Health

## 2022-05-19 ENCOUNTER — Encounter: Payer: Self-pay | Admitting: Women's Health

## 2022-05-19 VITALS — BP 122/80 | HR 95 | Wt 211.0 lb

## 2022-05-19 DIAGNOSIS — Z3403 Encounter for supervision of normal first pregnancy, third trimester: Secondary | ICD-10-CM

## 2022-05-19 DIAGNOSIS — R519 Headache, unspecified: Secondary | ICD-10-CM

## 2022-05-19 LAB — POCT URINALYSIS DIPSTICK OB
Blood, UA: NEGATIVE
Glucose, UA: NEGATIVE
Ketones, UA: NEGATIVE
Leukocytes, UA: NEGATIVE
Nitrite, UA: NEGATIVE
POC,PROTEIN,UA: NEGATIVE

## 2022-05-19 NOTE — Patient Instructions (Signed)
Emma-Lee, thank you for choosing our office today! We appreciate the opportunity to meet your healthcare needs. You may receive a short survey by mail, e-mail, or through [employer]. If you are happy with your care we would appreciate if you could take just a few minutes to complete the survey questions. We read all of your comments and take your feedback very seriously. Thank you again for choosing our office.  Center for [employer] Team at [facility]  [facility] ([address] [place], [place] [address]) Entrance C, located off of [address] Free 24/7 valet parking   CLASSES: Go to [email or web address] to register for classes (childbirth, breastfeeding, waterbirth, infant CPR, daddy bootcamp, etc.)  Call the office [phone]) or go to [facility] if: You begin to have strong, frequent contractions Your water breaks.  Sometimes it is a big gush of fluid, sometimes it is just a trickle that keeps getting your panties wet or running down your legs You have vaginal bleeding.  It is normal to have a small amount of spotting if your cervix was checked.  You don't feel your baby moving like normal.  If you don't, get you something to eat and drink and lay down and focus on feeling your baby move.   If your baby is still not moving like normal, you should call the office or go to [facility].  Call the office [phone]) or go to [facility] hospital for these signs of pre-eclampsia: Severe headache that does not go away with Tylenol Visual changes- seeing spots, double, blurred vision Pain under your right breast or upper abdomen that does not go away with Tums or heartburn medicine Nausea and/or vomiting Severe swelling in your hands, feet, and face   [facility] Pediatricians/Family Doctors [facility]): [address] Colette Ribas, [phone]           [facility]: [address], [phone]                 [facility]): [address], [phone] (call to ask if accepting patients) [facility] Department: [address] [place], [phone]    [facility] Pediatricians/Family Doctors [facility]): [address], [phone] [facility]: [address] [place], [phone] [facility]: [address], [phone]  [facility] Doctors  [facility]): [phone] [facility]: [address] [phone]   [facility] Doctors [facility]: [address], [phone]  [facility] Doctors  [facility]: [phone], [phone]  Home Blood Pressure Monitoring for Patients   Your provider has recommended that you check your blood pressure (BP) at least once a week at home. If you do not have a blood pressure cuff at home, one will be provided for you. Contact your provider if you have not received your monitor within 1 week.   Helpful Tips for Accurate Home Blood Pressure Checks  Don't smoke, exercise, or drink caffeine 30 minutes before checking your BP Use the restroom before checking your BP (a full bladder can raise your pressure) Relax in a comfortable upright chair Feet on the ground Left arm resting comfortably on a flat surface at the level of your heart Legs uncrossed Back supported Sit quietly and don't talk Place the cuff on your bare arm Adjust snuggly, so that only two fingertips  can fit between your skin and the top of the cuff Check 2 readings separated by at least one minute Keep a log of your BP readings For a visual, please reference this diagram: http://ccnc.care/bpdiagram  Provider Name: [facility]     Phone: [phone]  Zone 1: ALL CLEAR  Continue to monitor your symptoms:  BP reading is less than 140 (top number) or less than 90 (bottom number)  No right  upper stomach pain No headaches or seeing spots No feeling nauseated or throwing up No swelling in face and hands  Zone 2: CAUTION Call your doctor's office for any of the following:  BP reading is greater than 140 (top number) or greater than 90 (bottom number)  Stomach pain under your ribs in the middle or right side Headaches or seeing spots Feeling nauseated or throwing up Swelling in face and hands  Zone 3: EMERGENCY  Seek immediate medical care if you have any of the following:  BP reading is greater than160 (top number) or greater than 110 (bottom number) Severe headaches not improving with Tylenol Serious difficulty catching your breath Any worsening symptoms from Zone 2   Braxton Hicks Contractions Contractions of the uterus can occur throughout pregnancy, but they are not always a sign that you are in labor. You may have practice contractions called Braxton Hicks contractions. These false labor contractions are sometimes confused with true labor. What are [place] contractions? Braxton Hicks contractions are tightening movements that occur in the muscles of the uterus before labor. Unlike true labor contractions, these contractions do not result in opening (dilation) and thinning of the cervix. Toward the end of pregnancy (32-34 weeks), Braxton Hicks contractions can happen more often and may become stronger. These contractions are sometimes difficult to tell apart from true labor because they can be very uncomfortable. You should not feel embarrassed if you go to the hospital with false labor. Sometimes, the only way to tell if you are in true labor is for your health care provider to look for changes in the cervix. The health care provider will do a physical exam and may monitor your contractions. If you are not in true labor, the exam should show that your cervix is not dilating and your water has not broken. If there are no other health problems associated with your  pregnancy, it is completely safe for you to be sent home with false labor. You may continue to have Braxton Hicks contractions until you go into true labor. How to tell the difference between true labor and false labor True labor Contractions last 30-70 seconds. Contractions become very regular. Discomfort is usually felt in the top of the uterus, and it spreads to the lower abdomen and low back. Contractions do not go away with walking. Contractions usually become more intense and increase in frequency. The cervix dilates and gets thinner. False labor Contractions are usually shorter and not as strong as true labor contractions. Contractions are usually irregular. Contractions are often felt in the front of the lower abdomen and in the groin. Contractions may go away when you walk around or change positions while lying down. Contractions get weaker and are shorter-lasting as time goes on. The cervix usually does not dilate or become thin. Follow these instructions at home:  Take over-the-counter and prescription medicines only as told by your health care provider. Keep up with your usual exercises and follow other instructions from your health care provider. Eat and drink lightly if you think  you are going into labor. If Braxton Hicks contractions are making you uncomfortable: Change your position from lying down or resting to walking, or change from walking to resting. Sit and rest in a tub of warm water. Drink enough fluid to keep your urine pale yellow. Dehydration may cause these contractions. Do slow and deep breathing several times an hour. Keep all follow-up prenatal visits as told by your health care provider. This is important. Contact a health care provider if: You have a fever. You have continuous pain in your abdomen. Get help right away if: Your contractions become stronger, more regular, and closer together. You have fluid leaking or gushing from your vagina. You pass  blood-tinged mucus (bloody show). You have bleeding from your vagina. You have low back pain that you never had before. You feel your baby's head pushing down and causing pelvic pressure. Your baby is not moving inside you as much as it used to. Summary Contractions that occur before labor are called Braxton Hicks contractions, false labor, or practice contractions. Braxton Hicks contractions are usually shorter, weaker, farther apart, and less regular than true labor contractions. True labor contractions usually become progressively stronger and regular, and they become more frequent. Manage discomfort from [facility] contractions by changing position, resting in a warm bath, drinking plenty of water, or practicing deep breathing. This information is not intended to replace advice given to you by your health care provider. Make sure you discuss any questions you have with your health care provider. Document Revised: 10/29/2017 Document Reviewed: 04/01/2017 Elsevier Patient Education  [place].

## 2022-05-19 NOTE — Progress Notes (Signed)
LOW-RISK PREGNANCY VISIT Patient name: Patricia Horton MRN [id number]  Date of birth: 05-May-1996 Chief Complaint:   Routine Prenatal Visit  History of Present Illness:   Patricia Horton is a 26 y.o. G3P0020 female at [redacted]w[redacted]d with an Estimated Date of Delivery: 06/02/22 being seen today for ongoing management of a low-risk pregnancy.   Today she reports  miserable, swelling feet/hands, occ headaches-go away on their own. BP's at home wnl, checks TID, concerned about preeclampsia. Some diarrhea . Contractions: Irritability.  .  Movement: Present. denies leaking of fluid.     03/11/2022   10:04 AM 11/25/2021    2:11 PM 06/22/2013   12:58 PM  Depression screen PHQ 2/9  Decreased Interest 0 2 2  Down, Depressed, Hopeless 2 1 0  PHQ - 2 Score 2 3 2   Altered sleeping 0 2 1  Tired, decreased energy 2 3 2   Change in appetite 0 2 0  Feeling bad or failure about yourself  1 1 0  Trouble concentrating 0 0 0  Moving slowly or fidgety/restless 0 0 0  Suicidal thoughts 0 0 0  PHQ-9 Score 5 11 5         03/11/2022   10:04 AM 11/25/2021    2:11 PM 10/16/2020    1:43 PM  GAD 7 : Generalized Anxiety Score  Nervous, Anxious, on Edge 0 1 3  Control/stop worrying 2 0 3  Worry too much - different things 2 0 0  Trouble relaxing 1 2 2   Restless 0 2 0  Easily annoyed or irritable 3 1 3   Afraid - awful might happen 0 0 3  Total GAD 7 Score 8 6 14   Anxiety Difficulty   Not difficult at all      Review of Systems:   Pertinent items are noted in HPI Denies abnormal vaginal discharge w/ itching/odor/irritation, headaches, visual changes, shortness of breath, chest pain, abdominal pain, severe nausea/vomiting, or problems with urination or bowel movements unless otherwise stated above. Pertinent History Reviewed:  Reviewed past medical,surgical, social, obstetrical and family history.  Reviewed problem list, medications and allergies. Physical Assessment:   Vitals:   05/19/22 1044  BP: 122/80   Pulse: 95  Weight: 211 lb (95.7 kg)  Body mass index is 36.22 kg/m.        Physical Examination:   General appearance: Well appearing, and in no distress  Mental status: Alert, oriented to person, place, and time  Skin: Warm & dry  Cardiovascular: Normal heart rate noted  Respiratory: Normal respiratory effort, no distress  Abdomen: Soft, gravid, nontender  Pelvic: Cervical exam performed  Dilation: Closed Effacement (%): 50 Station: -3  Extremities: Edema: Trace  Fetal Status: Fetal Heart Rate (bpm): 145 Fundal Height: 38 cm Movement: Present Presentation: Vertex  Urine dipstick: neg protein  Chaperone:  Zwaye Banton, CMA    No results found for this or any previous visit (from the past 24 hour(s)).  Assessment & Plan:  1) Low-risk pregnancy G3P0020 at [redacted]w[redacted]d with an Estimated Date of Delivery: 06/02/22   2) Occ headaches, pt was concerned for pre-e, bp great, no proteinuria. To continue checking bp's daily at home, if >140/90 or pre-e s/s, let know/go to [facility]   Meds: No orders of the defined types were placed in this encounter.  Labs/procedures today: SVE  Plan:  Continue routine obstetrical care  Next visit: prefers in person    Reviewed: Term labor symptoms and general obstetric precautions including but not  limited to vaginal bleeding, contractions, leaking of fluid and fetal movement were reviewed in detail with the patient.  All questions were answered. Does have home bp cuff. Office bp cuff given: not applicable. Check bp daily, let us know if consistently >140 and/or >90.  Follow-up: Return for As scheduled.  Future Appointments  Date Time Provider Department Center  05/27/2022  1:50 PM Arabella Merles, CNM [facility]  06/03/2022 10:30 AM Arabella Merles, CNM [facility]    Orders Placed This Encounter  Procedures   POC Urinalysis Dipstick OB   Cheral Marker CNM, [facility] 05/19/2022 11:20 AM

## 2022-05-19 NOTE — Progress Notes (Signed)
Patient reports headaches and swelling in hands and feet

## 2022-05-27 ENCOUNTER — Encounter: Payer: Self-pay | Admitting: Advanced Practice Midwife

## 2022-05-27 ENCOUNTER — Ambulatory Visit (INDEPENDENT_AMBULATORY_CARE_PROVIDER_SITE_OTHER): Payer: Medicaid Other | Admitting: Advanced Practice Midwife

## 2022-05-27 VITALS — BP 129/68 | HR 83 | Wt 214.4 lb

## 2022-05-27 DIAGNOSIS — Z3480 Encounter for supervision of other normal pregnancy, unspecified trimester: Secondary | ICD-10-CM

## 2022-05-27 DIAGNOSIS — Z3A39 39 weeks gestation of pregnancy: Secondary | ICD-10-CM

## 2022-05-27 NOTE — Progress Notes (Signed)
   LOW-RISK PREGNANCY VISIT Patient name: Patricia Horton MRN [id number]  Date of birth: March 11, 1996 Chief Complaint:   Routine Prenatal Visit (Cervix check)  History of Present Illness:   Patricia Horton is a 26 y.o. G3P0020 female at [redacted]w[redacted]d with an Estimated Date of Delivery: 06/02/22 being seen today for ongoing management of a low-risk pregnancy.  Today she reports  some reflux, BH, and other discomforts of late preg . Contractions: Irritability.  .  Movement: Present. denies leaking of fluid. Review of Systems:   Pertinent items are noted in HPI Denies abnormal vaginal discharge w/ itching/odor/irritation, headaches, visual changes, shortness of breath, chest pain, abdominal pain, severe nausea/vomiting, or problems with urination or bowel movements unless otherwise stated above. Pertinent History Reviewed:  Reviewed past medical,surgical, social, obstetrical and family history.  Reviewed problem list, medications and allergies. Physical Assessment:   Vitals:   05/27/22 1354  BP: 129/68  Pulse: 83  Weight: 214 lb 6.4 oz (97.3 kg)  Body mass index is 36.8 kg/m.        Physical Examination:   General appearance: Well appearing, and in no distress  Mental status: Alert, oriented to person, place, and time  Skin: Warm & dry  Cardiovascular: Normal heart rate noted  Respiratory: Normal respiratory effort, no distress  Abdomen: Soft, gravid, nontender  Pelvic: Cervical exam performed  Dilation: Fingertip Effacement (%): 50 Station: -3  Extremities: Edema: Trace  Fetal Status: Fetal Heart Rate (bpm): 148 Fundal Height: 40 cm Movement: Present Presentation: Vertex  No results found for this or any previous visit (from the past 24 hour(s)).  Assessment & Plan:  1) Low-risk pregnancy G3P0020 at [redacted]w[redacted]d with an Estimated Date of Delivery: 06/02/22     Meds: No orders of the defined types were placed in this encounter.  Labs/procedures today: SVE  Plan:  Continue routine obstetrical care  with NST if still preg next week  Reviewed: Term labor symptoms and general obstetric precautions including but not limited to vaginal bleeding, contractions, leaking of fluid and fetal movement were reviewed in detail with the patient.  All questions were answered. Has home bp cuff.  Check bp weekly, let us know if >140/90.   Follow-up: Return for add  NST to 7/5 appt.  No orders of the defined types were placed in this encounter.  Arabella Merles CNM 05/27/2022 2:27 PM

## 2022-05-29 ENCOUNTER — Encounter: Payer: Self-pay | Admitting: Women's Health

## 2022-06-01 ENCOUNTER — Other Ambulatory Visit: Payer: Self-pay

## 2022-06-01 ENCOUNTER — Inpatient Hospital Stay (EMERGENCY_DEPARTMENT_HOSPITAL)
Admission: AD | Admit: 2022-06-01 | Discharge: 2022-06-01 | Disposition: A | Discharge status: Home / Self Care | Payer: Medicaid Other | Source: Home / Self Care | Attending: Obstetrics & Gynecology | Admitting: Obstetrics & Gynecology

## 2022-06-01 ENCOUNTER — Inpatient Hospital Stay (HOSPITAL_COMMUNITY): Payer: Medicaid Other | Admitting: Anesthesiology

## 2022-06-01 ENCOUNTER — Encounter (HOSPITAL_COMMUNITY): Payer: Self-pay | Admitting: Obstetrics & Gynecology

## 2022-06-01 ENCOUNTER — Encounter (HOSPITAL_COMMUNITY): Payer: Self-pay | Admitting: Obstetrics and Gynecology

## 2022-06-01 ENCOUNTER — Inpatient Hospital Stay (HOSPITAL_COMMUNITY)
Admission: AD | Admit: 2022-06-01 | Discharge: 2022-06-04 | DRG: 807 | Disposition: A | Discharge status: Home / Self Care | Payer: Medicaid Other | Attending: Family Medicine | Admitting: Family Medicine

## 2022-06-01 DIAGNOSIS — O99820 Streptococcus B carrier state complicating pregnancy: Secondary | ICD-10-CM | POA: Diagnosis not present

## 2022-06-01 DIAGNOSIS — O26893 Other specified pregnancy related conditions, third trimester: Secondary | ICD-10-CM | POA: Diagnosis not present

## 2022-06-01 DIAGNOSIS — O210 Mild hyperemesis gravidarum: Secondary | ICD-10-CM

## 2022-06-01 DIAGNOSIS — Z3A39 39 weeks gestation of pregnancy: Secondary | ICD-10-CM

## 2022-06-01 DIAGNOSIS — O26899 Other specified pregnancy related conditions, unspecified trimester: Secondary | ICD-10-CM

## 2022-06-01 DIAGNOSIS — O471 False labor at or after 37 completed weeks of gestation: Secondary | ICD-10-CM | POA: Insufficient documentation

## 2022-06-01 DIAGNOSIS — Z3A40 40 weeks gestation of pregnancy: Secondary | ICD-10-CM | POA: Diagnosis not present

## 2022-06-01 DIAGNOSIS — O4292 Full-term premature rupture of membranes, unspecified as to length of time between rupture and onset of labor: Secondary | ICD-10-CM | POA: Diagnosis not present

## 2022-06-01 DIAGNOSIS — Z3480 Encounter for supervision of other normal pregnancy, unspecified trimester: Secondary | ICD-10-CM

## 2022-06-01 DIAGNOSIS — O99824 Streptococcus B carrier state complicating childbirth: Secondary | ICD-10-CM | POA: Diagnosis present

## 2022-06-01 DIAGNOSIS — Z6791 Unspecified blood type, Rh negative: Secondary | ICD-10-CM

## 2022-06-01 DIAGNOSIS — Z7982 Long term (current) use of aspirin: Secondary | ICD-10-CM

## 2022-06-01 DIAGNOSIS — O479 False labor, unspecified: Secondary | ICD-10-CM | POA: Diagnosis not present

## 2022-06-01 DIAGNOSIS — O9902 Anemia complicating childbirth: Secondary | ICD-10-CM | POA: Diagnosis present

## 2022-06-01 DIAGNOSIS — Z3490 Encounter for supervision of normal pregnancy, unspecified, unspecified trimester: Secondary | ICD-10-CM

## 2022-06-01 DIAGNOSIS — O4202 Full-term premature rupture of membranes, onset of labor within 24 hours of rupture: Secondary | ICD-10-CM | POA: Diagnosis not present

## 2022-06-01 DIAGNOSIS — B951 Streptococcus, group B, as the cause of diseases classified elsewhere: Secondary | ICD-10-CM | POA: Diagnosis present

## 2022-06-01 DIAGNOSIS — Z302 Encounter for sterilization: Secondary | ICD-10-CM

## 2022-06-01 LAB — CBC
HCT: 33.2 % — ABNORMAL LOW (ref 36.0–46.0)
Hemoglobin: 11 g/dL — ABNORMAL LOW (ref 12.0–15.0)
MCH: 26.7 pg (ref 26.0–34.0)
MCHC: 33.1 g/dL (ref 30.0–36.0)
MCV: 80.6 fL (ref 80.0–100.0)
Platelets: 334 K/uL (ref 150–400)
RBC: 4.12 MIL/uL (ref 3.87–5.11)
RDW: 15.5 % (ref 11.5–15.5)
WBC: 11.4 K/uL — ABNORMAL HIGH (ref 4.0–10.5)
nRBC: 0 % (ref 0.0–0.2)

## 2022-06-01 LAB — TYPE AND SCREEN
ABO/RH(D): O NEG
Antibody Screen: POSITIVE

## 2022-06-01 LAB — POCT FERN TEST: POCT Fern Test: POSITIVE

## 2022-06-01 MED ORDER — LACTATED RINGERS IV SOLN: 500.0000 mL | INTRAVENOUS | Status: DC | PRN

## 2022-06-01 MED ORDER — PHENYLEPHRINE 80 MCG/ML (10ML) SYRINGE FOR IV PUSH (FOR BLOOD PRESSURE SUPPORT): 80.0000 ug | PREFILLED_SYRINGE | INTRAVENOUS | Status: DC | PRN

## 2022-06-01 MED ORDER — DIPHENHYDRAMINE HCL 50 MG/ML IJ SOLN: 12.5000 mg | INTRAMUSCULAR | Status: DC | PRN

## 2022-06-01 MED ORDER — FENTANYL-BUPIVACAINE-NACL 0.5-0.125-0.9 MG/250ML-% EP SOLN: 12.0000 mL/h | EPIDURAL | Status: DC | PRN

## 2022-06-01 MED ORDER — EPHEDRINE 5 MG/ML INJ: 10.0000 mg | INTRAVENOUS | Status: DC | PRN

## 2022-06-01 MED ORDER — OXYCODONE-ACETAMINOPHEN 5-325 MG PO TABS: 2.0000 | ORAL_TABLET | ORAL | Status: DC | PRN

## 2022-06-01 MED ORDER — PHENYLEPHRINE 80 MCG/ML (10ML) SYRINGE FOR IV PUSH (FOR BLOOD PRESSURE SUPPORT)
80.0000 ug | PREFILLED_SYRINGE | INTRAVENOUS | Status: DC | PRN
  Filled 2022-06-01: qty 10

## 2022-06-01 MED ORDER — OXYTOCIN BOLUS FROM INFUSION
333.0000 mL | Freq: Once | INTRAVENOUS | Status: AC
  Administered 2022-06-02: 333 mL via INTRAVENOUS

## 2022-06-01 MED ORDER — ONDANSETRON HCL 4 MG/2ML IJ SOLN
4.0000 mg | Freq: Four times a day (QID) | INTRAMUSCULAR | Status: DC | PRN
  Administered 2022-06-01 – 2022-06-02 (×2): 4 mg via INTRAVENOUS
  Filled 2022-06-01 (×2): qty 2

## 2022-06-01 MED ORDER — LACTATED RINGERS IV SOLN: INTRAVENOUS | Status: DC

## 2022-06-01 MED ORDER — TERBUTALINE SULFATE 1 MG/ML IJ SOLN: 0.2500 mg | Freq: Once | INTRAMUSCULAR | Status: DC | PRN

## 2022-06-01 MED ORDER — MORPHINE SULFATE (PF) 4 MG/ML IV SOLN
2.0000 mg | Freq: Once | INTRAVENOUS | Status: AC
  Administered 2022-06-01: 2 mg via INTRAMUSCULAR
  Filled 2022-06-01: qty 1

## 2022-06-01 MED ORDER — ACETAMINOPHEN 325 MG PO TABS
650.0000 mg | ORAL_TABLET | ORAL | Status: DC | PRN
  Administered 2022-06-02: 650 mg via ORAL
  Filled 2022-06-01: qty 2

## 2022-06-01 MED ORDER — OXYTOCIN-SODIUM CHLORIDE 30-0.9 UT/500ML-% IV SOLN
2.5000 [IU]/h | INTRAVENOUS | Status: DC
  Filled 2022-06-01: qty 500

## 2022-06-01 MED ORDER — OXYCODONE-ACETAMINOPHEN 5-325 MG PO TABS: 1.0000 | ORAL_TABLET | ORAL | Status: DC | PRN

## 2022-06-01 MED ORDER — LIDOCAINE HCL (PF) 1 % IJ SOLN
INTRAMUSCULAR | Status: DC | PRN
  Administered 2022-06-01: 8 mL via EPIDURAL

## 2022-06-01 MED ORDER — FENTANYL CITRATE (PF) 100 MCG/2ML IJ SOLN
100.0000 ug | INTRAMUSCULAR | Status: DC | PRN
  Administered 2022-06-01: 100 ug via INTRAVENOUS
  Filled 2022-06-01: qty 2

## 2022-06-01 MED ORDER — PENICILLIN G POT IN DEXTROSE 60000 UNIT/ML IV SOLN
3.0000 10*6.[IU] | INTRAVENOUS | Status: DC
  Administered 2022-06-02 (×4): 3 10*6.[IU] via INTRAVENOUS
  Filled 2022-06-01 (×7): qty 50

## 2022-06-01 MED ORDER — SOD CITRATE-CITRIC ACID 500-334 MG/5ML PO SOLN
30.0000 mL | ORAL | Status: DC | PRN
  Administered 2022-06-02: 30 mL via ORAL
  Filled 2022-06-01: qty 30

## 2022-06-01 MED ORDER — LIDOCAINE HCL (PF) 1 % IJ SOLN: 30.0000 mL | INTRAMUSCULAR | Status: DC | PRN

## 2022-06-01 MED ORDER — FENTANYL CITRATE (PF) 100 MCG/2ML IJ SOLN
INTRAMUSCULAR | Status: AC
  Filled 2022-06-01: qty 2

## 2022-06-01 MED ORDER — LACTATED RINGERS IV SOLN: 500.0000 mL | Freq: Once | INTRAVENOUS | Status: DC

## 2022-06-01 MED ORDER — FENTANYL-BUPIVACAINE-NACL 0.5-0.125-0.9 MG/250ML-% EP SOLN
12.0000 mL/h | EPIDURAL | Status: DC | PRN
  Administered 2022-06-01 – 2022-06-02 (×2): 12 mL/h via EPIDURAL
  Filled 2022-06-01 (×2): qty 250

## 2022-06-01 MED ORDER — PENICILLIN G POTASSIUM 5000000 UNITS IJ SOLR
5.0000 10*6.[IU] | Freq: Once | INTRAMUSCULAR | Status: AC
  Administered 2022-06-01: 5 10*6.[IU] via INTRAVENOUS
  Filled 2022-06-01 (×2): qty 5

## 2022-06-01 NOTE — Discharge Instructions (Signed)
Things to Try After 37 weeks to Encourage Labor/Get Ready for Labor:    Try the Miles Circuit at www.milescircuit.com daily to improve baby's position and encourage the onset of labor.  Walk a little and rest a little every day.  Change positions often.  Cervical Ripening: May try one or both Red Raspberry Leaf capsules or tea:  two 300mg or 400mg tablets with each meal, 2-3 times a day, or 1-3 cups of tea daily  Potential Side Effects Of Raspberry Leaf:  Most women do not experience any side effects from drinking raspberry leaf tea. However, nausea and loose stools are possible   Evening Primrose Oil capsules: take 1 capsule by mouth and place one capsule in the vagina every night.    Some of the potential side effects:  Upset stomach  Loose stools or diarrhea  Headaches  Nausea  Sex can also help the cervix ripen and encourage labor onset.   

## 2022-06-01 NOTE — H&P (Signed)
Patricia Horton is a 26 y.o. female G3P0020 with IUP at [redacted]w[redacted]d by  presenting for .  She reports positive fetal movement. She denies leakage of fluid or vaginal bleeding.  Prenatal History/Complications: PNC at [facility] Pregnancy complications:  - Past Medical History: Past Medical History:  Diagnosis Date   Asthma    Migraine with aura 2014   NO ESTROGEN   Seizures (HCC)    last seizure 2017    Past Surgical History: Past Surgical History:  Procedure Laterality Date   DILATION AND EVACUATION N/A 10/01/2020   Procedure: DILATATION AND EVACUATION;  Surgeon: Conan Bowens, MD;  Location: [facility];  Service: Gynecology;  Laterality: N/A;   NO PAST SURGERIES      Obstetrical History: OB History     Gravida  3   Para  0   Term  0   Preterm  0   AB  2   Living  0      SAB  2   IAB  0   Ectopic  0   Multiple  0   Live Births  0            Social History: Social History   Socioeconomic History   Marital status: Single    Spouse name: Not on file   Number of children: Not on file   Years of education: Not on file   Highest education level: Not on file  Occupational History   Not on file  Tobacco Use   Smoking status: Never   Smokeless tobacco: Never  Vaping Use   Vaping Use: Never used  Substance and Sexual Activity   Alcohol use: Not Currently    Comment: occ   Drug use: No   Sexual activity: Not Currently    Birth control/protection: None  Other Topics Concern   Not on file  Social History Narrative   Not on file   Social Determinants of Health   Financial Resource Strain: Low Risk  (03/11/2022)   Overall Financial Resource Strain (CARDIA)    Difficulty of Paying Living Expenses: Not hard at all  Food Insecurity: No Food Insecurity (03/11/2022)   Hunger Vital Sign    Worried About Running Out of Food in the Last Year: Never true    Ran Out of Food in the Last Year: Never true  Transportation Needs: No  Transportation Needs (03/11/2022)   PRAPARE - Administrator, Civil Service (Medical): No    Lack of Transportation (Non-Medical): No  Physical Activity: Sufficiently Active (03/11/2022)   Exercise Vital Sign    Days of Exercise per Week: 5 days    Minutes of Exercise per Session: 90 min  Stress: Stress Concern Present (03/11/2022)   [employer] of Occupational Health - Occupational Stress Questionnaire    Feeling of Stress : To some extent  Social Connections: Moderately Isolated (03/11/2022)   Social Connection and Isolation Panel [NHANES]    Frequency of Communication with Friends and Family: More than three times a week    Frequency of Social Gatherings with Friends and Family: Twice a week    Attends Religious Services: More than 4 times per year    Active Member of [employer] or Organizations: No    Attends Banker Meetings: Never    Marital Status: Divorced    Family History: Family History  Problem Relation Age of Onset   Diabetes Mother    Stroke Mother    Hypertension  Mother    Healthy Father    Heart disease Paternal Aunt    Cancer Paternal Grandmother        breast   Heart disease Paternal Grandmother     Allergies: No Known Allergies  Medications Prior to Admission  Medication Sig Dispense Refill Last Dose   Acetaminophen (TYLENOL PO) Take by mouth.   Past Week   aspirin 81 MG EC tablet Take 1 tablet (81 mg total) by mouth daily. Swallow whole. 90 tablet 3 06/01/2022   cyclobenzaprine (FLEXERIL) 10 MG tablet Take 1 tablet (10 mg total) by mouth every 8 (eight) hours as needed for muscle spasms. 30 tablet 1 06/01/2022   famotidine (PEPCID) 40 MG tablet Take 1 tablet (40 mg total) by mouth daily. 30 tablet 6 06/01/2022   metoCLOPramide (REGLAN) 10 MG tablet Take 1 tablet (10 mg total) by mouth every 6 (six) hours. 30 tablet 2 06/01/2022   ondansetron (ZOFRAN ODT) 4 MG disintegrating tablet Take 1 tablet (4 mg total) by mouth every 6 (six) hours as  needed for nausea or vomiting. 30 tablet 3 06/01/2022   Prenatal MV & Min w/FA-DHA (PRENATAL ADULT GUMMY/DHA/FA PO) Take by mouth. Takes 2 daily   06/01/2022   promethazine (PHENERGAN) 25 MG tablet Take 1 tablet (25 mg total) by mouth every 6 (six) hours as needed for nausea or vomiting. 30 tablet 1 06/01/2022   scopolamine (TRANSDERM-SCOP) 1 MG/3DAYS Place 1 patch (1.5 mg total) onto the skin every 3 (three) days. 10 patch 12     Review of Systems   Constitutional: Negative for fever and chills Eyes: Negative for visual disturbances Respiratory: Negative for shortness of breath, dyspnea Cardiovascular: Negative for chest pain or palpitations  Gastrointestinal: Negative for vomiting, diarrhea and constipation.  POSITIVE for abdominal pain (contractions) Genitourinary: Negative for dysuria and urgency Musculoskeletal: Negative for back pain, joint pain, myalgias  Neurological: Negative for dizziness and headaches  Blood pressure 132/90, pulse 74, temperature 98.1 F (36.7 C), temperature source Oral, resp. rate 20, last menstrual period 07/30/2021. General appearance: alert, cooperative, and no distress Lungs: normal respiratory effort Heart: regular rate and rhythm Abdomen: soft, non-tender; bowel sounds normal Extremities: Homans sign is negative, no sign of DVT DTR's 2+ Presentation: cephalic Fetal monitoring   150 bpm, mod var, present acel, occasional variables Uterine activity  patient is contracting every 2-3 minutes per patient report Dilation: 1 Effacement (%): 90 Station: -2 Exam by:: Felipa Furnace.RN   Prenatal labs: ABO, Rh: O/Negative/-- (01/25 1002) Antibody: Positive, See Final Results (04/12 0938) Rubella: 3.30 (01/25 1002) RPR: Non Reactive (04/12 0938)  HBsAg: Negative (01/25 1002)  HIV: Non Reactive (04/12 0938)  GBS: Positive/-- (06/07 1400)  1 hr Glucola passed Genetic screening  normal Anatomy US normal  Prenatal Transfer Tool  Maternal Diabetes:  No Genetic Screening: Normal Maternal Ultrasounds/Referrals: Normal Fetal Ultrasounds or other Referrals:  None Maternal Substance Abuse:  No Significant Maternal Medications:  None Significant Maternal Lab Results: Group B Strep positive  Results for orders placed or performed during the hospital encounter of 06/01/22 (from the past 24 hour(s))  POCT fern test   Collection Time: 06/01/22  7:54 PM  Result Value Ref Range   POCT Fern Test Positive = ruptured amniotic membanes     Assessment: Patricia Horton is a 26 y.o. G3P0020 with an IUP at [redacted]w[redacted]d presenting for SROM at 1900  Plan: #Labor: expectant management #Pain:  Per request #FWB Cat 1 #ID: GBS: post #MOF:  breast #  MOC: UNsure #Circ: NA    Marylene Land 06/01/2022, 9:30 PM

## 2022-06-01 NOTE — MAU Note (Signed)
Patricia Horton is a 26 y.o. at [redacted]w[redacted]d here in MAU reporting: Increasing ctx since this afternoon. Had big gush of fluid about 30 minutes ago. Was 1cm when checked earlier today.  LMP:  Onset of complaint: 30 min Pain score: 10/10 Vitals:   06/01/22 1952 06/01/22 1953  BP: 132/90   Pulse: 74   Resp:  20  Temp: 98.1 F (36.7 C)      FHT:165  Lab orders placed from triage:

## 2022-06-01 NOTE — Anesthesia Procedure Notes (Signed)
Epidural Patient location during procedure: OB Start time: 06/01/2022 10:21 PM End time: 06/01/2022 10:24 PM  Staffing Anesthesiologist: Bethena Midget, MD  Preanesthetic Checklist Completed: patient identified, IV checked, site marked, risks and benefits discussed, surgical consent, monitors and equipment checked, pre-op evaluation and timeout performed  Epidural Patient position: sitting Prep: DuraPrep and site prepped and draped Patient monitoring: continuous pulse ox and blood pressure Approach: midline Location: L3-L4 Injection technique: LOR air  Needle:  Needle type: Tuohy  Needle gauge: 17 G Needle length: 9 cm and 9 Needle insertion depth: 9 cm Catheter type: closed end flexible Catheter size: 19 Gauge Catheter at skin depth: 14 cm Test dose: negative  Assessment Events: blood not aspirated, injection not painful, no injection resistance, no paresthesia and negative IV test

## 2022-06-01 NOTE — MAU Note (Signed)
.  Patricia Horton is a 26 y.o. at [redacted]w[redacted]d here in MAU reporting: ctx that started last night and have since gotten worse. Lower abdominal pain (10/10) and lower back pain (10/10). Denies VB or LOF. Reports good FM.   Onset of complaint: last night Vitals:   06/01/22 1117 06/01/22 1118  BP: 132/90 132/90  Pulse: 87 76  Resp:  18  Temp:  98.7 F (37.1 C)  SpO2: 99% 100%     FHT:155 Lab orders placed from triage:  mau labor

## 2022-06-01 NOTE — MAU Provider Note (Signed)
Event Date/Time   First Provider Initiated Contact with Patient 06/01/22 1315     S: [name] is a 26 y.o. G3P0020 at [redacted]w[redacted]d  who presents to MAU today complaining contractions q 3-4 minutes since last night. She denies vaginal bleeding. She denies LOF. She reports normal fetal movement.    Began as a Agricultural consultant.   O: BP 129/88   Pulse 81   Temp 98.7 F (37.1 C) (Oral)   Resp 18   Ht 5\' 4"  (1.626 m)   Wt 95.5 kg   LMP 07/30/2021   SpO2 100%   BMI 36.15 kg/m  GENERAL: Well-developed, well-nourished female in no acute distress.  HEAD: Normocephalic, atraumatic.  CHEST: Normal effort of breathing, regular heart rate ABDOMEN: Soft, nontender, gravid  Cervical exam:  Dilation: 1 Effacement (%): 90 Station: -3 Presentation: Vertex Exam by:: [email or web address], RN   Fetal Monitoring: Baseline: 155 Variability: Moderate  Accelerations: Present 15x15  Decelerations: none Contractions: every 2-4  CNM at bedside to discuss the option of therapeutic rest, if patient desires. Also discussed Labor readiness, and at home comfort measures for Prodromal labor with patient and family. Through shared decision making, CNM and Patient agreed to therapeutic rest and possibility of discharge based on cervical exam.     A: SIUP at [redacted]w[redacted]d  False labor Pain improved to 7/10 with therapeutic rest.  Cervix unchanged.  Plan to discharge home.   P: 1. False labor   2. [redacted] weeks gestation of pregnancy    - Discussed that this is early labor, and unpredictability of how long it may last. Discussed comfort measures and Reviewed labor readiness with patient including the [redacted]w[redacted]d, and warm bath/showers.  - Patient has an ROB appt on Wednesday. Encouraged to keep visit, and plan for IOL if desired.  - FHT: Cat I upon discharge.  - Labor precautions reviewed.  - Patient discharged home in stable condition and May return to MAU as needed.   Wednesday, CNM 06/01/2022  1:46 PM

## 2022-06-01 NOTE — Anesthesia Preprocedure Evaluation (Signed)
Anesthesia Evaluation  Patient identified by MRN, date of birth, ID band Patient awake    Reviewed: Allergy & Precautions, H&P , NPO status , Patient's Chart, lab work & pertinent test results, reviewed documented beta blocker date and time   Airway Mallampati: I  TM Distance: >3 FB Neck ROM: full    Dental no notable dental hx. (+) Teeth Intact, Dental Advisory Given   Pulmonary neg pulmonary ROS,    Pulmonary exam normal breath sounds clear to auscultation       Cardiovascular negative cardio ROS Normal cardiovascular exam Rhythm:regular Rate:Normal     Neuro/Psych  Headaches, Seizures -, Well Controlled,  negative psych ROS   GI/Hepatic negative GI ROS, Neg liver ROS,   Endo/Other  negative endocrine ROS  Renal/GU negative Renal ROS  negative genitourinary   Musculoskeletal   Abdominal   Peds  Hematology  (+) Blood dyscrasia, Sickle cell trait ,   Anesthesia Other Findings   Reproductive/Obstetrics (+) Pregnancy                             Anesthesia Physical Anesthesia Plan  ASA: 2  Anesthesia Plan: Epidural   Post-op Pain Management:    Induction: Intravenous  PONV Risk Score and Plan: 2 and Treatment may vary due to age or medical condition  Airway Management Planned: Natural Airway  Additional Equipment: None  Intra-op Plan:   Post-operative Plan:   Informed Consent: I have reviewed the patients History and Physical, chart, labs and discussed the procedure including the risks, benefits and alternatives for the proposed anesthesia with the patient or authorized representative who has indicated his/her understanding and acceptance.       Plan Discussed with: Anesthesiologist  Anesthesia Plan Comments:         Anesthesia Quick Evaluation

## 2022-06-01 NOTE — MAU Note (Signed)
I have communicated with Dorathy Daft, CNM and reviewed vital signs:  Vitals:   06/01/22 1130 06/01/22 1350  BP: 129/88 126/83  Pulse: 81 64  Resp:    Temp:    SpO2: 100%     Vaginal exam:  Dilation: 1 Effacement (%): 90 Station: -3 Presentation: Vertex Exam by:: Georgina Snell, RN,   Also reviewed contraction pattern and that non-stress test is reactive.  It has been documented that patient is contracting every irregularly with minimal cervical change over 3 hours not indicating active labor.  Patient denies any other complaints.  Based on this report provider has given order for discharge.  A discharge order and diagnosis entered by a provider.   Labor discharge instructions reviewed with patient. Patient verbalized understanding on when to return to the hospital.

## 2022-06-02 ENCOUNTER — Encounter (HOSPITAL_COMMUNITY): Payer: Self-pay | Admitting: Obstetrics and Gynecology

## 2022-06-02 DIAGNOSIS — Z3A39 39 weeks gestation of pregnancy: Secondary | ICD-10-CM | POA: Diagnosis not present

## 2022-06-02 DIAGNOSIS — O4202 Full-term premature rupture of membranes, onset of labor within 24 hours of rupture: Secondary | ICD-10-CM | POA: Diagnosis not present

## 2022-06-02 DIAGNOSIS — O99820 Streptococcus B carrier state complicating pregnancy: Secondary | ICD-10-CM | POA: Diagnosis not present

## 2022-06-02 LAB — RPR: RPR Ser Ql: NONREACTIVE

## 2022-06-02 MED ORDER — OXYCODONE HCL 5 MG PO TABS: 5.0000 mg | ORAL_TABLET | ORAL | Status: DC | PRN

## 2022-06-02 MED ORDER — ACETAMINOPHEN 325 MG PO TABS: 650.0000 mg | ORAL_TABLET | ORAL | Status: DC | PRN

## 2022-06-02 MED ORDER — BUPIVACAINE HCL (PF) 0.25 % IJ SOLN
INTRAMUSCULAR | Status: DC | PRN
  Administered 2022-06-02: 8 mL via EPIDURAL

## 2022-06-02 MED ORDER — OXYTOCIN-SODIUM CHLORIDE 30-0.9 UT/500ML-% IV SOLN
1.0000 m[IU]/min | INTRAVENOUS | Status: DC
  Administered 2022-06-02: 2 m[IU]/min via INTRAVENOUS

## 2022-06-02 MED ORDER — ONDANSETRON HCL 4 MG PO TABS: 4.0000 mg | ORAL_TABLET | ORAL | Status: DC | PRN

## 2022-06-02 MED ORDER — FAMOTIDINE IN NACL 20-0.9 MG/50ML-% IV SOLN
20.0000 mg | Freq: Once | INTRAVENOUS | Status: AC
Start: 2022-06-02 — End: 2022-06-02
  Administered 2022-06-02: 20 mg via INTRAVENOUS
  Filled 2022-06-02: qty 50

## 2022-06-02 MED ORDER — FAMOTIDINE IN NACL 20-0.9 MG/50ML-% IV SOLN
20.0000 mg | Freq: Once | INTRAVENOUS | Status: DC
Start: 2022-06-02 — End: 2022-06-02

## 2022-06-02 MED ORDER — WITCH HAZEL-GLYCERIN EX PADS: 1.0000 | MEDICATED_PAD | CUTANEOUS | Status: DC | PRN

## 2022-06-02 MED ORDER — DIPHENHYDRAMINE HCL 50 MG/ML IJ SOLN
25.0000 mg | Freq: Once | INTRAMUSCULAR | Status: AC
  Administered 2022-06-02: 25 mg via INTRAVENOUS
  Filled 2022-06-02: qty 1

## 2022-06-02 MED ORDER — BENZOCAINE-MENTHOL 20-0.5 % EX AERO: 1.0000 | INHALATION_SPRAY | CUTANEOUS | Status: DC | PRN

## 2022-06-02 MED ORDER — PRENATAL MULTIVITAMIN CH
1.0000 | ORAL_TABLET | Freq: Every day | ORAL | Status: DC
  Administered 2022-06-03: 1 via ORAL
  Filled 2022-06-02: qty 1

## 2022-06-02 MED ORDER — COCONUT OIL OIL: 1.0000 | TOPICAL_OIL | Status: DC | PRN

## 2022-06-02 MED ORDER — DIBUCAINE (PERIANAL) 1 % EX OINT: 1.0000 | TOPICAL_OINTMENT | CUTANEOUS | Status: DC | PRN

## 2022-06-02 MED ORDER — TERBUTALINE SULFATE 1 MG/ML IJ SOLN: 0.2500 mg | Freq: Once | INTRAMUSCULAR | Status: DC | PRN

## 2022-06-02 MED ORDER — DIPHENHYDRAMINE HCL 25 MG PO CAPS: 25.0000 mg | ORAL_CAPSULE | Freq: Four times a day (QID) | ORAL | Status: DC | PRN

## 2022-06-02 MED ORDER — FENTANYL CITRATE (PF) 100 MCG/2ML IJ SOLN
INTRAMUSCULAR | Status: DC | PRN
Start: 2022-06-02 — End: 2022-06-02
  Administered 2022-06-02: 100 ug via EPIDURAL

## 2022-06-02 MED ORDER — TETANUS-DIPHTH-ACELL PERTUSSIS 5-2.5-18.5 LF-MCG/0.5 IM SUSY: 0.5000 mL | PREFILLED_SYRINGE | Freq: Once | INTRAMUSCULAR | Status: DC

## 2022-06-02 MED ORDER — OXYCODONE HCL 5 MG PO TABS: 10.0000 mg | ORAL_TABLET | ORAL | Status: DC | PRN

## 2022-06-02 MED ORDER — SIMETHICONE 80 MG PO CHEW: 80.0000 mg | CHEWABLE_TABLET | ORAL | Status: DC | PRN

## 2022-06-02 MED ORDER — SENNOSIDES-DOCUSATE SODIUM 8.6-50 MG PO TABS
2.0000 | ORAL_TABLET | Freq: Every day | ORAL | Status: DC
  Administered 2022-06-03 – 2022-06-04 (×2): 2 via ORAL
  Filled 2022-06-02 (×2): qty 2

## 2022-06-02 MED ORDER — ONDANSETRON HCL 4 MG/2ML IJ SOLN: 4.0000 mg | INTRAMUSCULAR | Status: DC | PRN

## 2022-06-02 MED ORDER — ZOLPIDEM TARTRATE 5 MG PO TABS: 5.0000 mg | ORAL_TABLET | Freq: Every evening | ORAL | Status: DC | PRN

## 2022-06-02 MED ORDER — IBUPROFEN 600 MG PO TABS
600.0000 mg | ORAL_TABLET | Freq: Four times a day (QID) | ORAL | Status: DC
  Administered 2022-06-02 – 2022-06-04 (×7): 600 mg via ORAL
  Filled 2022-06-02 (×7): qty 1

## 2022-06-02 NOTE — Progress Notes (Signed)
   LARENDA REEDY is a 26 y.o. G3P0020 at [redacted]w[redacted]d  admitted for rupture of membranes  Subjective: Patient resting, feels some pelvic pressure  Objective: Vitals:   06/02/22 0100 06/02/22 0130 06/02/22 0200 06/02/22 0230  BP: 111/73 117/63 (!) 125/57 (!) 121/97  Pulse: 83 81 (!) 148 (!) 111  Resp: 18 18 18 18   Temp: 99 F (37.2 C)     TempSrc: Axillary     SpO2:       No intake/output data recorded.  FHT:  FHR: 140 bpm, variability: moderate,  accelerations:  Present,  decelerations:  Absent UC:   regular, every 1-2 minutes SVE:   Dilation: 2 Effacement (%): 90 Station: -2 Exam by:: [email or web address], CNM Pitocin @ 0 mu/min  Labs: Lab Results  Component Value Date   WBC 11.4 (H) 06/01/2022   HGB 11.0 (L) 06/01/2022   HCT 33.2 (L) 06/01/2022   MCV 80.6 06/01/2022   PLT 334 06/01/2022    Assessment / Plan: Defer exam for now due to SROM, check at 5 am   Labor:  presumed  Fetal Wellbeing:  Category I Pain Control:  Epidural Anticipated MOD:  NSVD  08/02/2022 Briza Bark 06/02/2022, 2:35 AM

## 2022-06-02 NOTE — Lactation Note (Addendum)
This note was copied from a baby's chart. Lactation Consultation Note  Patient Name: Girl Vika Buske [id number] Date: 06/02/2022 Reason for consult: Initial assessment;Mother's request;Primapara;1st time breastfeeding;Term;Breastfeeding assistance;Other (Comment) (Flexeril 10 mg q 8hrs)  Flexeril ( L3 Hale book)  Age:27 hours  LC assisted latching infant in football hold with signs of milk transfer. Infant still feeding at the end of the visit.  LC to follow up with Mom and baby once on the floor.   Maternal Data Has patient been taught Hand Expression?: Yes  Feeding Mother's Current Feeding Choice: Breast Milk  LATCH Score Latch: Repeated attempts needed to sustain latch, nipple held in mouth throughout feeding, stimulation needed to elicit sucking reflex.  Audible Swallowing: A few with stimulation  Type of Nipple: Everted at rest and after stimulation  Comfort (Breast/Nipple): Soft / non-tender  Hold (Positioning): Assistance needed to correctly position infant at breast and maintain latch.  LATCH Score: 7   Lactation Tools Discussed/Used    Interventions Interventions: Breast feeding basics reviewed;Assisted with latch;Skin to skin;Breast massage;Hand express;Breast compression;Adjust position;Support pillows;Position options;Expressed milk;Education;Visual merchandiser education  Discharge    Consult Status Consult Status: Follow-up from L&D Date: 06/03/22    Zahlia Deshazer  Nicholson-Springer 06/02/2022, 4:51 PM

## 2022-06-02 NOTE — Discharge Summary (Signed)
Postpartum Discharge Summary     Patient Name: Patricia Horton DOB: 03-16-1996 MRN: [id number]  Date of admission: 06/01/2022 Delivery date:06/02/2022  Delivering provider: Truett Mainland  Date of discharge: 06/04/2022  Admitting diagnosis: Pregnancy [Z34.90] Intrauterine pregnancy: [redacted]w[redacted]d     Secondary diagnosis:  Principal Problem:   Pregnancy Active Problems:   Rh negative state in antepartum period   Request for sterilization   Positive testing for group B Streptococcus  Additional problems: none    Discharge diagnosis: Term Pregnancy Delivered                                              Post partum procedures: none Augmentation: AROM, Pitocin, and Cytotec Complications: None  Hospital course: Induction of Labor With Vaginal Delivery   26 y.o. yo G3P0020 at [redacted]w[redacted]d was admitted to the hospital 06/01/2022 for induction of labor.  Indication for induction: PROM.  Patient had an uncomplicated labor course as follows: Membrane Rupture Time/Date: 7:15 PM ,06/01/2022   Delivery Method:Vaginal, Spontaneous  Episiotomy: None  Lacerations:  None  Details of delivery can be found in separate delivery note.  Patient had a routine postpartum course. Patient is discharged home 06/04/22.  Newborn Data: Birth date:06/02/2022  Birth time:3:59 PM  Gender:Female  Living status:Living  Apgars:8 ,9  Weight:3330 g (7lb 5.5oz)  Magnesium Sulfate received: No BMZ received: No Rhophylac:N/A MMR:N/A T-DaP: declined Flu: N/A Transfusion:No  Physical exam  Vitals:   06/03/22 1300 06/03/22 2000 06/03/22 2030 06/04/22 0514  BP: 110/73 122/78 128/81 115/82  Pulse: 83 62 92 60  Resp:   17 16  Temp: 98.1 F (36.7 C) 98.2 F (36.8 C) 98.2 F (36.8 C) (!) 97.5 F (36.4 C)  TempSrc: Oral Oral Oral Oral  SpO2: 97%      General: alert and cooperative Lochia: appropriate Uterine Fundus: firm Incision: N/A DVT Evaluation: No evidence of DVT seen on physical exam. Labs: Lab Results   Component Value Date   WBC 17.8 (H) 06/03/2022   HGB 8.9 (L) 06/03/2022   HCT 27.0 (L) 06/03/2022   MCV 80.6 06/03/2022   PLT 271 06/03/2022      Latest Ref Rng & Units 01/14/2022    9:36 AM  CMP  Glucose 70 - 99 mg/dL 90   BUN 6 - 20 mg/dL 5   Creatinine 0.44 - 1.00 mg/dL 0.65   Sodium 135 - 145 mmol/L 134   Potassium 3.5 - 5.1 mmol/L 3.7   Chloride 98 - 111 mmol/L 107   CO2 22 - 32 mmol/L 19   Calcium 8.9 - 10.3 mg/dL 9.0   Total Protein 6.5 - 8.1 g/dL 6.2   Total Bilirubin 0.3 - 1.2 mg/dL 0.2   Alkaline Phos 38 - 126 U/L 38   AST 15 - 41 U/L 17   ALT 0 - 44 U/L 17    Edinburgh Score:    06/03/2022    6:38 PM  Edinburgh Postnatal Depression Scale Screening Tool  I have been able to laugh and see the funny side of things. 0  I have looked forward with enjoyment to things. 0  I have blamed myself unnecessarily when things went wrong. 0  I have been anxious or worried for no good reason. 0  I have felt scared or panicky for no good reason. 0  Things have been getting  on top of me. 1  I have been so unhappy that I have had difficulty sleeping. 0  I have felt sad or miserable. 1  I have been so unhappy that I have been crying. 0  The thought of harming myself has occurred to me. 0  Edinburgh Postnatal Depression Scale Total 2     After visit meds:  Allergies as of 06/04/2022   No Known Allergies      Medication List     STOP taking these medications    aspirin EC 81 MG tablet   cyclobenzaprine 10 MG tablet Commonly known as: FLEXERIL   famotidine 40 MG tablet Commonly known as: Pepcid   metoCLOPramide 10 MG tablet Commonly known as: REGLAN   ondansetron 4 MG disintegrating tablet Commonly known as: Zofran ODT   promethazine 25 MG tablet Commonly known as: PHENERGAN   scopolamine 1 MG/3DAYS Commonly known as: TRANSDERM-SCOP   TYLENOL PO       TAKE these medications    ibuprofen 600 MG tablet Commonly known as: ADVIL Take 1 tablet (600 mg  total) by mouth every 6 (six) hours as needed.   PRENATAL ADULT GUMMY/DHA/FA PO Take by mouth. Takes 2 daily         Discharge home in stable condition Infant Feeding: Bottle and Breast Infant Disposition:home with mother Discharge instruction: per After Visit Summary and Postpartum booklet. Activity: Advance as tolerated. Pelvic rest for 6 weeks.  Diet: routine diet Future Appointments: Future Appointments  Date Time Provider Green  07/15/2022 11:10 AM Myrtis Ser, CNM [facility]   Follow up Visit:   Please schedule this patient for a In person postpartum visit in 4 weeks with the following provider: Any provider. Additional Postpartum F/U: none   Low risk pregnancy complicated by:  na Delivery mode:  Vaginal, Spontaneous  Anticipated Birth Control:  Plans Interval BTL and IUD (msg for preop MD visit in 2wks to schedule interval BTL)   06/04/2022 Myrtis Ser, CNM

## 2022-06-02 NOTE — Lactation Note (Signed)
This note was copied from a baby's chart. Lactation Consultation Note  Patient Name: Patricia Horton [id number] Date: 06/02/2022 Reason for consult: Initial assessment;1st time breastfeeding;Term Age:27 hours Mom's current feeding choice is breast and formula feeding infant. Per mom, infant is latching well at the breast, most feedings are 10 to 20 minutes in length.  Mom is concern about her milk  supply, she expressed milk in her 3rd trimester but not seeing colostrum today, she did experience  breast changes in her pregnancy.  LC did not see a latch, infant was given 10 mls of formula prior to [facility] entering the room. LC explained infant's tummy size is small and encouraged mom to continue  latching  infant first at breast for every feeding to help stimulate and establish her milk supply. Mom will continue to breastfeed infant according to hunger cues, on demand, 8 to 12+ times within 24 hours, skin to skin. After offering infant her 1st breast and if infant is still cuing offer 2nd breast within the same feeding, if infant still showing hunger cues then supplement with formula. Mom requested to use DEBP, mom will pump 6 times within 24 hours, skin to skin on initial setting. Mom knows to call RN/LC if she has any further breastfeeding questions, concerns or needs latch assistance. Mom made aware of O/P services, breastfeeding support groups, community resources, and our phone # for post-discharge questions.      Maternal Data Has patient been taught Hand Expression?: Yes Does the patient have breastfeeding experience prior to this delivery?: No  Feeding Mother's Current Feeding Choice: Breast Milk and Formula Nipple Type: Slow - flow  LATCH Score                    Lactation Tools Discussed/Used Tools: Pump;Flanges Flange Size: 27 Breast pump type: Double-Electric Breast Pump Pump Education: Setup, frequency, and cleaning;Milk Storage Reason for Pumping: Mom's  request Pumping frequency: Mom will pump 6 X within 24 hours.  Interventions Interventions: Breast feeding basics reviewed;Assisted with latch;Skin to skin;Breast massage;Breast compression;Adjust position;Support pillows;Position options;Education;LC Services brochure;DEBP  Discharge    Consult Status Consult Status: Follow-up Date: 06/03/22 Follow-up type: In-patient    Danelle Earthly 06/02/2022, 9:43 PM

## 2022-06-02 NOTE — Progress Notes (Signed)
Patient ID: Patricia Horton, female   DOB: 07/18/1996, 26 y.o.   MRN: [id number]  Feeling pressure  BP 120/85   Pulse 94   Temp 100.3 F (37.9 C) (Axillary)   Resp 16   LMP 07/30/2021   SpO2 100%   Dilation: 10 Dilation Complete Date: 06/02/22 Dilation Complete Time: 1430 Effacement (%): 100 Cervical Position: Middle Station: 0, Plus 1 Presentation: Vertex Exam by:: Johnathan Hausen RN  Baseline 150 Moderate variability No accel. Early and variable decels.  Will start pushing.  Levie Heritage, DO 06/02/2022 2:41 PM

## 2022-06-02 NOTE — Progress Notes (Signed)
Patient ID: Patricia Horton, female   DOB: 09/30/1996, 26 y.o.   MRN: [id number]  Comfortable with epidural  BP 113/70   Pulse 83   Temp 99.6 F (37.6 C) (Axillary)   Resp 18   LMP 07/30/2021   SpO2 100%   Cervix 9cm per nurse. Possibly mild swelling on anterior part.  Having nausea. Zofran given.  Continue low pitocin. Reposition for cervical dilation - patient sitting upright.  Anticipate vaginal delivery.  Levie Heritage, DO

## 2022-06-03 ENCOUNTER — Other Ambulatory Visit: Payer: Medicaid Other

## 2022-06-03 ENCOUNTER — Encounter: Payer: Medicaid Other | Admitting: Advanced Practice Midwife

## 2022-06-03 LAB — CBC
HCT: 27 % — ABNORMAL LOW (ref 36.0–46.0)
Hemoglobin: 8.9 g/dL — ABNORMAL LOW (ref 12.0–15.0)
MCH: 26.6 pg (ref 26.0–34.0)
MCHC: 33 g/dL (ref 30.0–36.0)
MCV: 80.6 fL (ref 80.0–100.0)
Platelets: 271 10*3/uL (ref 150–400)
RBC: 3.35 MIL/uL — ABNORMAL LOW (ref 3.87–5.11)
RDW: 15.6 % — ABNORMAL HIGH (ref 11.5–15.5)
WBC: 17.8 10*3/uL — ABNORMAL HIGH (ref 4.0–10.5)
nRBC: 0 % (ref 0.0–0.2)

## 2022-06-03 MED ORDER — SODIUM CHLORIDE 0.9 % IV SOLN
500.0000 mg | Freq: Once | INTRAVENOUS | Status: AC
  Administered 2022-06-03: 500 mg via INTRAVENOUS
  Filled 2022-06-03: qty 500
  Filled 2022-06-03: qty 25

## 2022-06-03 MED ORDER — RHO D IMMUNE GLOBULIN 1500 UNIT/2ML IJ SOSY
300.0000 ug | PREFILLED_SYRINGE | Freq: Once | INTRAMUSCULAR | Status: AC
Start: 2022-06-03 — End: 2022-06-03
  Administered 2022-06-03: 300 ug via INTRAVENOUS
  Filled 2022-06-03: qty 2

## 2022-06-03 NOTE — Anesthesia Postprocedure Evaluation (Signed)
Anesthesia Post Note  Patient: Patricia Horton  Procedure(s) Performed: AN AD HOC LABOR EPIDURAL     Patient location during evaluation: Mother Baby Anesthesia Type: Epidural Level of consciousness: awake and alert Pain management: pain level controlled Vital Signs Assessment: post-procedure vital signs reviewed and stable Respiratory status: spontaneous breathing, nonlabored ventilation and respiratory function stable Cardiovascular status: stable Postop Assessment: no headache, no backache and epidural receding Anesthetic complications: no   No notable events documented.  Last Vitals:  Vitals:   06/03/22 0209 06/03/22 0529  BP: 116/64 111/70  Pulse: 78 81  Resp: 16 16  Temp: 36.7 C 36.9 C  SpO2: 98% 100%    Last Pain:  Vitals:   06/03/22 0529  TempSrc: Oral  PainSc:    Pain Goal: Patients Stated Pain Goal: 0 (06/02/22 0300)                 Salome Arnt

## 2022-06-03 NOTE — Progress Notes (Addendum)
POSTPARTUM PROGRESS NOTE  Post Partum Day 1 s/p SVD, GBS+ adequately treated  Subjective:  Patricia Horton is a 26 y.o. [id number] s/p SVD at [redacted]w[redacted]d.  No acute events overnight.  Pt denies problems with ambulating, voiding or po intake.  She denies nausea or vomiting.  Pain is well controlled on ibuprofen PRN.  She has had flatus. She has not had bowel movement.  Lochia about the same as normal menses, no reported clots.   Objective: Blood pressure 111/70, pulse 81, temperature 98.5 F (36.9 C), temperature source Oral, resp. rate 16, last menstrual period 07/30/2021, SpO2 100 %, unknown if currently breastfeeding.  Physical Exam:  General: alert, cooperative and no distress Chest: no respiratory distress Heart:regular rate, distal pulses intact Abdomen: soft, nontender,  Uterine Fundus: firm, appropriately tender DVT Evaluation: No calf swelling or tenderness, negative Homan's sign Extremities: No edema Skin: warm, dry  Recent Labs    06/01/22 2043 06/03/22 0511  HGB 11.0* 8.9*  HCT 33.2* 27.0*    Assessment/Plan: Patricia Horton is a 26 y.o. [id number] s/p SVD at [redacted]w[redacted]d   PPD#1 - Doing well, did have intrapartum temperature of 100.3 and leukocytosis to 17, continue to monitor Anemia: Hgb 8.9 from 11, will provide IV venofer Contraception: 6-week PP BTL Feeding: Breast/bottle Dispo: Plan for discharge tomorrow as infant will likely need to stay.    LOS: 2 days   Raylene Everts, MD 06/03/2022, 7:48 AM   GME ATTESTATION:  I saw and evaluated the patient. I agree with the findings and the plan of care as documented in the resident's note.  Overall doing well. IV venofer today. O negative, infant RH positive-- plan for RhoGAM prior to dc. Discharge home tomorrow (if infant ends up being able to go home today, mom would be stable for dc).   Leticia Penna, DO OB Fellow, Faculty [facility], Center for [facility] 06/03/2022 9:13 AM

## 2022-06-03 NOTE — Lactation Note (Signed)
This note was copied from a baby's chart. Lactation Consultation Note  Patient Name: Patricia Horton [id number] Date: 06/03/2022 Reason for consult: Follow-up assessment;Term;Primapara;1st time breastfeeding Age:27 hours   P1 mother whose infant is now 66 hours old.  This is a term baby at 40+0 weeks.  Mother's current feeding preference is breast.  However, she did request to begin pumping which was set up yesterday.  After reviewing flowsheets I was not able to determine mother's exact plan.  She has been primarily formula feeding.  Mother stated that she was confused with different options and did not know what she was "supposed to do."  I asked her what she would like to do and she replied, "Breast feed and pump."  Since this is her choice I suggested she refrain from giving formula and put baby "Patricia Horton" to the breast with feeding cues and call for assistance as needed.  Explained that she does not have to supplement with formula at all unless that is her choice.  Explained that she can pump after breast feeding if this is her desire.  Mother verbalized understanding now.  Offered to assist with latching since "Patricia Horton" was fussy.  Asked permission to remove clothing and feed her STS; mother receptive.  Reviewed hand expression; no drops noted.  Attempted to latch in the football hold, however, "Patricia Horton" was not interested.  Asked father to do STS and she fell asleep on his chest.  Mother planning to eat breakfast.  Feeding plan will include:  latch with every feed followed by pumping as mother desires.  Encouraged lots of STS, breast massage and hand expression.  Mother will feed back any expressed milk she obtains to baby.  Mother feels no discomfort with pumping.  Asked her to call if she has any pain so flange size can be assessed.  Suggested she call her RN/LC for assistance as needed.  RN updated.   Maternal Data Has patient been taught Hand Expression?: Yes Does the patient have  breastfeeding experience prior to this delivery?: No  Feeding Mother's Current Feeding Choice: Breast Milk Nipple Type: Slow - flow  LATCH Score Latch: Too sleepy or reluctant, no latch achieved, no sucking elicited.  Audible Swallowing: None  Type of Nipple: Everted at rest and after stimulation  Comfort (Breast/Nipple): Soft / non-tender  Hold (Positioning): Assistance needed to correctly position infant at breast and maintain latch.  LATCH Score: 5   Lactation Tools Discussed/Used Tools: Pump;Flanges Flange Size: 27 Breast pump type: Double-Electric Breast Pump;Manual Pump Education: Setup, frequency, and cleaning (Did not wish to review) Reason for Pumping: Mother's request Pumping frequency: Every three hours  Interventions Interventions: Breast feeding basics reviewed;Assisted with latch;Breast massage;Skin to skin;Hand express;Position options;Support pillows;Adjust position;Education  Discharge Pump: Personal (Mother unsure of brand)  Consult Status Consult Status: Follow-up Date: 06/04/22 Follow-up type: In-patient    Patricia Horton R Patricia Horton 06/03/2022, 10:27 AM

## 2022-06-04 LAB — RH IG WORKUP (INCLUDES ABO/RH)
Fetal Screen: NEGATIVE
Gestational Age(Wks): 40
Unit division: 0

## 2022-06-04 MED ORDER — IBUPROFEN 600 MG PO TABS: 600.0000 mg | ORAL_TABLET | Freq: Four times a day (QID) | ORAL | 0 refills | Status: DC | PRN

## 2022-06-04 NOTE — Lactation Note (Signed)
This note was copied from a baby's chart. Lactation Consultation Note  Patient Name: Patricia Horton [id number] Date: 06/04/2022 Reason for consult: Follow-up assessment Age:27 hours  P1, Reviewed hand expression with no drops express. Mother has been primarily formula feeding.  Encouraged mother to offer breast first and if formula feeding be sure to pump with DEBP to stimulate supply. Reviewed engorgement care and monitoring voids/stools.  Maternal Data Has patient been taught Hand Expression?: Yes  Feeding Mother's Current Feeding Choice: Breast Milk and Formula Nipple Type: Slow - flow  Interventions Interventions: Hand express;Education  Discharge Discharge Education: Engorgement and breast care;Warning signs for feeding baby  Consult Status Consult Status: Complete Date: 06/04/22    Dahlia Byes [facility] 06/04/2022, 10:46 AM

## 2022-06-10 ENCOUNTER — Telehealth (HOSPITAL_COMMUNITY): Payer: Self-pay | Admitting: *Deleted

## 2022-06-10 NOTE — Telephone Encounter (Signed)
Mom reports feeling good. No concerns about herself at this time. Mom is out of the house now and would like nurse to call back tomorrow for EPDS [facility] score=2) Mom reports baby is doing well. Feeding, peeing, and pooping without difficulty. Safe sleep reviewed. Mom reports no concerns about baby at present.  Duffy Rhody, RN 06-10-2022 at 1:36pm

## 2022-06-11 ENCOUNTER — Telehealth (HOSPITAL_COMMUNITY): Payer: Self-pay | Admitting: *Deleted

## 2022-06-11 NOTE — Telephone Encounter (Signed)
Left phone voicemail message.  Duffy Rhody, RN 06-11-2022 at 10:59am

## 2022-06-18 ENCOUNTER — Encounter: Payer: Medicaid Other | Admitting: Obstetrics & Gynecology

## 2022-06-25 ENCOUNTER — Encounter: Payer: Self-pay | Admitting: Advanced Practice Midwife

## 2022-06-25 ENCOUNTER — Ambulatory Visit (INDEPENDENT_AMBULATORY_CARE_PROVIDER_SITE_OTHER): Payer: Medicaid Other | Admitting: Advanced Practice Midwife

## 2022-06-25 MED ORDER — PHEXXI 1.8-1-0.4 % VA GEL: 1.0000 | Freq: Once | VAGINAL | 12 refills | Status: DC | PRN

## 2022-06-25 NOTE — Progress Notes (Signed)
Post Partum Visit Note   Chief Complaint:   Postpartum Care  History of Present Illness:   Patricia Horton is a 26 y.o. [id number] African American female being seen today for a postpartum visit. She is 4 weeks postpartum following a spontaneous vaginal delivery at 39.6 gestational weeks. IOL: Yes, for PROM. Anesthesia: epidural.  Laceration: none.  Complications: none. Inpatient contraception: no.   Pregnancy uncomplicated. Tobacco use: no. Substance use disorder: yes   . Last pap smear: 09/16/21 and results were NILM w/ HRHPV not done. Next pap smear due: 2025 No LMP recorded.  Postpartum course has been uncomplicated. Bleeding staining only. Bowel function is normal. Bladder function is normal. Urinary incontinence? No, fecal incontinence? No Patient is not sexually active. Last sexual activity: prior to birth.    Upstream - 06/25/22 1027       Pregnancy Intention Screening   Does the patient want to become pregnant in the next year? Ok Either Way    Does the patient's partner want to become pregnant in the next year? Ok Either Way    Would the patient like to discuss contraceptive options today? No      Contraception Wrap Up   Current Method Abstinence    End Method Abstinence    Contraception Counseling Provided No            The pregnancy intention screening data noted above was reviewed. Potential methods of contraception were discussed. The patient elected to proceed with Abstinence.  Edinburgh Postpartum Depression Screening: Negative  Edinburgh Postnatal Depression Scale - 06/25/22 1026       Edinburgh Postnatal Depression Scale:  In the Past 7 Days   I have been able to laugh and see the funny side of things. 1    I have looked forward with enjoyment to things. 0    I have blamed myself unnecessarily when things went wrong. 1    I have been anxious or worried for no good reason. 2    I have felt scared or panicky for no good reason. 1    Things have been getting  on top of me. 1    I have been so unhappy that I have had difficulty sleeping. 1    I have felt sad or miserable. 1    I have been so unhappy that I have been crying. 1    The thought of harming myself has occurred to me. 0    Edinburgh Postnatal Depression Scale Total 9            Baby's course has been uncomplicated. Baby is feeding by yes. Infant has a pediatrician/family doctor? Yes.  Childcare strategy if returning to work/school: yes.  Pt has material needs met for her and baby: Yes.   Review of Systems:   Pertinent items are noted in HPI Denies Abnormal vaginal discharge w/ itching/odor/irritation, headaches, visual changes, shortness of breath, chest pain, abdominal pain, severe nausea/vomiting, or problems with urination or bowel movements. Pertinent History Reviewed:  Reviewed past medical,surgical, obstetrical and family history.  Reviewed problem list, medications and allergies. OB History  Gravida Para Term Preterm AB Living  _0 0 2 1  SAB IAB Ectopic Multiple Live Births  2 0 0 0 1    # Outcome Date GA Lbr Len/2nd Weight Sex Delivery Anes PTL Lv  3 Term 06/02/22 [id number]:30 / 01:29 7 lb 5.5 oz (3.33 kg) F Vag-Spont EPI  LIV  2 SAB 10/01/20 153w3d  1 SAB            Physical Assessment:   Vitals:   06/25/22 1020  BP: 119/76  Pulse: 77  Weight: 196 lb (88.9 kg)  Height: 5' 4" (1.626 m)  Body mass index is 33.64 kg/m.  Objective:  Blood pressure 119/76, pulse 77, height 5' 4" (1.626 m), weight 196 lb (88.9 kg), not currently breastfeeding.  General:  alert, cooperative, and no distress   Breasts:  negative  Lungs: Normal respiratory effort  Heart:  regular rate and rhythm  Abdomen: soft, non-tender,    Vulva:  normal  Vagina: normal vagina  Cervix:  normal  Corpus: Well involuted  Adnexa:  not evaluated  Rectal Exam: no hemorrhoids          No results found for this or any previous visit (from the past 24 hour(s)).  Assessment & Plan:  1)  Postpartum exam 2) 4 wks s/p spontaneous vaginal delivery 3) breast & bottle feeding 4) Depression screening 5) Contraception management: phexxi and condoms  Essential components of care per ACOG recommendations:  1.  Mood and well being:  If positive depression screen, discussed and plan developed.  If using tobacco we discussed reduction/cessation and risk of relapse If current substance abuse, we discussed and referral to local resources was offered.   2. Infant care and feeding:  If breastfeeding, discussed returning to work, pumping, breastfeeding-associated pain, guidance regarding return to fertility while lactating if not using another method. If needed, patient was provided with a letter to be allowed to pump q 2-3hrs to support lactation in a private location with access to a refrigerator to store breastmilk.   Recommended that all caregivers be immunized for flu, pertussis and other preventable communicable diseases If pt does not have material needs met for her/baby, referred to local resources for help obtaining these.  3. Sexuality, contraception and birth spacing Provided guidance regarding sexuality, management of dyspareunia, and resumption of intercourse Discussed avoiding interpregnancy interval <49mhs and recommended birth spacing of 18 months  4. Sleep and fatigue Discussed coping options for fatigue and sleep disruption Encouraged family/partner/community support of 4 hrs of uninterrupted sleep to help with mood and fatigue  5. Physical recovery  If pt had a C/S, assessed incisional pain and providing guidance on normal vs prolonged recovery If pt had a laceration, perineal healing and pain reviewed.  If urinary or fecal incontinence, discussed management and referred to PT or uro/gyn if indicated  Patient is safe to resume physical activity. Discussed attainment of healthy weight.  6.  Chronic disease management Discussed pregnancy complications if any, and  their implications for future childbearing and long-term maternal health. Review recommendations for prevention of recurrent pregnancy complications, such as aspirin to reduce risk of preeclampsia not applicable. Pt had GDM: No. If yes, 2hr GTT scheduled: not applicable. Reviewed medications and non-pregnant dosing including consideration of whether pt is breastfeeding using a reliable resource such as LactMed: not applicable Referred for f/u w/ PCP or subspecialist providers as indicated: not applicable  7. Health maintenance Mammogram at [id number] earlier if indicated Pap smears as indicated  Meds:  Meds ordered this encounter  Medications   Lactic Ac-Citric Ac-Pot Bitart (PHEXXI) 1.8-1-0.4 % GEL    Sig: Place 1 applicator vaginally once as needed for up to 1 dose. Good for one hour    Dispense:  5 g    Refill:  12    Order Specific Question:   Supervising Provider  Answer:   Florian Buff [[id number]]    Follow-up: No follow-ups on file.   No orders of the defined types were placed in this encounter.      [place] for [employer], [place] [facility] 06/25/2022 10:59 AM

## 2022-07-15 ENCOUNTER — Ambulatory Visit: Payer: Medicaid Other | Admitting: Advanced Practice Midwife

## 2022-10-21 ENCOUNTER — Emergency Department (HOSPITAL_COMMUNITY): Payer: Medicaid Other

## 2022-10-21 ENCOUNTER — Encounter (HOSPITAL_COMMUNITY): Payer: Self-pay | Admitting: *Deleted

## 2022-10-21 ENCOUNTER — Emergency Department (HOSPITAL_COMMUNITY)
Admission: EM | Admit: 2022-10-21 | Discharge: 2022-10-21 | Disposition: A | Discharge status: Home / Self Care | Payer: Medicaid Other | Attending: Emergency Medicine | Admitting: Emergency Medicine

## 2022-10-21 ENCOUNTER — Other Ambulatory Visit: Payer: Self-pay

## 2022-10-21 DIAGNOSIS — R110 Nausea: Secondary | ICD-10-CM | POA: Insufficient documentation

## 2022-10-21 DIAGNOSIS — R1032 Left lower quadrant pain: Secondary | ICD-10-CM | POA: Diagnosis not present

## 2022-10-21 DIAGNOSIS — J45909 Unspecified asthma, uncomplicated: Secondary | ICD-10-CM | POA: Diagnosis not present

## 2022-10-21 DIAGNOSIS — R1030 Lower abdominal pain, unspecified: Secondary | ICD-10-CM | POA: Insufficient documentation

## 2022-10-21 DIAGNOSIS — R1031 Right lower quadrant pain: Secondary | ICD-10-CM | POA: Diagnosis not present

## 2022-10-21 LAB — URINALYSIS, ROUTINE W REFLEX MICROSCOPIC
Bilirubin Urine: NEGATIVE
Glucose, UA: NEGATIVE mg/dL
Hgb urine dipstick: NEGATIVE
Ketones, ur: NEGATIVE mg/dL
Leukocytes,Ua: NEGATIVE
Nitrite: NEGATIVE
Protein, ur: NEGATIVE mg/dL
Specific Gravity, Urine: 1.021 (ref 1.005–1.030)
pH: 6 (ref 5.0–8.0)

## 2022-10-21 LAB — COMPREHENSIVE METABOLIC PANEL
ALT: 19 U/L (ref 0–44)
AST: 18 U/L (ref 15–41)
Albumin: 3.9 g/dL (ref 3.5–5.0)
Alkaline Phosphatase: 57 U/L (ref 38–126)
Anion gap: 3 — ABNORMAL LOW (ref 5–15)
BUN: 14 mg/dL (ref 6–20)
CO2: 27 mmol/L (ref 22–32)
Calcium: 9 mg/dL (ref 8.9–10.3)
Chloride: 106 mmol/L (ref 98–111)
Creatinine, Ser: 0.82 mg/dL (ref 0.44–1.00)
GFR, Estimated: >60 mL/min (ref >60)
Glucose, Bld: 106 mg/dL — ABNORMAL HIGH (ref 70–99)
Potassium: 3.7 mmol/L (ref 3.5–5.1)
Sodium: 136 mmol/L (ref 135–145)
Total Bilirubin: 0.5 mg/dL (ref 0.3–1.2)
Total Protein: 7.3 g/dL (ref 6.5–8.1)

## 2022-10-21 LAB — CBC
HCT: 39.7 % (ref 36.0–46.0)
Hemoglobin: 12.7 g/dL (ref 12.0–15.0)
MCH: 26.5 pg (ref 26.0–34.0)
MCHC: 32 g/dL (ref 30.0–36.0)
MCV: 82.9 fL (ref 80.0–100.0)
Platelets: 300 10*3/uL (ref 150–400)
RBC: 4.79 MIL/uL (ref 3.87–5.11)
RDW: 14.6 % (ref 11.5–15.5)
WBC: 6 10*3/uL (ref 4.0–10.5)
nRBC: 0 % (ref 0.0–0.2)

## 2022-10-21 LAB — PREGNANCY, URINE: Preg Test, Ur: NEGATIVE

## 2022-10-21 LAB — LIPASE, BLOOD: Lipase: 33 U/L (ref 11–51)

## 2022-10-21 MED ORDER — IOHEXOL 300 MG/ML  SOLN
100.0000 mL | Freq: Once | INTRAMUSCULAR | Status: AC | PRN
  Administered 2022-10-21: 100 mL via INTRAVENOUS

## 2022-10-21 MED ORDER — IBUPROFEN 600 MG PO TABS: 600.0000 mg | ORAL_TABLET | Freq: Three times a day (TID) | ORAL | 0 refills | Status: DC

## 2022-10-21 MED ORDER — KETOROLAC TROMETHAMINE 30 MG/ML IJ SOLN
15.0000 mg | Freq: Once | INTRAMUSCULAR | Status: AC
  Administered 2022-10-21: 15 mg via INTRAVENOUS
  Filled 2022-10-21: qty 1

## 2022-10-21 NOTE — ED Provider Notes (Signed)
[facility] EMERGENCY DEPARTMENT Provider Note   CSN: [id number] Arrival date & time: 10/21/22  1048     History  Chief Complaint  Patient presents with   Abdominal Pain    Patricia Horton is a 26 y.o. female with a history including migraines, asthma, history of seizures who is currently 4 months postpartum presenting for evaluation of bilateral low abdominal pain which she describes as a soreness which is worsened with palpation and with movement such as when she picks up her infant daughter.  She states she had similar symptoms immediately postpartum which was just felt to be part of the post pregnancy healing.  However the symptoms returned several days ago after being completely resolved since the initial episode.  She endorses nausea, no vomiting, no diarrhea, fevers, also denies dysuria, abdominal distention.  Her appetite has been normal.  She is not breast-feeding.  She also denies vaginal discharge, denies risk for STDs.  She has taken Tylenol and ibuprofen with equivocal symptom relief.  The history is provided by the patient.       Home Medications Prior to Admission medications   Medication Sig Start Date End Date Taking? Authorizing Provider  ibuprofen (ADVIL) 600 MG tablet Take 1 tablet (600 mg total) by mouth 3 (three) times daily. 10/21/22  Yes Idol, Almyra Free, PA-C  Lactic Ac-Citric Ac-Pot Bitart (PHEXXI) 1.8-1-0.4 % GEL Place 1 applicator vaginally once as needed for up to 1 dose. Good for one hour 06/25/22   Christin Fudge, CNM  Prenatal MV & Min w/FA-DHA (PRENATAL ADULT GUMMY/DHA/FA PO) Take by mouth. Takes 2 daily    [provider]      Allergies    Patient has no known allergies.    Review of Systems   Review of Systems  Constitutional:  Negative for chills and fever.  HENT: Negative.    Eyes: Negative.   Respiratory:  Negative for chest tightness and shortness of breath.   Cardiovascular:  Negative for chest pain.  Gastrointestinal:   Positive for abdominal pain and nausea. Negative for constipation, diarrhea and vomiting.  Genitourinary: Negative.   Musculoskeletal:  Negative for arthralgias, joint swelling and neck pain.  Skin: Negative.  Negative for rash and wound.  Neurological:  Negative for dizziness, weakness, light-headedness, numbness and headaches.  Psychiatric/Behavioral: Negative.    All other systems reviewed and are negative.   Physical Exam Updated Vital Signs BP (!) 141/81 (BP Location: Left Arm)   Pulse 64   Temp 98.3 F (36.8 C) (Oral)   Resp 18   Ht 5' 4" (1.626 m)   Wt 90.2 kg   LMP 09/25/2022   SpO2 99%   Breastfeeding No   BMI 34.12 kg/m  Physical Exam Vitals and nursing note reviewed.  Constitutional:      Appearance: She is well-developed.  HENT:     Head: Normocephalic and atraumatic.  Eyes:     Conjunctiva/sclera: Conjunctivae normal.  Cardiovascular:     Rate and Rhythm: Normal rate and regular rhythm.     Heart sounds: Normal heart sounds.  Pulmonary:     Effort: Pulmonary effort is normal.     Breath sounds: Normal breath sounds. No wheezing.  Abdominal:     General: Bowel sounds are normal.     Palpations: Abdomen is soft.     Tenderness: There is abdominal tenderness in the right lower quadrant and left lower quadrant. There is no guarding or rebound. Negative signs include Murphy's sign and McBurney's sign.  Musculoskeletal:        General: Normal range of motion.     Cervical back: Normal range of motion.  Skin:    General: Skin is warm and dry.  Neurological:     Mental Status: She is alert.     ED Results / Procedures / Treatments   Labs (all labs ordered are listed, but only abnormal results are displayed) Labs Reviewed  COMPREHENSIVE METABOLIC PANEL - Abnormal; Notable for the following components:      Result Value   Glucose, Bld 106 (*)    Anion gap 3 (*)    All other components within normal limits  URINALYSIS, ROUTINE W REFLEX MICROSCOPIC -  Abnormal; Notable for the following components:   APPearance HAZY (*)    All other components within normal limits  LIPASE, BLOOD  CBC  PREGNANCY, URINE    EKG None  Radiology CT ABDOMEN PELVIS W CONTRAST  Result Date: 10/21/2022 CLINICAL DATA:  Bilateral lower abdominal pain for 3 days EXAM: CT ABDOMEN AND PELVIS WITH CONTRAST TECHNIQUE: Multidetector CT imaging of the abdomen and pelvis was performed using the standard protocol following bolus administration of intravenous contrast. RADIATION DOSE REDUCTION: This exam was performed according to the departmental dose-optimization program which includes automated exposure control, adjustment of the mA and/or kV according to patient size and/or use of iterative reconstruction technique. CONTRAST:  189m OMNIPAQUE IOHEXOL 300 MG/ML  SOLN COMPARISON:  01/14/2022 FINDINGS: Lower chest: No acute pleural or parenchymal lung disease. Hepatobiliary: Stable 1.3 cm right lobe liver hypodensity could reflect a cyst or hemangioma. Remainder of the liver is unremarkable. The gallbladder is normal. No biliary duct dilation. Pancreas: Unremarkable. No pancreatic ductal dilatation or surrounding inflammatory changes. Spleen: Normal in size without focal abnormality. Adrenals/Urinary Tract: Adrenal glands are unremarkable. Kidneys are normal, without renal calculi, focal lesion, or hydronephrosis. Bladder is unremarkable. Stomach/Bowel: No bowel obstruction or ileus. Normal appendix right lower quadrant. No bowel wall thickening or inflammatory change. Vascular/Lymphatic: No significant vascular findings are present. No enlarged abdominal or pelvic lymph nodes. Reproductive: Uterus is unremarkable. Normal follicle within the right ovary. No other ovarian masses. Other: No free fluid or free intraperitoneal gas. No abdominal wall hernia. Musculoskeletal: No acute or destructive bony lesions. Reconstructed images demonstrate no additional findings. IMPRESSION: 1. No  acute intra-abdominal or intrapelvic process. Normal appendix. Electronically Signed   By: MRanda NgoM.D.   On: 10/21/2022 14:14    Procedures Procedures    Medications Ordered in ED Medications  ketorolac (TORADOL) 30 MG/ML injection 15 mg (15 mg Intravenous Given 10/21/22 1339)  iohexol (OMNIPAQUE) 300 MG/ML solution 100 mL (100 mLs Intravenous Contrast Given 10/21/22 1400)    ED Course/ Medical Decision Making/ A&P                           Medical Decision Making Patient presenting with bilateral lower abdominal pain present for several days, nausea without emesis, afebrile.  Describes "soreness".  Labs obtained are reassuring, CT imaging is negative for acute intra-abdominal process, specifically this is not appendicitis, diverticulitis, intestinal obstruction.  Additional differential diagnosis including UTI, PID, musculoskeletal source.  With labs being benign, CT imaging also normal, I discussed with patient proceeding with pelvic ultrasound and pelvic exam to screen for STDs.  She  strongly doubts STD.  Defers further exam and imaging.  Advised follow-up care with her gynecologist and/your PCP if your symptoms persist, also advised return here for any  new or worsening symptoms.  Amount and/or Complexity of Data Reviewed Labs: ordered.    Details: Patient is not pregnant, she has a normal urinalysis, also normal c-Met, lipase and CBC. Radiology: ordered.    Details: CT abdomen pelvis negative for acute findings.  Risk Prescription drug management.           Final Clinical Impression(s) / ED Diagnoses Final diagnoses:  Lower abdominal pain    Rx / DC Orders ED Discharge Orders          Ordered    ibuprofen (ADVIL) 600 MG tablet  3 times daily        10/21/22 1505              Evalee Jefferson, Hershal Coria 10/21/22 1804    Milton Ferguson, MD 10/24/22 1009

## 2022-10-21 NOTE — ED Triage Notes (Signed)
Pt c/o lower abdominal pain x several days and nausea x couple weeks. Denies v/d, fever.

## 2022-10-21 NOTE — ED Notes (Signed)
Pt c/o lower abd pain, right and left side with intermittent nausea x 3 days. States has a four month old and this happened once before since baby has been born but pt stated dr "chalked it up to just having a baby". Pt denies diarrhea/vomiting. LNBm yesterday. Denies vag d/c. Nad. Color wnl.

## 2022-10-21 NOTE — Discharge Instructions (Addendum)
As discussed your labs and CT imaging today are in negative, showing no source for your symptoms.  This may be body wall pain, meaning muscle strain, however this could also be a gynecological problem although your CT scan is negative for any obvious source of your symptoms as is your blood work.  Plan follow-up with your primary provider or your gynecologist as discussed.  If you feel like your symptoms are getting worse in any way, you may return here, as discussed the neck step may need to be a pelvic ultrasound.  In the interim take the ibuprofen as needed for symptom relief.  You may also benefit from a heating pad applied to your lower abdomen as discussed.

## 2022-10-29 ENCOUNTER — Encounter: Payer: Self-pay | Admitting: Obstetrics & Gynecology

## 2022-10-29 ENCOUNTER — Ambulatory Visit (INDEPENDENT_AMBULATORY_CARE_PROVIDER_SITE_OTHER): Payer: Medicaid Other | Admitting: Obstetrics & Gynecology

## 2022-10-29 VITALS — BP 112/66 | HR 59 | Ht 64.0 in | Wt 201.2 lb

## 2022-10-29 DIAGNOSIS — R102 Pelvic and perineal pain: Secondary | ICD-10-CM | POA: Diagnosis not present

## 2022-10-29 MED ORDER — DOXYCYCLINE MONOHYDRATE 100 MG PO CAPS: 100.0000 mg | ORAL_CAPSULE | Freq: Two times a day (BID) | ORAL | 0 refills | Status: AC

## 2022-10-29 NOTE — Progress Notes (Signed)
   GYN VISIT Patient name: Patricia Horton MRN [id number]  Date of birth: [birth year] Chief Complaint:   Pelvic Pain (ED follow up )  History of Present Illness:   Patricia Horton is a 26 y.o. [phone]  female being seen today for the following concerns:.     Pelvic pain: Started last Saturday, all of sudden woke up with horrible pain.  Pain lasted for several days before she even went to the hospital.  Some nausea, no vomiting.  Tried OTC ibuprofen with minimal improvement.  Pain currently is tolerable, rates her pain 5-6/10.  Notes that when she moves a certain way she can feel it. So far, she has only had one period since the baby.  She does note hard stools, has BM about twice per day, but states it is painful and has to strain.  Sexually active with same partner  Patient's last menstrual period was 09/25/2022.     03/11/2022   10:04 AM 11/25/2021    2:11 PM 06/22/2013   12:58 PM  Depression screen PHQ 2/9  Decreased Interest 0 2 2  Down, Depressed, Hopeless 2 1 0  PHQ - 2 Score 2 3 2   Altered sleeping 0 2 1  Tired, decreased energy 2 3 2   Change in appetite 0 2 0  Feeling bad or failure about yourself  1 1 0  Trouble concentrating 0 0 0  Moving slowly or fidgety/restless 0 0 0  Suicidal thoughts 0 0 0  PHQ-9 Score 5 11 5      Review of Systems:   Pertinent items are noted in HPI Denies fever/chills, dizziness, headaches, visual disturbances, fatigue, shortness of breath, chest pain, abdominal pain, vomiting. Pertinent History Reviewed:  Reviewed past medical,surgical, social, obstetrical and family history.  Reviewed problem list, medications and allergies. Physical Assessment:   Vitals:   10/29/22 1612  BP: 112/66  Pulse: (!) 59  Weight: 201 lb 4 oz (91.3 kg)  Height: 5\' 4"  (1.626 m)  Body mass index is 34.54 kg/m.       Physical Examination:   General appearance: alert, well appearing, and in no distress  Psych: mood appropriate, normal affect  Skin: warm &  dry   Cardiovascular: normal heart rate noted  Respiratory: normal respiratory effort, no distress  Abdomen: soft, non-tender   Pelvic: VULVA: normal appearing vulva with no masses, tenderness or lesions, VAGINA: normal appearing vagina with normal color and discharge, no lesions, UTERUS: uterine normal size and shape- +tenderness noted on bimanual exam, ADNEXA: no masses appreciated  Extremities: no edema, no calf tenderness bilaterally  Chaperone:  pt declined     Assessment & Plan:  1) Pelvic pain -Working diagnosis ruptured cyst/ovulation pain vs PID.  Low suspicion for PID due to normal WBC and clinical history -For now continue with conservative management.  Should she not have improvement by this weekend, recommend starting Doxy and calling our office -if in the future she continues to have cyclical pain, may reconsider ovulation suppression treatment  -Phexxi for contraception   No orders of the defined types were placed in this encounter.   Return in about 1 year (around 10/30/2023) for Annual.   , DO Attending Obstetrician & Gynecologist, [facility], [facility]

## 2023-04-13 ENCOUNTER — Other Ambulatory Visit: Payer: Self-pay | Admitting: Adult Health

## 2023-04-13 ENCOUNTER — Ambulatory Visit (INDEPENDENT_AMBULATORY_CARE_PROVIDER_SITE_OTHER): Payer: Medicaid Other | Admitting: *Deleted

## 2023-04-13 ENCOUNTER — Other Ambulatory Visit (INDEPENDENT_AMBULATORY_CARE_PROVIDER_SITE_OTHER): Payer: Medicaid Other

## 2023-04-13 VITALS — BP 122/61 | HR 75 | Ht 64.0 in

## 2023-04-13 DIAGNOSIS — N926 Irregular menstruation, unspecified: Secondary | ICD-10-CM

## 2023-04-13 DIAGNOSIS — O3680X0 Pregnancy with inconclusive fetal viability, not applicable or unspecified: Secondary | ICD-10-CM

## 2023-04-13 DIAGNOSIS — Z3201 Encounter for pregnancy test, result positive: Secondary | ICD-10-CM

## 2023-04-13 DIAGNOSIS — Z3A01 Less than 8 weeks gestation of pregnancy: Secondary | ICD-10-CM | POA: Diagnosis not present

## 2023-04-13 DIAGNOSIS — Z3481 Encounter for supervision of other normal pregnancy, first trimester: Secondary | ICD-10-CM

## 2023-04-13 LAB — POCT URINE PREGNANCY: Preg Test, Ur: POSITIVE — AB

## 2023-04-13 MED ORDER — PROMETHAZINE HCL 12.5 MG PO TABS: 12.5000 mg | ORAL_TABLET | Freq: Four times a day (QID) | ORAL | 1 refills | Status: DC | PRN

## 2023-04-13 NOTE — Progress Notes (Signed)
Rx phenergan

## 2023-04-13 NOTE — Progress Notes (Signed)
   NURSE VISIT- PREGNANCY CONFIRMATION   SUBJECTIVE:  Patricia Horton is a 27 y.o. [phone] female at [redacted]w[redacted]d by certain LMP of No LMP recorded (lmp unknown). Patient is pregnant. Here for pregnancy confirmation.  Home pregnancy test: positive x 2   She reports nausea.  She is not taking prenatal vitamins.    OBJECTIVE:  BP 122/61 (BP Location: Right Arm, Patient Position: Sitting, Cuff Size: Normal)   Pulse 75   Ht 5\' 4"  (1.626 m)   LMP  (LMP Unknown)   Breastfeeding No   BMI 34.54 kg/m   Appears well, in no apparent distress  Results for orders placed or performed in visit on 04/13/23 (from the past 24 hour(s))  POCT urine pregnancy   Collection Time: 04/13/23  4:06 PM  Result Value Ref Range   Preg Test, Ur Positive (A) Negative    ASSESSMENT: Positive pregnancy test, [redacted]w[redacted]d by LMP    PLAN: Schedule for dating ultrasound today due to availability Prenatal vitamins: plans to begin OTC ASAP   Nausea medicines: requested-note routed to Cyril Mourning, NP to send prescription   OB packet given: Yes  Patricia Horton  04/13/2023 4:23 PM

## 2023-04-13 NOTE — Progress Notes (Signed)
[place] 7+1 wks,single IUP with yolk sac,CRL 10.45 mm,normal ovaries

## 2023-04-17 ENCOUNTER — Other Ambulatory Visit: Payer: Self-pay

## 2023-04-17 ENCOUNTER — Emergency Department (HOSPITAL_COMMUNITY)
Admission: EM | Admit: 2023-04-17 | Discharge: 2023-04-17 | Disposition: A | Discharge status: Home / Self Care | Payer: Medicaid Other | Attending: Emergency Medicine | Admitting: Emergency Medicine

## 2023-04-17 ENCOUNTER — Encounter (HOSPITAL_COMMUNITY): Payer: Self-pay

## 2023-04-17 DIAGNOSIS — O210 Mild hyperemesis gravidarum: Secondary | ICD-10-CM | POA: Diagnosis not present

## 2023-04-17 DIAGNOSIS — O219 Vomiting of pregnancy, unspecified: Secondary | ICD-10-CM | POA: Diagnosis present

## 2023-04-17 DIAGNOSIS — Z3A01 Less than 8 weeks gestation of pregnancy: Secondary | ICD-10-CM | POA: Insufficient documentation

## 2023-04-17 LAB — CBC
HCT: 39.6 % (ref 36.0–46.0)
Hemoglobin: 13.2 g/dL (ref 12.0–15.0)
MCH: 27.3 pg (ref 26.0–34.0)
MCHC: 33.3 g/dL (ref 30.0–36.0)
MCV: 81.8 fL (ref 80.0–100.0)
Platelets: 354 10*3/uL (ref 150–400)
RBC: 4.84 MIL/uL (ref 3.87–5.11)
RDW: 13.5 % (ref 11.5–15.5)
WBC: 8.4 10*3/uL (ref 4.0–10.5)
nRBC: 0 % (ref 0.0–0.2)

## 2023-04-17 LAB — COMPREHENSIVE METABOLIC PANEL
ALT: 37 U/L (ref 0–44)
AST: 41 U/L (ref 15–41)
Albumin: 4.2 g/dL (ref 3.5–5.0)
Alkaline Phosphatase: 49 U/L (ref 38–126)
Anion gap: 10 (ref 5–15)
BUN: 13 mg/dL (ref 6–20)
CO2: 20 mmol/L — ABNORMAL LOW (ref 22–32)
Calcium: 9.4 mg/dL (ref 8.9–10.3)
Chloride: 102 mmol/L (ref 98–111)
Creatinine, Ser: 0.76 mg/dL (ref 0.44–1.00)
GFR, Estimated: >60 mL/min (ref >60)
Glucose, Bld: 106 mg/dL — ABNORMAL HIGH (ref 70–99)
Potassium: 3.6 mmol/L (ref 3.5–5.1)
Sodium: 132 mmol/L — ABNORMAL LOW (ref 135–145)
Total Bilirubin: 1.3 mg/dL — ABNORMAL HIGH (ref 0.3–1.2)
Total Protein: 8.3 g/dL — ABNORMAL HIGH (ref 6.5–8.1)

## 2023-04-17 LAB — URINALYSIS, ROUTINE W REFLEX MICROSCOPIC
Glucose, UA: NEGATIVE mg/dL
Hgb urine dipstick: NEGATIVE
Ketones, ur: 40 mg/dL — AB
Leukocytes,Ua: NEGATIVE
Nitrite: NEGATIVE
Protein, ur: NEGATIVE mg/dL
Specific Gravity, Urine: >1.03 — ABNORMAL HIGH (ref 1.005–1.030)
pH: 6 (ref 5.0–8.0)

## 2023-04-17 MED ORDER — METOCLOPRAMIDE HCL 10 MG PO TABS: 10.0000 mg | ORAL_TABLET | Freq: Four times a day (QID) | ORAL | 0 refills | Status: DC

## 2023-04-17 MED ORDER — ONDANSETRON HCL 4 MG/2ML IJ SOLN
4.0000 mg | Freq: Once | INTRAMUSCULAR | Status: AC
  Administered 2023-04-17: 4 mg via INTRAVENOUS
  Filled 2023-04-17: qty 2

## 2023-04-17 MED ORDER — ONDANSETRON 4 MG PO TBDP: 4.0000 mg | ORAL_TABLET | Freq: Three times a day (TID) | ORAL | 0 refills | Status: DC | PRN

## 2023-04-17 MED ORDER — SODIUM CHLORIDE 0.9 % IV BOLUS
1000.0000 mL | Freq: Once | INTRAVENOUS | Status: AC
  Administered 2023-04-17: 1000 mL via INTRAVENOUS

## 2023-04-17 MED ORDER — ALUM & MAG HYDROXIDE-SIMETH 200-200-20 MG/5ML PO SUSP
30.0000 mL | Freq: Once | ORAL | Status: AC
  Administered 2023-04-17: 30 mL via ORAL
  Filled 2023-04-17: qty 30

## 2023-04-17 MED ORDER — METOCLOPRAMIDE HCL 5 MG/ML IJ SOLN
10.0000 mg | Freq: Once | INTRAMUSCULAR | Status: AC
  Administered 2023-04-17: 10 mg via INTRAVENOUS
  Filled 2023-04-17: qty 2

## 2023-04-17 NOTE — Discharge Instructions (Signed)
Thankfully your testing shows nothing other than mild dehydration, there is no signs of infection, I do want you to take the following medications to help with nausea as needed  I would recommend taking Pepcid over-the-counter to help with acid reflux,  Zofran is a medication which can help with nausea.  You may take 4 mg by mouth every 6 hours as needed if you are an adult, if your child under the age of 6 take half of a tablet or 2 mg every 6 hours as needed.  This should dissolve on your tongue within a short timeframe.  Wait about 30 minutes after taking it to help with drinking clear liquids.  As an alternative Reglan or metoclopramide can be taken 1 tablet every 8 hours as needed when you are pregnant, this is generally very safe.  Please do not drive or make important decisions while taking medications that may make you sleepy.  Thank you for allowing [place] to treat you in the emergency department today.  After reviewing your examination and potential testing that was done it appears that you are safe to go home.  I would like for you to follow-up with your doctor, especially the OB/GYN , within the next several days, have them obtain your results and follow-up with them to review all of these tests.  If you should develop severe or worsening symptoms return to the emergency department immediately

## 2023-04-17 NOTE — ED Triage Notes (Signed)
Pt is [redacted] weeks pregnant and has been vomiting. State she noticed some blood in it.

## 2023-04-17 NOTE — ED Provider Notes (Signed)
[address] EMERGENCY DEPARTMENT AT [facility] Provider Note   CSN: [id number] Arrival date & time: 04/17/23  1811     History  Chief Complaint  Patient presents with   Emesis During Pregnancy    Patricia Horton is a 27 y.o. female.  HPI   This patient is a 27 year old female, she is G2 P1 at approximately 7 weeks with what thus far has been an uncomplicated pregnancy.  This patient reports that over the last month she has had some progressive nausea and over the last week the nausea and vomiting has been rather persistent despite being called and a prescription for promethazine from her OB/GYN which she takes twice a day but it just makes her nauseated.  She has had repetitive vomiting and now is worried because there has been a little bit of blood in the vomit.  She has no urinary symptoms but is only urinated 1 time today after getting up 12 hours ago.  She is concerned because of dehydration.  Home Medications Prior to Admission medications   Medication Sig Start Date End Date Taking? Authorizing Provider  metoCLOPramide (REGLAN) 10 MG tablet Take 1 tablet (10 mg total) by mouth every 6 (six) hours. 04/17/23  Yes Eber Hong, MD  ondansetron (ZOFRAN-ODT) 4 MG disintegrating tablet Take 1 tablet (4 mg total) by mouth every 8 (eight) hours as needed for nausea. 04/17/23  Yes Eber Hong, MD  promethazine (PHENERGAN) 12.5 MG tablet Take 1 tablet (12.5 mg total) by mouth every 6 (six) hours as needed. 04/13/23   Adline Potter, NP  famotidine (PEPCID) 40 MG tablet Take 40 mg by mouth daily. 08/18/22   [provider]  Prenatal MV & Min w/FA-DHA (PRENATAL ADULT GUMMY/DHA/FA PO) Take by mouth. Takes 2 daily Patient not taking: Reported on 10/29/2022    [provider]      Allergies    Patient has no known allergies.    Review of Systems   Review of Systems  All other systems reviewed and are negative.   Physical Exam Updated Vital Signs BP  95/63   Pulse 62   Temp 99 F (37.2 C) (Oral)   Resp 18   LMP  (LMP Unknown)   SpO2 100%  Physical Exam Vitals and nursing note reviewed.  Constitutional:      General: She is not in acute distress.    Appearance: She is well-developed.  HENT:     Head: Normocephalic and atraumatic.     Mouth/Throat:     Pharynx: No oropharyngeal exudate.  Eyes:     General: No scleral icterus.       Right eye: No discharge.        Left eye: No discharge.     Conjunctiva/sclera: Conjunctivae normal.     Pupils: Pupils are equal, round, and reactive to light.  Neck:     Thyroid: No thyromegaly.     Vascular: No JVD.  Cardiovascular:     Rate and Rhythm: Normal rate and regular rhythm.     Heart sounds: Normal heart sounds. No murmur heard.    No friction rub. No gallop.  Pulmonary:     Effort: Pulmonary effort is normal. No respiratory distress.     Breath sounds: Normal breath sounds. No wheezing or rales.  Abdominal:     General: Bowel sounds are normal. There is no distension.     Palpations: Abdomen is soft. There is no mass.     Tenderness:  There is no abdominal tenderness.     Comments: Minimally tender mid abdomen and suprapubic, no guarding, no focal right lower quadrant tenderness  Musculoskeletal:        General: No tenderness. Normal range of motion.     Cervical back: Normal range of motion and neck supple.     Right lower leg: No edema.     Left lower leg: No edema.  Lymphadenopathy:     Cervical: No cervical adenopathy.  Skin:    General: Skin is warm and dry.     Findings: No erythema or rash.  Neurological:     Mental Status: She is alert.     Coordination: Coordination normal.  Psychiatric:        Behavior: Behavior normal.     ED Results / Procedures / Treatments   Labs (all labs ordered are listed, but only abnormal results are displayed) Labs Reviewed  COMPREHENSIVE METABOLIC PANEL - Abnormal; Notable for the following components:      Result Value    Sodium 132 (*)    CO2 20 (*)    Glucose, Bld 106 (*)    Total Protein 8.3 (*)    Total Bilirubin 1.3 (*)    All other components within normal limits  URINALYSIS, ROUTINE W REFLEX MICROSCOPIC - Abnormal; Notable for the following components:   Specific Gravity, Urine >1.030 (*)    Bilirubin Urine MODERATE (*)    Ketones, ur 40 (*)    All other components within normal limits  CBC    EKG None  Radiology No results found.  Procedures Procedures    Medications Ordered in ED Medications  sodium chloride 0.9 % bolus 1,000 mL (0 mLs Intravenous Stopped 04/17/23 2020)  ondansetron (ZOFRAN) injection 4 mg (4 mg Intravenous Given 04/17/23 1844)  sodium chloride 0.9 % bolus 1,000 mL (1,000 mLs Intravenous New Bag/Given 04/17/23 2020)  alum & mag hydroxide-simeth (MAALOX/MYLANTA) 200-200-20 MG/5ML suspension 30 mL (30 mLs Oral Given 04/17/23 2020)  metoCLOPramide (REGLAN) injection 10 mg (10 mg Intravenous Given 04/17/23 2040)    ED Course/ Medical Decision Making/ A&P Clinical Course as of 04/17/23 2114  Sat Apr 17, 2023  2024 Labs are unremarkable, there is no leukocytosis of any concern, no anemia, metabolic panel is reassuring and the urinalysis other than having a high specific gravity and ketones is no signs of infection.  She has been given a dose of Zofran as well as 2 L of IV fluid and a GI cocktail, some improvement although I suspect that she will have ongoing hyperemesis.  At this time I do not think she needs any further evaluation of her abdominal discomfort as it is likely related to the repetitive dry heaving and not related to an underlying surgical cause.  The patient is agreeable to the plan [BM]    Clinical Course User Index [BM] Eber Hong, MD                             Medical Decision Making Amount and/or Complexity of Data Reviewed Labs: ordered.  Risk OTC drugs. Prescription drug management.   This patient presents to the ED for concern of vomiting and  pregnancy differential diagnosis includes hyperemesis gravidarum, UTI, dehydration, consider electrolyte abnormalities    Additional history obtained:  Additional history obtained from medical record External records from outside source obtained and reviewed including OB/GYN notes, ultrasound performed on May 14 approximately 4 days ago showed  that the patient had a 7 weeks and 1 day single intrauterine pregnancy and normal ovaries.  Normal fetal heart rate at that time.   Lab Tests:  I Ordered, and personally interpreted labs.  The pertinent results include: CBC metabolic panel and urinalysis without any specific abnormal findings other than ketonuria   Imaging Studies ordered:  Reviewed previous ultrasound  Medicines ordered and prescription drug management:  I ordered medication including Zofran and metoclopramide as well as IV fluids for nausea vomiting and dehydration Reevaluation of the patient after these medicines showed that the patient improvement, patient also got a GI cocktail which helped I have reviewed the patients home medicines and have made adjustments as needed   Problem List / ED Course:  Hyperemesis, improved, clinically stable for discharge   Social Determinants of Health:  None  I have discussed with the patient at the bedside the results, and the meaning of these results.  They have expressed her understanding to the need for follow-up with primary care physician           Final Clinical Impression(s) / ED Diagnoses Final diagnoses:  Hyperemesis gravidarum    Rx / DC Orders ED Discharge Orders          Ordered    ondansetron (ZOFRAN-ODT) 4 MG disintegrating tablet  Every 8 hours PRN        04/17/23 2112    metoCLOPramide (REGLAN) 10 MG tablet  Every 6 hours        04/17/23 2112              Eber Hong, MD 04/17/23 2114

## 2023-05-19 ENCOUNTER — Telehealth: Payer: Self-pay | Admitting: *Deleted

## 2023-05-19 NOTE — Telephone Encounter (Signed)
Called patient to discuss upcoming visit on Monday.  Pt stated she was going to call our office as she thinks she has miscarried.  She had a lot of cramping on Saturday and ultimately passed a large clot and has since continued to bleed. Also not having pregnancy symptoms like she was earlier in the pregnancy. Discussed with Cyril Mourning, NP and patient advised she will need HCG and Rhogam due to blood type O neg. Patient states she works in [employer] and is unsure when she would be able to make it to our office.  Informed I would reach out to our [place] office to see if they could get her in tomorrow for Rhogam.  Their office is currently closed but will call at 8:30 in the morning. Pt verbalized understanding and agreeable to plan.

## 2023-05-20 ENCOUNTER — Telehealth: Payer: Self-pay | Admitting: *Deleted

## 2023-05-20 NOTE — Telephone Encounter (Signed)
Left patient an urgent message if she is able to come to the [place] office for Rhogam on 06/20 or 05/21/2023 before 12:00 PM, per [facility] office. Patient works in [place].

## 2023-05-21 ENCOUNTER — Other Ambulatory Visit: Payer: Self-pay | Admitting: Obstetrics & Gynecology

## 2023-05-21 DIAGNOSIS — Z3682 Encounter for antenatal screening for nuchal translucency: Secondary | ICD-10-CM

## 2023-05-24 ENCOUNTER — Encounter: Payer: Medicaid Other | Admitting: *Deleted

## 2023-05-24 ENCOUNTER — Other Ambulatory Visit: Payer: Medicaid Other

## 2023-05-24 ENCOUNTER — Encounter: Payer: Medicaid Other | Admitting: Advanced Practice Midwife

## 2023-05-24 DIAGNOSIS — O200 Threatened abortion: Secondary | ICD-10-CM

## 2023-05-24 DIAGNOSIS — O209 Hemorrhage in early pregnancy, unspecified: Secondary | ICD-10-CM

## 2023-09-16 ENCOUNTER — Encounter (HOSPITAL_COMMUNITY): Payer: Self-pay | Admitting: Emergency Medicine

## 2023-09-16 ENCOUNTER — Emergency Department (HOSPITAL_COMMUNITY): Payer: Medicaid Other

## 2023-09-16 ENCOUNTER — Other Ambulatory Visit: Payer: Self-pay

## 2023-09-16 ENCOUNTER — Emergency Department (HOSPITAL_COMMUNITY)
Admission: EM | Admit: 2023-09-16 | Discharge: 2023-09-16 | Disposition: A | Discharge status: Home / Self Care | Payer: Medicaid Other

## 2023-09-16 DIAGNOSIS — Z20822 Contact with and (suspected) exposure to covid-19: Secondary | ICD-10-CM | POA: Insufficient documentation

## 2023-09-16 DIAGNOSIS — J020 Streptococcal pharyngitis: Secondary | ICD-10-CM | POA: Insufficient documentation

## 2023-09-16 DIAGNOSIS — J029 Acute pharyngitis, unspecified: Secondary | ICD-10-CM | POA: Diagnosis present

## 2023-09-16 LAB — SARS CORONAVIRUS 2 BY RT PCR: SARS Coronavirus 2 by RT PCR: NEGATIVE

## 2023-09-16 LAB — GROUP A STREP BY PCR: Group A Strep by PCR: DETECTED — AB

## 2023-09-16 MED ORDER — AMOXICILLIN 500 MG PO CAPS: 500.0000 mg | ORAL_CAPSULE | Freq: Three times a day (TID) | ORAL | 0 refills | Status: DC

## 2023-09-16 NOTE — Discharge Instructions (Addendum)
Return if any problems.

## 2023-09-16 NOTE — ED Triage Notes (Signed)
Pt presents with URI symptoms, sore throat, body aches, loss of voice, since last Friday.

## 2023-09-16 NOTE — ED Provider Notes (Signed)
[place] [facility]MENT AT [facility] Provider Note   CSN: [id number] Arrival date & time: 09/16/23  1335     History  Chief Complaint  Patient presents with   Sore Throat    Patricia Horton is a 27 y.o. female.  Complains of a sore throat and cough.  Patient reports her symptoms began on Friday.  She is unaware of any exposures to anyone with illness.  Patient denies any fever she denies any chills.  Patient denies chest pain or abdominal pain  The history is provided by the patient. No language interpreter was used.  Sore Throat       Home Medications Prior to Admission medications   Medication Sig Start Date End Date Taking? Authorizing Provider  promethazine (PHENERGAN) 12.5 MG tablet Take 1 tablet (12.5 mg total) by mouth every 6 (six) hours as needed. 04/13/23   Adline Potter, NP  famotidine (PEPCID) 40 MG tablet Take 40 mg by mouth daily. 08/18/22   [provider]  metoCLOPramide (REGLAN) 10 MG tablet Take 1 tablet (10 mg total) by mouth every 6 (six) hours. 04/17/23   Eber Hong, MD  ondansetron (ZOFRAN-ODT) 4 MG disintegrating tablet Take 1 tablet (4 mg total) by mouth every 8 (eight) hours as needed for nausea. 04/17/23   Eber Hong, MD  Prenatal MV & Min w/FA-DHA (PRENATAL ADULT GUMMY/DHA/FA PO) Take by mouth. Takes 2 daily Patient not taking: Reported on 10/29/2022    [provider]      Allergies    Patient has no known allergies.    Review of Systems   Review of Systems  HENT:  Positive for sore throat.   All other systems reviewed and are negative.   Physical Exam Updated Vital Signs BP 128/81   Pulse (!) 103   Temp 99.8 F (37.7 C) (Oral)   Resp 16   Ht 5\' 4"  (1.626 m)   Wt 91.3 kg   LMP 09/14/2023 (Exact Date)   SpO2 100%   Breastfeeding No   BMI 34.55 kg/m  Physical Exam Vitals and nursing note reviewed.  Constitutional:      Appearance: She is well-developed.  HENT:     Head:  Normocephalic.     Nose: Congestion present.     Mouth/Throat:     Pharynx: Posterior oropharyngeal erythema present.  Cardiovascular:     Rate and Rhythm: Normal rate.  Pulmonary:     Effort: Pulmonary effort is normal.  Abdominal:     General: There is no distension.  Musculoskeletal:        General: Normal range of motion.     Cervical back: Normal range of motion.  Skin:    General: Skin is warm.  Neurological:     Mental Status: She is alert and oriented to person, place, and time.     ED Results / Procedures / Treatments   Labs (all labs ordered are listed, but only abnormal results are displayed) Labs Reviewed  GROUP A STREP BY PCR - Abnormal; Notable for the following components:      Result Value   Group A Strep by PCR DETECTED (*)    All other components within normal limits  SARS CORONAVIRUS 2 BY RT PCR    EKG None  Radiology No results found.  Procedures Procedures    Medications Ordered in ED Medications - No data to display  ED Course/ Medical Decision Making/ A&P Clinical Course as of 09/16/23 1511  Thu  Sep 16, 2023  1502 Group A Strep by PCR(!) [LS]    Clinical Course User Index [LS] Elson Areas, PA-C                                 Medical Decision Making Complains of a sore throat and cough  Amount and/or Complexity of Data Reviewed Labs: ordered. Decision-making details documented in ED Course.    Details: Abs ordered reviewed and interpreted.  COVID is negative strep screen is positive Radiology: ordered.  Risk Prescription drug management. Risk Details: Given a prescription for amoxicillin she is advised Tylenol and lozenges over-the-counter.  She is advised to return if any problems           Final Clinical Impression(s) / ED Diagnoses Final diagnoses:  Strep pharyngitis    Rx / DC Orders ED Discharge Orders          Ordered    amoxicillin (AMOXIL) 500 MG capsule  3 times daily        09/16/23 1514            An After Visit Summary was printed and given to the patient.    Elson Areas, PA-C 09/16/23 1515    Coral Spikes, DO 09/24/23 [phone]

## 2023-09-28 ENCOUNTER — Ambulatory Visit: Payer: Medicaid Other | Admitting: Adult Health

## 2024-01-16 ENCOUNTER — Emergency Department (HOSPITAL_COMMUNITY)
Admission: EM | Admit: 2024-01-16 | Discharge: 2024-01-16 | Disposition: A | Discharge status: Home / Self Care | Payer: Medicaid Other | Attending: Emergency Medicine | Admitting: Emergency Medicine

## 2024-01-16 ENCOUNTER — Other Ambulatory Visit: Payer: Self-pay

## 2024-01-16 ENCOUNTER — Emergency Department (HOSPITAL_COMMUNITY): Payer: Medicaid Other

## 2024-01-16 ENCOUNTER — Encounter (HOSPITAL_COMMUNITY): Payer: Self-pay | Admitting: *Deleted

## 2024-01-16 DIAGNOSIS — R079 Chest pain, unspecified: Secondary | ICD-10-CM | POA: Insufficient documentation

## 2024-01-16 DIAGNOSIS — R1013 Epigastric pain: Secondary | ICD-10-CM | POA: Insufficient documentation

## 2024-01-16 LAB — CBC
HCT: 38.9 % (ref 36.0–46.0)
Hemoglobin: 13 g/dL (ref 12.0–15.0)
MCH: 27.2 pg (ref 26.0–34.0)
MCHC: 33.4 g/dL (ref 30.0–36.0)
MCV: 81.4 fL (ref 80.0–100.0)
Platelets: 293 10*3/uL (ref 150–400)
RBC: 4.78 MIL/uL (ref 3.87–5.11)
RDW: 14.1 % (ref 11.5–15.5)
WBC: 5.3 10*3/uL (ref 4.0–10.5)
nRBC: 0 % (ref 0.0–0.2)

## 2024-01-16 LAB — BASIC METABOLIC PANEL
Anion gap: 9 (ref 5–15)
BUN: 13 mg/dL (ref 6–20)
CO2: 22 mmol/L (ref 22–32)
Calcium: 9.2 mg/dL (ref 8.9–10.3)
Chloride: 104 mmol/L (ref 98–111)
Creatinine, Ser: 0.74 mg/dL (ref 0.44–1.00)
GFR, Estimated: >60 mL/min (ref >60)
Glucose, Bld: 108 mg/dL — ABNORMAL HIGH (ref 70–99)
Potassium: 3.9 mmol/L (ref 3.5–5.1)
Sodium: 135 mmol/L (ref 135–145)

## 2024-01-16 LAB — TROPONIN I (HIGH SENSITIVITY)
Troponin I (High Sensitivity): <2 ng/L (ref <18)
Troponin I (High Sensitivity): <2 ng/L (ref <18)

## 2024-01-16 LAB — LIPASE, BLOOD: Lipase: 28 U/L (ref 11–51)

## 2024-01-16 LAB — PREGNANCY, URINE: Preg Test, Ur: NEGATIVE

## 2024-01-16 MED ORDER — ALUM & MAG HYDROXIDE-SIMETH 200-200-20 MG/5ML PO SUSP
30.0000 mL | Freq: Once | ORAL | Status: AC
  Administered 2024-01-16: 30 mL via ORAL
  Filled 2024-01-16: qty 30

## 2024-01-16 MED ORDER — OMEPRAZOLE 20 MG PO TBDD: 20.0000 mg | DELAYED_RELEASE_TABLET | Freq: Every day | ORAL | 0 refills | Status: DC

## 2024-01-16 NOTE — ED Triage Notes (Signed)
Pt with mid to left CP for months that comes and goes, daily here recently per pt. + nausea for past 2 weeks. Right abd pain that is sharp.

## 2024-01-16 NOTE — ED Provider Notes (Signed)
[place] EMERGENCY DEPARTMENT AT [facility] Provider Note   CSN: [id number] Arrival date & time: 01/16/24  1237     History  Chief Complaint  Patient presents with   Chest Pain    Patricia Horton is a 28 y.o. female.  Having epigastric and chest pain, worse after eating for several months, occasionally has some right upper quadrant pain with it as well.  Has not seen a medical provider for this yet but states her mother insisted today she get this checked out.  No shortness of breath, no cough, no fevers or chills, no nausea or vomiting or constipation.  No diarrhea.     Chest Pain      Home Medications Prior to Admission medications   Medication Sig Start Date End Date Taking? Authorizing Provider  promethazine (PHENERGAN) 12.5 MG tablet Take 1 tablet (12.5 mg total) by mouth every 6 (six) hours as needed. 04/13/23   Adline Potter, NP  amoxicillin (AMOXIL) 500 MG capsule Take 1 capsule (500 mg total) by mouth 3 (three) times daily. 09/16/23   Elson Areas, PA-C  famotidine (PEPCID) 40 MG tablet Take 40 mg by mouth daily. 08/18/22   [provider]  metoCLOPramide (REGLAN) 10 MG tablet Take 1 tablet (10 mg total) by mouth every 6 (six) hours. 04/17/23   Eber Hong, MD  ondansetron (ZOFRAN-ODT) 4 MG disintegrating tablet Take 1 tablet (4 mg total) by mouth every 8 (eight) hours as needed for nausea. 04/17/23   Eber Hong, MD  Prenatal MV & Min w/FA-DHA (PRENATAL ADULT GUMMY/DHA/FA PO) Take by mouth. Takes 2 daily Patient not taking: Reported on 10/29/2022    [provider]      Allergies    Patient has no known allergies.    Review of Systems   Review of Systems  Cardiovascular:  Positive for chest pain.    Physical Exam Updated Vital Signs BP 127/84   Pulse 81   Temp 99.3 F (37.4 C) (Oral)   Resp 19   Ht 5\' 4"  (1.626 m)   Wt 99.8 kg   LMP 12/23/2023   SpO2 99%   BMI 37.76 kg/m  Physical Exam Vitals and nursing note  reviewed.  Constitutional:      General: She is not in acute distress.    Appearance: She is well-developed.  HENT:     Head: Normocephalic and atraumatic.  Eyes:     Extraocular Movements: Extraocular movements intact.     Conjunctiva/sclera: Conjunctivae normal.     Pupils: Pupils are equal, round, and reactive to light.  Cardiovascular:     Rate and Rhythm: Normal rate and regular rhythm.     Heart sounds: No murmur heard. Pulmonary:     Effort: Pulmonary effort is normal. No respiratory distress.     Breath sounds: Normal breath sounds.  Abdominal:     Palpations: Abdomen is soft.     Tenderness: There is abdominal tenderness in the epigastric area. There is no guarding or rebound.  Musculoskeletal:        General: No swelling.     Cervical back: Neck supple.  Skin:    General: Skin is warm and dry.     Capillary Refill: Capillary refill takes less than 2 seconds.  Neurological:     General: No focal deficit present.     Mental Status: She is alert and oriented to person, place, and time.  Psychiatric:        Mood  and Affect: Mood normal.     ED Results / Procedures / Treatments   Labs (all labs ordered are listed, but only abnormal results are displayed) Labs Reviewed  BASIC METABOLIC PANEL - Abnormal; Notable for the following components:      Result Value   Glucose, Bld 108 (*)    All other components within normal limits  CBC  PREGNANCY, URINE  LIPASE, BLOOD  TROPONIN I (HIGH SENSITIVITY)  TROPONIN I (HIGH SENSITIVITY)    EKG None  Radiology DG Chest 2 View Result Date: 01/16/2024 CLINICAL DATA:  CP/ abd pain EXAM: CHEST - 2 VIEW COMPARISON:  September 16, 2023 FINDINGS: The cardiomediastinal silhouette is normal in contour. No pleural effusion. No pneumothorax. No acute pleuroparenchymal abnormality. Visualized abdomen is unremarkable. No acute osseous abnormality noted. IMPRESSION: No acute cardiopulmonary abnormality. Electronically Signed   By: Meda Klinefelter M.D.   On: 01/16/2024 13:52    Procedures Procedures    Medications Ordered in ED Medications - No data to display  ED Course/ Medical Decision Making/ A&P                                 Medical Decision Making DDx: GERD, pneumonia, PE, ACS, pneumothorax, cholecystitis, pancreatitis, other  Course: Patient has been having months of epigastric pain and chest pain that is worse after eating, denies any specific foods triggering this, no pain in her back of her shoulder with this.  No fever or chills, no nausea or vomiting.  She states she thinks it may be acid reflux as it started after she had her child.  Labs were ordered, these are all very reassuring no leukocytosis, no anemia, BMP is normal, lipase is normal and troponin negative.  Chest x-ray ordered shows no pulm edema, no normal thorax, no infiltrates.  Patient is PERC negative low risk Wells criteria.  No need for further workup for PE.  She was given a GI cocktail and had some improvement of her symptoms.  Will start her on a PPI.  Given ODT omeprazole as she states she cannot swallow pills.  Advised on follow-up with PCP and GI.  She was given strict return precautions.  Amount and/or Complexity of Data Reviewed Labs: ordered. Radiology: ordered.  Risk OTC drugs.           Final Clinical Impression(s) / ED Diagnoses Final diagnoses:  None    Rx / DC Orders ED Discharge Orders     None         Ma Rings, PA-C 01/16/24 2023    Loetta Rough, MD 01/16/24 2137

## 2024-01-16 NOTE — ED Notes (Signed)
Patient discharged. Provider spoke to patient. Paperwork given to patient and reviewed. Pt verbalized understanding. VSS. A+Ox4. Patient ambulated out of the ER with steady, independent gait. No IV in place.

## 2024-01-16 NOTE — Discharge Instructions (Signed)
You are seen in the ER today for pain in your upper abdomen and chest, that is worse after eating.  All of your blood work was reassuring as well as your chest x-ray and EKG.  There is no signs of a heart or lung problem.  I suspect that this is related to GERD. I am prescribing you a medication to help with the acid production.

## 2024-01-28 ENCOUNTER — Ambulatory Visit: Payer: Medicaid Other | Admitting: *Deleted

## 2024-01-28 VITALS — BP 109/65 | HR 65 | Ht 64.0 in | Wt 228.2 lb

## 2024-01-28 DIAGNOSIS — Z3202 Encounter for pregnancy test, result negative: Secondary | ICD-10-CM | POA: Diagnosis not present

## 2024-01-28 DIAGNOSIS — N926 Irregular menstruation, unspecified: Secondary | ICD-10-CM

## 2024-01-28 LAB — POCT URINE PREGNANCY: Preg Test, Ur: NEGATIVE

## 2024-01-28 NOTE — Progress Notes (Signed)
   NURSE VISIT- PREGNANCY CONFIRMATION   SUBJECTIVE:  Patricia Horton is a 28 y.o. [phone] female by certain LMP of Patient's last menstrual period was 12/23/2023 (approximate). Here for pregnancy confirmation.  Home pregnancy test: positive x 2   She reports nausea.  She is taking prenatal vitamins.    OBJECTIVE:  BP 109/65 (BP Location: Right Arm, Patient Position: Sitting, Cuff Size: Normal)   Pulse 65   Ht 5\' 4"  (1.626 m)   Wt 228 lb 3.2 oz (103.5 kg)   LMP 12/23/2023 (Approximate)   Breastfeeding No   BMI 39.17 kg/m   Appears well, in no apparent distress  Results for orders placed or performed in visit on 01/28/24 (from the past 24 hours)  POCT urine pregnancy   Collection Time: 01/28/24 11:56 AM  Result Value Ref Range   Preg Test, Ur Negative Negative    ASSESSMENT: Positive pregnancy test with certain LMP     PLAN: Schedule for dating ultrasound TBD based on HCG Prenatal vitamins: continue   Nausea medicines: not currently needed   OB packet given: No  Jobe Marker  01/28/2024 12:02 PM

## 2024-01-29 LAB — BETA HCG QUANT (REF LAB): hCG Quant: <1 m[IU]/mL

## 2024-02-01 ENCOUNTER — Encounter: Payer: Self-pay | Admitting: Adult Health

## 2024-02-01 ENCOUNTER — Ambulatory Visit: Admitting: Adult Health

## 2024-02-01 VITALS — BP 122/69 | HR 73 | Ht 64.0 in | Wt 233.0 lb

## 2024-02-01 DIAGNOSIS — Z3202 Encounter for pregnancy test, result negative: Secondary | ICD-10-CM | POA: Diagnosis not present

## 2024-02-01 DIAGNOSIS — N926 Irregular menstruation, unspecified: Secondary | ICD-10-CM

## 2024-02-01 LAB — POCT URINE PREGNANCY: Preg Test, Ur: NEGATIVE

## 2024-02-01 MED ORDER — ONDANSETRON 4 MG PO TBDP: 4.0000 mg | ORAL_TABLET | Freq: Three times a day (TID) | ORAL | 1 refills | Status: AC | PRN

## 2024-02-01 MED ORDER — PRENATAL PLUS 27-1 MG PO TABS: 1.0000 | ORAL_TABLET | Freq: Every day | ORAL | 12 refills | Status: AC

## 2024-02-01 NOTE — Progress Notes (Signed)
  Subjective:     Patient ID: Patricia Horton, female   DOB: [birth year], 28 y.o.   MRN: [id number]  HPI Patricia Horton is a 28 year old black female, with SO, [id number] in complaining of having missed a period by about 9 days, had negative QHCG 01/28/24. Has had nausea.     Component Value Date/Time   DIAGPAP  09/16/2021 0924    - Negative for intraepithelial lesion or malignancy (NILM)   HPVHIGH Negative 09/16/2021 0924   ADEQPAP  09/16/2021 0924    Satisfactory for evaluation; transformation zone component ABSENT.    Review of Systems +missed period +nausea Reviewed past medical,surgical, social and family history. Reviewed medications and allergies.     Objective:   Physical Exam BP 122/69 (BP Location: Left Arm, Patient Position: Sitting, Cuff Size: Normal)   Pulse 73   Ht 5\' 4"  (1.626 m)   Wt 233 lb (105.7 kg)   LMP 12/23/2023 (Approximate)   Breastfeeding No   BMI 39.99 kg/m  UPT is negative Skin warm and dry.  Lungs: clear to ausculation bilaterally. Cardiovascular: regular rate and rhythm.   Upstream - 02/01/24 0936       Pregnancy Intention Screening   Does the patient want to become pregnant in the next year? Ok Either Way    Does the patient's partner want to become pregnant in the next year? Ok Either Way    Would the patient like to discuss contraceptive options today? No      Contraception Wrap Up   Current Method No Method - Other Reason    End Method No Method - Other Reason                Assessment:     1. Missed period (Primary) +missed period, 9 days late UPT negative Will follow up 3/36/25 to see if started or not, if starts can cancel Will watch for 3 months  2. Pregnancy examination or test, negative result - POCT urine pregnancy     Plan:     Rx sent in for PNV and zofran Meds ordered this encounter  Medications   prenatal vitamin w/FE, FA (PRENATAL 1 + 1) 27-1 MG TABS tablet    Sig: Take 1 tablet by mouth daily at 12 noon.    Dispense:   30 tablet    Refill:  12    Supervising Provider:   Duane Lope H [[id number]]   ondansetron (ZOFRAN-ODT) 4 MG disintegrating tablet    Sig: Take 1 tablet (4 mg total) by mouth every 8 (eight) hours as needed for nausea.    Dispense:  30 tablet    Refill:  1    Supervising Provider:   Duane Lope H [[id number]]   Follow up 3/36/25 if no period

## 2024-02-07 ENCOUNTER — Other Ambulatory Visit: Payer: Self-pay | Admitting: Adult Health

## 2024-02-07 DIAGNOSIS — N926 Irregular menstruation, unspecified: Secondary | ICD-10-CM

## 2024-02-07 DIAGNOSIS — R102 Pelvic and perineal pain: Secondary | ICD-10-CM

## 2024-02-07 NOTE — Progress Notes (Signed)
Will get US 

## 2024-02-15 ENCOUNTER — Other Ambulatory Visit

## 2024-02-18 ENCOUNTER — Ambulatory Visit
Admission: RE | Admit: 2024-02-18 | Discharge: 2024-02-18 | Disposition: A | Discharge status: Home / Self Care | Source: Ambulatory Visit | Attending: Adult Health | Admitting: Adult Health

## 2024-02-18 DIAGNOSIS — R102 Pelvic and perineal pain: Secondary | ICD-10-CM

## 2024-02-18 DIAGNOSIS — N926 Irregular menstruation, unspecified: Secondary | ICD-10-CM

## 2024-02-23 ENCOUNTER — Encounter: Payer: Self-pay | Admitting: Adult Health

## 2024-02-23 ENCOUNTER — Ambulatory Visit: Admitting: Adult Health

## 2024-02-23 VITALS — BP 116/76 | HR 61 | Ht 64.0 in | Wt 223.0 lb

## 2024-02-23 DIAGNOSIS — Z3202 Encounter for pregnancy test, result negative: Secondary | ICD-10-CM | POA: Diagnosis not present

## 2024-02-23 DIAGNOSIS — N926 Irregular menstruation, unspecified: Secondary | ICD-10-CM | POA: Diagnosis not present

## 2024-02-23 LAB — POCT URINE PREGNANCY: Preg Test, Ur: NEGATIVE

## 2024-02-23 NOTE — Progress Notes (Signed)
  Subjective:     Patient ID: Patricia Horton, female   DOB: [birth year], 28 y.o.   MRN: [id number]  HPI Patricia Horton is a 28 year old black female, with SO, [id number], in complaining of no period since 12/23/23, spotted 1 day 2 weeks ago and spotted today for an hour. Had negative QHCG 01/28/24.     Component Value Date/Time   DIAGPAP  09/16/2021 0924    - Negative for intraepithelial lesion or malignancy (NILM)   HPVHIGH Negative 09/16/2021 0924   ADEQPAP  09/16/2021 0924    Satisfactory for evaluation; transformation zone component ABSENT.     Review of Systems No period since 12/23/23, has spotted this morning for an hour and same 2 weeks ago Has pain left side at times Still has nausea at times     Objective:   Physical Exam BP 116/76 (BP Location: Right Arm, Patient Position: Sitting, Cuff Size: Large)   Pulse 61   Ht 5\' 4"  (1.626 m)   Wt 223 lb (101.2 kg)   LMP 12/23/2023 (Approximate)   Breastfeeding No   BMI 38.28 kg/m    UPT is negative    Skin warm and dry. Lungs: clear to ausculation bilaterally. Cardiovascular: regular rate and rhythm.   Upstream - 02/23/24 1046       Pregnancy Intention Screening   Does the patient want to become pregnant in the next year? Ok Either Way    Does the patient's partner want to become pregnant in the next year? Yes      Contraception Wrap Up   Current Method No Method - Other Reason    End Method No Method - Other Reason    Contraception Counseling Provided No             Assessment:     1. Pregnancy test negative - POCT urine pregnancy - Beta hCG quant (ref lab)  2. Missed period (Primary) No period since 12/23/23 Spotted 2 weeks ago for an hour and again today  - Beta hCG quant (ref lab)     Plan:     Follow up prn

## 2024-02-25 ENCOUNTER — Telehealth: Payer: Self-pay | Admitting: Adult Health

## 2024-02-25 NOTE — Telephone Encounter (Signed)
 Left message I called

## 2024-02-29 ENCOUNTER — Telehealth: Payer: Self-pay | Admitting: Adult Health

## 2024-02-29 NOTE — Telephone Encounter (Signed)
 Encouraged lifestyle changes, to help with weight loss. Decrease carbs, eat fresh fruits and veggies and try to walk 30-60 minutes 5-7 days a week. Call if any concerns.

## 2024-03-02 ENCOUNTER — Other Ambulatory Visit: Payer: Self-pay | Admitting: Women's Health

## 2024-04-03 ENCOUNTER — Emergency Department (HOSPITAL_COMMUNITY)

## 2024-04-03 ENCOUNTER — Other Ambulatory Visit: Payer: Self-pay

## 2024-04-03 ENCOUNTER — Encounter (HOSPITAL_COMMUNITY): Payer: Self-pay

## 2024-04-03 ENCOUNTER — Emergency Department (HOSPITAL_COMMUNITY)
Admission: EM | Admit: 2024-04-03 | Discharge: 2024-04-03 | Discharge status: Against Medical Advice | Attending: Emergency Medicine | Admitting: Emergency Medicine

## 2024-04-03 DIAGNOSIS — R079 Chest pain, unspecified: Secondary | ICD-10-CM | POA: Diagnosis not present

## 2024-04-03 DIAGNOSIS — M62838 Other muscle spasm: Secondary | ICD-10-CM | POA: Diagnosis present

## 2024-04-03 DIAGNOSIS — Z5321 Procedure and treatment not carried out due to patient leaving prior to being seen by health care provider: Secondary | ICD-10-CM | POA: Insufficient documentation

## 2024-04-03 DIAGNOSIS — R200 Anesthesia of skin: Secondary | ICD-10-CM | POA: Diagnosis not present

## 2024-04-03 DIAGNOSIS — R0602 Shortness of breath: Secondary | ICD-10-CM | POA: Diagnosis not present

## 2024-04-03 LAB — CBC WITH DIFFERENTIAL/PLATELET
Abs Immature Granulocytes: 0.02 10*3/uL (ref 0.00–0.07)
Basophils Absolute: 0 10*3/uL (ref 0.0–0.1)
Basophils Relative: 0 %
Eosinophils Absolute: 0.1 10*3/uL (ref 0.0–0.5)
Eosinophils Relative: 2 %
HCT: 41 % (ref 36.0–46.0)
Hemoglobin: 13 g/dL (ref 12.0–15.0)
Immature Granulocytes: 0 %
Lymphocytes Relative: 36 %
Lymphs Abs: 2.6 10*3/uL (ref 0.7–4.0)
MCH: 26.4 pg (ref 26.0–34.0)
MCHC: 31.7 g/dL (ref 30.0–36.0)
MCV: 83.2 fL (ref 80.0–100.0)
Monocytes Absolute: 0.6 10*3/uL (ref 0.1–1.0)
Monocytes Relative: 8 %
Neutro Abs: 4 10*3/uL (ref 1.7–7.7)
Neutrophils Relative %: 54 %
Platelets: 380 10*3/uL (ref 150–400)
RBC: 4.93 MIL/uL (ref 3.87–5.11)
RDW: 14.6 % (ref 11.5–15.5)
WBC: 7.4 10*3/uL (ref 4.0–10.5)
nRBC: 0 % (ref 0.0–0.2)

## 2024-04-03 LAB — COMPREHENSIVE METABOLIC PANEL WITH GFR
ALT: 23 U/L (ref 0–44)
AST: 19 U/L (ref 15–41)
Albumin: 4.3 g/dL (ref 3.5–5.0)
Alkaline Phosphatase: 59 U/L (ref 38–126)
Anion gap: 10 (ref 5–15)
BUN: 16 mg/dL (ref 6–20)
CO2: 29 mmol/L (ref 22–32)
Calcium: 9.7 mg/dL (ref 8.9–10.3)
Chloride: 97 mmol/L — ABNORMAL LOW (ref 98–111)
Creatinine, Ser: 1.06 mg/dL — ABNORMAL HIGH (ref 0.44–1.00)
GFR, Estimated: >60 mL/min (ref >60)
Glucose, Bld: 112 mg/dL — ABNORMAL HIGH (ref 70–99)
Potassium: 3.5 mmol/L (ref 3.5–5.1)
Sodium: 136 mmol/L (ref 135–145)
Total Bilirubin: 0.5 mg/dL (ref 0.0–1.2)
Total Protein: 8.3 g/dL — ABNORMAL HIGH (ref 6.5–8.1)

## 2024-04-03 LAB — TROPONIN I (HIGH SENSITIVITY): Troponin I (High Sensitivity): <2 ng/L (ref <18)

## 2024-04-03 LAB — BRAIN NATRIURETIC PEPTIDE: B Natriuretic Peptide: 9 pg/mL (ref 0.0–100.0)

## 2024-04-03 NOTE — ED Triage Notes (Signed)
 Pt arrived via POV c/o muscle spasms and cramps that reports begin in her neck and then travel throughout her whole body. Pt reports symptoms began 2 weeks ago, but recently, after beginning her menstrual cycle the pain, numbness and tingling and cramping have intensified. Pt does endorse chest pain and denies any known injury.

## 2024-04-05 ENCOUNTER — Ambulatory Visit
Admission: RE | Admit: 2024-04-05 | Discharge: 2024-04-05 | Disposition: A | Discharge status: Home / Self Care | Source: Ambulatory Visit | Attending: Family Medicine | Admitting: Family Medicine

## 2024-04-05 VITALS — BP 111/74 | HR 78 | Temp 98.4°F | Resp 18

## 2024-04-05 DIAGNOSIS — Z113 Encounter for screening for infections with a predominantly sexual mode of transmission: Secondary | ICD-10-CM | POA: Diagnosis not present

## 2024-04-05 NOTE — ED Provider Notes (Signed)
 [facility]    CSN: [id number] Arrival date & time: 04/05/24  1107      History   Chief Complaint Chief Complaint  Patient presents with   SEXUALLY TRANSMITTED DISEASE    I wanna get tested do an STD screening . - Entered by patient    HPI Patricia Horton is a 28 y.o. female.   Patient comes in requesting routine sexually-transmitted infection testing-has been sexually active with a female partner for the past 8 months.  They do not use routine protection.  She is not currently reporting any symptoms.  No abnormal vaginal bleeding, discharge, or other such concerns.  Patient is currently on her menstrual cycle.  No other concerns reported.  History of gonorrhea approximately 10 years ago.  The history is provided by the patient.    Past Medical History:  Diagnosis Date   Asthma    Migraine with aura 2014   NO ESTROGEN   Seizures (HCC)    last seizure 2017    Patient Active Problem List   Diagnosis Date Noted   Pregnancy test negative 02/23/2024   Missed period 02/01/2024   Pregnancy 06/01/2022   Alpha thalassemia silent carrier 12/09/2021   Migraine with aura 08/14/2014   Seizures (HCC) 03/01/2013    Past Surgical History:  Procedure Laterality Date   DILATION AND EVACUATION N/A 10/01/2020   Procedure: DILATATION AND EVACUATION;  Surgeon: Jan Mcgill, MD;  Location: [facility];  Service: Gynecology;  Laterality: N/A;   NO PAST SURGERIES      OB History     Gravida  4   Para  1   Term  1   Preterm  0   AB  3   Living  1      SAB  3   IAB  0   Ectopic  0   Multiple  0   Live Births  1            Home Medications    Prior to Admission medications   Medication Sig Start Date End Date Taking? Authorizing Provider  ondansetron  (ZOFRAN -ODT) 4 MG disintegrating tablet Take 1 tablet (4 mg total) by mouth every 8 (eight) hours as needed for nausea. 02/01/24   Javan Messing, NP  prenatal vitamin w/FE, FA  (PRENATAL 1 + 1) 27-1 MG TABS tablet Take 1 tablet by mouth daily at 12 noon. 02/01/24   Javan Messing, NP    Family History Family History  Problem Relation Age of Onset   Cancer Paternal Grandmother        breast   Heart disease Paternal Grandmother    Healthy Father    Diabetes Mother    Stroke Mother    Hypertension Mother    Heart disease Paternal Aunt     Social History Social History   Tobacco Use   Smoking status: Never   Smokeless tobacco: Never  Vaping Use   Vaping status: Never Used  Substance Use Topics   Alcohol use: Not Currently    Comment: occ   Drug use: No     Allergies   Patient has no known allergies.   Review of Systems Review of Systems  Constitutional:  Negative for chills and fever.  HENT:  Negative for congestion, rhinorrhea and sore throat.   Eyes:  Negative for pain and redness.  Respiratory:  Negative for cough and shortness of breath.   Cardiovascular:  Negative for chest pain and palpitations.  Gastrointestinal:  Negative for abdominal pain, diarrhea and vomiting.  Genitourinary:  Negative for difficulty urinating, dysuria, frequency, pelvic pain, vaginal bleeding, vaginal discharge and vaginal pain.  Musculoskeletal:  Negative for arthralgias, back pain and myalgias.  Skin:  Negative for rash.  Neurological:  Negative for dizziness and headaches.     Physical Exam Triage Vital Signs ED Triage Vitals  Encounter Vitals Group     BP 04/05/24 1133 111/74     Systolic BP Percentile --      Diastolic BP Percentile --      Pulse Rate 04/05/24 1133 78     Resp 04/05/24 1133 18     Temp 04/05/24 1133 98.4 F (36.9 C)     Temp Source 04/05/24 1133 Oral     SpO2 --      Weight --      Height --      Head Circumference --      Peak Flow --      Pain Score 04/05/24 1137 0     Pain Loc --      Pain Education --      Exclude from Growth Chart --    No data found.  Updated Vital Signs BP 111/74 (BP Location: Right Arm)    Pulse 78   Temp 98.4 F (36.9 C) (Oral)   Resp 18   LMP 04/03/2024 (Exact Date)   Breastfeeding No   Visual Acuity Right Eye Distance:   Left Eye Distance:   Bilateral Distance:    Right Eye Near:   Left Eye Near:    Bilateral Near:     Physical Exam Vitals and nursing note reviewed.  Constitutional:      Appearance: Normal appearance.  Cardiovascular:     Rate and Rhythm: Normal rate and regular rhythm.     Heart sounds: Normal heart sounds.  Pulmonary:     Effort: Pulmonary effort is normal.     Breath sounds: Normal breath sounds.  Abdominal:     General: Bowel sounds are normal.  Neurological:     General: No focal deficit present.     Mental Status: She is alert and oriented to person, place, and time.  Psychiatric:        Mood and Affect: Mood normal.        Behavior: Behavior normal.        Thought Content: Thought content normal.        Judgment: Judgment normal.      UC Treatments / Results  Labs (all labs ordered are listed, but only abnormal results are displayed) Labs Reviewed  CERVICOVAGINAL ANCILLARY ONLY    EKG   Radiology DG Chest 2 View Result Date: 04/03/2024 CLINICAL DATA:  Chest pain and shortness of breath. EXAM: CHEST - 2 VIEW COMPARISON:  01/16/2024 FINDINGS: The cardiomediastinal contours are normal. The lungs are clear. Pulmonary vasculature is normal. No consolidation, pleural effusion, or pneumothorax. No acute osseous abnormalities are seen. IMPRESSION: No active cardiopulmonary disease. Electronically Signed   By: Chadwick Colonel M.D.   On: 04/03/2024 18:27    Procedures Procedures (including critical care time)  Medications Ordered in UC Medications - No data to display  Initial Impression / Assessment and Plan / UC Course  I have reviewed the triage vital signs and the nursing notes.  Pertinent labs & imaging results that were available during my care of the patient were reviewed by me and considered in my medical decision  making (see chart for  details).     Patient presents requesting routine STI testing-sexually active in a monogamous relationship with a female partner for the past 8 months.  Does not routinely use contraception or protection.  Is not currently having any abnormal symptoms.  Will complete vaginal testing today-she is aware to follow-up with her primary care provider for serum/blood testing and with any future pregnancy concerns.  Recommend use of condoms routinely for STI prevention and prevention of unplanned pregnancies.  Final Clinical Impressions(s) / UC Diagnoses   Final diagnoses:  Encounter for screening examination for sexually transmitted infection     Discharge Instructions      Routine vaginal swab STI screening performed today.  Follow up with your primary care provider for additional blood/serum tests as needed.  Remember - use condoms to help prevent disease transmission and prevent unplanned pregnancies.     ED Prescriptions   None    PDMP not reviewed this encounter.   Genene Kennel, FNP 04/05/24 1212

## 2024-04-05 NOTE — Discharge Instructions (Signed)
 Routine vaginal swab STI screening performed today.  Follow up with your primary care provider for additional blood/serum tests as needed.  Remember - use condoms to help prevent disease transmission and prevent unplanned pregnancies.

## 2024-04-05 NOTE — ED Triage Notes (Signed)
 Pt reports needing to do routine STD screening, pregnancy test, pt would like both the swab and blood work.

## 2024-04-06 LAB — CERVICOVAGINAL ANCILLARY ONLY
Bacterial Vaginitis (gardnerella): POSITIVE — AB
Candida Glabrata: NEGATIVE
Candida Vaginitis: NEGATIVE
Chlamydia: NEGATIVE
Comment: NEGATIVE
Comment: NEGATIVE
Comment: NEGATIVE
Comment: NEGATIVE
Comment: NEGATIVE
Comment: NORMAL
Neisseria Gonorrhea: NEGATIVE
Trichomonas: NEGATIVE

## 2024-04-19 ENCOUNTER — Ambulatory Visit: Admitting: Adult Health

## 2024-05-17 ENCOUNTER — Other Ambulatory Visit: Payer: Self-pay | Admitting: Adult Health

## 2024-07-18 ENCOUNTER — Encounter: Payer: Self-pay | Admitting: Adult Health

## 2024-07-18 ENCOUNTER — Ambulatory Visit: Admitting: Adult Health

## 2024-07-18 ENCOUNTER — Other Ambulatory Visit (HOSPITAL_COMMUNITY)
Admission: RE | Admit: 2024-07-18 | Discharge: 2024-07-18 | Disposition: A | Discharge status: Home / Self Care | Source: Ambulatory Visit | Attending: Adult Health | Admitting: Adult Health

## 2024-07-18 ENCOUNTER — Encounter (INDEPENDENT_AMBULATORY_CARE_PROVIDER_SITE_OTHER): Payer: Self-pay | Admitting: *Deleted

## 2024-07-18 VITALS — BP 107/73 | HR 89 | Ht 64.0 in | Wt 237.5 lb

## 2024-07-18 DIAGNOSIS — R109 Unspecified abdominal pain: Secondary | ICD-10-CM

## 2024-07-18 DIAGNOSIS — Z124 Encounter for screening for malignant neoplasm of cervix: Secondary | ICD-10-CM | POA: Insufficient documentation

## 2024-07-18 DIAGNOSIS — R110 Nausea: Secondary | ICD-10-CM

## 2024-07-18 DIAGNOSIS — R102 Pelvic and perineal pain: Secondary | ICD-10-CM

## 2024-07-18 NOTE — Progress Notes (Signed)
  Subjective:     Patient ID: Patricia Horton, female   DOB: 1996-04-13, 28 y.o.   MRN: [id number]  HPI Leeyah is a 28 year old black female, with SO, [id number] in complaining of stomach and pelvic pain that comes and goes, and has nausea. She needs a pap.  Review of Systems +stomach and pelvic pain that comes and goes,  + nausea Denies problems with urination or bowel movements or sex Reviewed past medical,surgical, social and family history. Reviewed medications and allergies.     Objective:   Physical Exam BP 107/73 (BP Location: Left Arm, Patient Position: Sitting, Cuff Size: Large)   Pulse 89   Ht 5' 4 (1.626 m)   Wt 237 lb 8 oz (107.7 kg)   LMP 06/29/2024 (Approximate)   BMI 40.77 kg/m     Skin warm and dry.Pelvic: external genitalia is normal in appearance no lesions, vagina: white discharge without odor,urethra has no lesions or masses noted, cervix:smooth and bulbous,pap with HR HPV genotyping and GC/CHL performed, uterus: normal size, shape and contour, non tender, no masses felt, adnexa: no masses or tenderness noted. Bladder is non tender and no masses felt.abdomen is soft and non tender, no HSM noted.    Upstream - 07/18/24 1225       Pregnancy Intention Screening   Does the patient want to become pregnant in the next year? Ok Either Way    Does the patient's partner want to become pregnant in the next year? Ok Either Way    Would the patient like to discuss contraceptive options today? No      Contraception Wrap Up   Current Method No Method - Other Reason    End Method No Method - Other Reason    Contraception Counseling Provided No         Examination chaperoned by Clarita Salt LPN  Assessment:     1. Stomach pain (Primary) Has pain that comes and goes Will refer to GI  - Ambulatory referral to Gastroenterology  2. Pelvic pain No pain today Had US  in March IMPRESSION: Small cysts are noted in the periphery of the ovaries in string of pearl  appearance. This is nonspecific but can be seen in polycystic ovarian syndrome.  3. Nausea Will refer to GI  - Ambulatory referral to Gastroenterology     4. Routine pap smear Pap sent  Pap in 3 years if normal Plan:    Get PCP Physical in 1 year

## 2024-07-18 NOTE — Addendum Note (Signed)
 Addended by: NEYSA CLARITA RAMAN on: 07/18/2024 01:38 PM   Modules accepted: Orders

## 2024-07-21 LAB — CYTOLOGY - PAP
Chlamydia: NEGATIVE
Comment: NEGATIVE
Comment: NEGATIVE
Comment: NORMAL
Diagnosis: NEGATIVE
High risk HPV: NEGATIVE
Neisseria Gonorrhea: NEGATIVE

## 2024-07-24 ENCOUNTER — Ambulatory Visit: Payer: Self-pay | Admitting: Adult Health

## 2024-08-08 ENCOUNTER — Ambulatory Visit

## 2024-08-15 ENCOUNTER — Ambulatory Visit (INDEPENDENT_AMBULATORY_CARE_PROVIDER_SITE_OTHER): Admitting: Gastroenterology

## 2024-08-15 ENCOUNTER — Encounter (INDEPENDENT_AMBULATORY_CARE_PROVIDER_SITE_OTHER): Payer: Self-pay | Admitting: Gastroenterology

## 2024-08-15 VITALS — BP 100/70 | HR 72 | Temp 98.3°F | Ht 64.0 in | Wt 237.3 lb

## 2024-08-15 DIAGNOSIS — K5900 Constipation, unspecified: Secondary | ICD-10-CM

## 2024-08-15 DIAGNOSIS — R1319 Other dysphagia: Secondary | ICD-10-CM | POA: Insufficient documentation

## 2024-08-15 DIAGNOSIS — R1310 Dysphagia, unspecified: Secondary | ICD-10-CM | POA: Diagnosis not present

## 2024-08-15 DIAGNOSIS — R110 Nausea: Secondary | ICD-10-CM

## 2024-08-15 DIAGNOSIS — K581 Irritable bowel syndrome with constipation: Secondary | ICD-10-CM | POA: Insufficient documentation

## 2024-08-15 DIAGNOSIS — R1084 Generalized abdominal pain: Secondary | ICD-10-CM | POA: Insufficient documentation

## 2024-08-15 MED ORDER — LINACLOTIDE 145 MCG PO CAPS: 145.0000 ug | ORAL_CAPSULE | Freq: Every day | ORAL | Status: DC

## 2024-08-15 MED ORDER — POLYETHYLENE GLYCOL 3350 17 G PO PACK: 17.0000 g | PACK | Freq: Two times a day (BID) | ORAL | 2 refills | Status: AC

## 2024-08-15 MED ORDER — PANTOPRAZOLE SODIUM 40 MG PO TBEC: 40.0000 mg | DELAYED_RELEASE_TABLET | Freq: Every day | ORAL | 3 refills | Status: DC

## 2024-08-15 NOTE — Progress Notes (Signed)
 Abdulaziz Toman Faizan Tashunda Vandezande , M.D. Gastroenterology & Hepatology [facility] Gastroenterology [address] [place], [place] [address] Primary Care Physician: Pcp, No No address on file  Chief Complaint: Abdominal pain nausea, dysphagia and altered bowel movement  History of Present Illness: Patricia Horton is a 28 y.o. female with asthma who presents for evaluation of Abdominal pain nausea, dysphagia and altered bowel movement  Patient reports her symptoms has been going on for past couple months, she feels as if she is pregnant while she is not.  Patient reports bowel movement every other day with occasional straining or Bristol stool scale type I-II.  She reports generalized abdominal pain mostly  located upper abdomen and left side of the abdomen.  Patient reports nausea is baseline and severity intensity would change occasionally with food.  During the same time patient reports difficulty swallowing liquid and solids occasionally without any history of food impactions.The patient denies having any bloating  vomiting, fever, chills, hematochezia, melena, hematemesi,, diarrhea, jaundice, pruritus or weight loss.  Labs from 03/2024 normal liver enzymes alk phos 59 Hemoglobin 13 platelet 3 8 Last ZHI:wnwz Last Colonoscopy:none  FHx: neg for any gastrointestinal/liver disease, no malignancies Social: neg smoking, alcohol or illicit drug use Surgical: no abdominal surgeries  Past Medical History: Past Medical History:  Diagnosis Date   Asthma    Migraine with aura 2014   NO ESTROGEN   Seizures (HCC)    last seizure 2017    Past Surgical History: Past Surgical History:  Procedure Laterality Date   DILATION AND EVACUATION N/A 10/01/2020   Procedure: DILATATION AND EVACUATION;  Surgeon: Nicholaus Burnard CHRISTELLA, MD;  Location: [facility];  Service: Gynecology;  Laterality: N/A;   NO PAST SURGERIES      Family History: Family History  Problem Relation  Age of Onset   Cancer Mother        ovarian   Diabetes Mother    Stroke Mother    Hypertension Mother    Healthy Father    Heart disease Paternal Aunt    Irritable bowel syndrome Maternal Grandmother    Cancer Paternal Grandmother        breast   Heart disease Paternal Grandmother     Social History: Social History   Tobacco Use  Smoking Status Never  Smokeless Tobacco Never   Social History   Substance and Sexual Activity  Alcohol Use Not Currently   Comment: occ   Social History   Substance and Sexual Activity  Drug Use No    Allergies: No Known Allergies  Medications: Current Outpatient Medications  Medication Sig Dispense Refill   aspirin -acetaminophen -caffeine (EXCEDRIN MIGRAINE) 250-250-65 MG tablet Take by mouth every 6 (six) hours as needed for headache.     ondansetron  (ZOFRAN -ODT) 4 MG disintegrating tablet Take 1 tablet (4 mg total) by mouth every 8 (eight) hours as needed for nausea. 30 tablet 1   pantoprazole  (PROTONIX ) 40 MG tablet Take 1 tablet (40 mg total) by mouth daily. 60 tablet 3   polyethylene glycol (MIRALAX  / GLYCOLAX ) 17 g packet Take 17 g by mouth 2 (two) times daily. 60 packet 2   prenatal vitamin w/FE, FA (PRENATAL 1 + 1) 27-1 MG TABS tablet Take 1 tablet by mouth daily at 12 noon. 30 tablet 12   No current facility-administered medications for this visit.    Review of Systems: GENERAL: negative for malaise, night sweats HEENT: No changes in hearing or vision, no nose bleeds or  other nasal problems. NECK: Negative for lumps, goiter, pain and significant neck swelling RESPIRATORY: Negative for cough, wheezing CARDIOVASCULAR: Negative for chest pain, leg swelling, palpitations, orthopnea GI: SEE HPI MUSCULOSKELETAL: Negative for joint pain or swelling, back pain, and muscle pain. SKIN: Negative for lesions, rash HEMATOLOGY Negative for prolonged bleeding, bruising easily, and swollen nodes. ENDOCRINE: Negative for cold or heat  intolerance, polyuria, polydipsia and goiter. NEURO: negative for tremor, gait imbalance, syncope and seizures. The remainder of the review of systems is noncontributory.   Physical Exam: BP 100/70   Pulse 72   Temp 98.3 F (36.8 C)   Ht 5' 4 (1.626 m)   Wt 237 lb 4.8 oz (107.6 kg)   LMP 06/29/2024 (Approximate)   BMI 40.73 kg/m  GENERAL: The patient is AO x3, in no acute distress. HEENT: Head is normocephalic and atraumatic. EOMI are intact. Mouth is well hydrated and without lesions. NECK: Supple. No masses LUNGS: Clear to auscultation. No presence of rhonchi/wheezing/rales. Adequate chest expansion HEART: RRR, normal s1 and s2. ABDOMEN: Soft, epigastric and left abdomen tenderness, no guarding, no peritoneal signs, and nondistended. BS +. No masses.   Imaging/Labs: as above     Latest Ref Rng & Units 04/03/2024    6:08 PM 01/16/2024    1:24 PM 04/17/2023    6:41 PM  CBC  WBC 4.0 - 10.5 K/uL 7.4  5.3  8.4   Hemoglobin 12.0 - 15.0 g/dL 86.9  86.9  86.7   Hematocrit 36.0 - 46.0 % 41.0  38.9  39.6   Platelets 150 - 400 K/uL 380  293  354    No results found for: IRON , TIBC, FERRITIN  I personally reviewed and interpreted the available labs, imaging and endoscopic files.  2018  1.  Negative for acute process.  2.  Normal appendix.  3.  Mild diffuse urinary bladder wall thickening, for which I'm unable to exclude an underlying cystitis.  4.  Small bilateral ovarian cyst, likely physiologic in etiology.  5.  Moderate amount of colonic stool.   Impression and Plan:  Patricia Horton is a 28 y.o. female with asthma who presents for evaluation of Abdominal pain nausea, dysphagia and altered bowel movement  # Nausea # Altered bowel movements # Abdominal pain #Dysphagia  Patient has Bristol stool scale bowel movement 1-2 every other day denies abdominal pain this could be irritable bowel syndrome constipation type.  CT scan from 2023 in 2018 with moderate amount of  colonic stool on the left side where the patient pain is located  Persistent nausea could be uncontrolled GERD with underlying constipation as well  Although patient reports solid and liquid food dysphagia given young age and history of atopy (asthma) unable to rule out eosinophilic esophagitis without upper endoscopy  Protonix  40mg - advised to take 30 min before breakfast  GERD recommendations:  1) Avoid coffee, tea, cola beverages, carbonated beverages, spicy foods, greasy foods, foods high in acid content (e.g. tomatoes and citrus fruits), chocolate, and peppermint 2) Avoid drinking alcoholic beverages 3) Avoid smoking 4) Eat small meals and keep weight within normal range 5) Avoid recumbent posture for 3 hours post-prandially 6) Elevate head of bed  Ensure adequate fluid intake: Aim for 8 glasses of water  daily. Follow a high fiber diet: Include foods such as dates, prunes, pears, and kiwi. Take Miralax  twice a day for the first week, then reduce to once daily thereafter. Linzess  daily for 7 days   IB Guard 1-2 times daily for  pain   Right upper quadrant ultrasound  Will obtain lab work for celiac, alpha gal and stool sample for fecal calprotectin  Given dysphagia we discussed there is indication benefit and limitation to upper endoscopy with possible dilation for which patient is agreeable  I thoroughly discussed with the patient the procedure, including the risks involved. Patient understands what the procedure involves including the benefits and any risks. Patient understands alternatives to the proposed procedure. Risks including (but not limited to) bleeding, tearing of the lining (perforation), rupture of adjacent organs, problems with heart and lung function, infection, and medication reactions. A small percentage of complications may require surgery, hospitalization, repeat endoscopic procedure, and/or transfusion.  Patient understood and agreed.   All questions were  answered.      Patricia Monnin Faizan Minh Jasper, MD Gastroenterology and Hepatology [facility] Gastroenterology   This chart has been completed using [facility] Dictation software, and while attempts have been made to ensure accuracy , certain words and phrases may not be transcribed as intended

## 2024-08-15 NOTE — H&P (View-Only) (Signed)
 Patricia Horton , M.D. Gastroenterology & Hepatology [facility] Gastroenterology [address] [place], [place] [address] Primary Care Physician: Pcp, No No address on file  Chief Complaint: Abdominal pain nausea, dysphagia and altered bowel movement  History of Present Illness: Patricia Horton is a 28 y.o. female with asthma who presents for evaluation of Abdominal pain nausea, dysphagia and altered bowel movement  Patient reports her symptoms has been going on for past couple months, she feels as if she is pregnant while she is not.  Patient reports bowel movement every other day with occasional straining or Bristol stool scale type I-II.  She reports generalized abdominal pain mostly  located upper abdomen and left side of the abdomen.  Patient reports nausea is baseline and severity intensity would change occasionally with food.  During the same time patient reports difficulty swallowing liquid and solids occasionally without any history of food impactions.The patient denies having any bloating  vomiting, fever, chills, hematochezia, melena, hematemesi,, diarrhea, jaundice, pruritus or weight loss.  Labs from 03/2024 normal liver enzymes alk phos 59 Hemoglobin 13 platelet 3 8 Last ZHI:wnwz Last Colonoscopy:none  FHx: neg for any gastrointestinal/liver disease, no malignancies Social: neg smoking, alcohol or illicit drug use Surgical: no abdominal surgeries  Past Medical History: Past Medical History:  Diagnosis Date   Asthma    Migraine with aura 2014   NO ESTROGEN   Seizures (HCC)    last seizure 2017    Past Surgical History: Past Surgical History:  Procedure Laterality Date   DILATION AND EVACUATION N/A 10/01/2020   Procedure: DILATATION AND EVACUATION;  Surgeon: Nicholaus Burnard CHRISTELLA, MD;  Location: [facility];  Service: Gynecology;  Laterality: N/A;   NO PAST SURGERIES      Family History: Family History  Problem Relation  Age of Onset   Cancer Mother        ovarian   Diabetes Mother    Stroke Mother    Hypertension Mother    Healthy Father    Heart disease Paternal Aunt    Irritable bowel syndrome Maternal Grandmother    Cancer Paternal Grandmother        breast   Heart disease Paternal Grandmother     Social History: Social History   Tobacco Use  Smoking Status Never  Smokeless Tobacco Never   Social History   Substance and Sexual Activity  Alcohol Use Not Currently   Comment: occ   Social History   Substance and Sexual Activity  Drug Use No    Allergies: No Known Allergies  Medications: Current Outpatient Medications  Medication Sig Dispense Refill   aspirin -acetaminophen -caffeine (EXCEDRIN MIGRAINE) 250-250-65 MG tablet Take by mouth every 6 (six) hours as needed for headache.     ondansetron  (ZOFRAN -ODT) 4 MG disintegrating tablet Take 1 tablet (4 mg total) by mouth every 8 (eight) hours as needed for nausea. 30 tablet 1   pantoprazole  (PROTONIX ) 40 MG tablet Take 1 tablet (40 mg total) by mouth daily. 60 tablet 3   polyethylene glycol (MIRALAX  / GLYCOLAX ) 17 g packet Take 17 g by mouth 2 (two) times daily. 60 packet 2   prenatal vitamin w/FE, FA (PRENATAL 1 + 1) 27-1 MG TABS tablet Take 1 tablet by mouth daily at 12 noon. 30 tablet 12   No current facility-administered medications for this visit.    Review of Systems: GENERAL: negative for malaise, night sweats HEENT: No changes in hearing or vision, no nose bleeds or  other nasal problems. NECK: Negative for lumps, goiter, pain and significant neck swelling RESPIRATORY: Negative for cough, wheezing CARDIOVASCULAR: Negative for chest pain, leg swelling, palpitations, orthopnea GI: SEE HPI MUSCULOSKELETAL: Negative for joint pain or swelling, back pain, and muscle pain. SKIN: Negative for lesions, rash HEMATOLOGY Negative for prolonged bleeding, bruising easily, and swollen nodes. ENDOCRINE: Negative for cold or heat  intolerance, polyuria, polydipsia and goiter. NEURO: negative for tremor, gait imbalance, syncope and seizures. The remainder of the review of systems is noncontributory.   Physical Exam: BP 100/70   Pulse 72   Temp 98.3 F (36.8 C)   Ht 5' 4 (1.626 m)   Wt 237 lb 4.8 oz (107.6 kg)   LMP 06/29/2024 (Approximate)   BMI 40.73 kg/m  GENERAL: The patient is AO x3, in no acute distress. HEENT: Head is normocephalic and atraumatic. EOMI are intact. Mouth is well hydrated and without lesions. NECK: Supple. No masses LUNGS: Clear to auscultation. No presence of rhonchi/wheezing/rales. Adequate chest expansion HEART: RRR, normal s1 and s2. ABDOMEN: Soft, epigastric and left abdomen tenderness, no guarding, no peritoneal signs, and nondistended. BS +. No masses.   Imaging/Labs: as above     Latest Ref Rng & Units 04/03/2024    6:08 PM 01/16/2024    1:24 PM 04/17/2023    6:41 PM  CBC  WBC 4.0 - 10.5 K/uL 7.4  5.3  8.4   Hemoglobin 12.0 - 15.0 g/dL 86.9  86.9  86.7   Hematocrit 36.0 - 46.0 % 41.0  38.9  39.6   Platelets 150 - 400 K/uL 380  293  354    No results found for: IRON , TIBC, FERRITIN  I personally reviewed and interpreted the available labs, imaging and endoscopic files.  2018  1.  Negative for acute process.  2.  Normal appendix.  3.  Mild diffuse urinary bladder wall thickening, for which I'm unable to exclude an underlying cystitis.  4.  Small bilateral ovarian cyst, likely physiologic in etiology.  5.  Moderate amount of colonic stool.   Impression and Plan:  Patricia Horton is a 28 y.o. female with asthma who presents for evaluation of Abdominal pain nausea, dysphagia and altered bowel movement  # Nausea # Altered bowel movements # Abdominal pain #Dysphagia  Patient has Bristol stool scale bowel movement 1-2 every other day denies abdominal pain this could be irritable bowel syndrome constipation type.  CT scan from 2023 in 2018 with moderate amount of  colonic stool on the left side where the patient pain is located  Persistent nausea could be uncontrolled GERD with underlying constipation as well  Although patient reports solid and liquid food dysphagia given young age and history of atopy (asthma) unable to rule out eosinophilic esophagitis without upper endoscopy  Protonix  40mg - advised to take 30 min before breakfast  GERD recommendations:  1) Avoid coffee, tea, cola beverages, carbonated beverages, spicy foods, greasy foods, foods high in acid content (e.g. tomatoes and citrus fruits), chocolate, and peppermint 2) Avoid drinking alcoholic beverages 3) Avoid smoking 4) Eat small meals and keep weight within normal range 5) Avoid recumbent posture for 3 hours post-prandially 6) Elevate head of bed  Ensure adequate fluid intake: Aim for 8 glasses of water  daily. Follow a high fiber diet: Include foods such as dates, prunes, pears, and kiwi. Take Miralax  twice a day for the first week, then reduce to once daily thereafter. Linzess  daily for 7 days   IB Guard 1-2 times daily for  pain   Right upper quadrant ultrasound  Will obtain lab work for celiac, alpha gal and stool sample for fecal calprotectin  Given dysphagia we discussed there is indication benefit and limitation to upper endoscopy with possible dilation for which patient is agreeable  I thoroughly discussed with the patient the procedure, including the risks involved. Patient understands what the procedure involves including the benefits and any risks. Patient understands alternatives to the proposed procedure. Risks including (but not limited to) bleeding, tearing of the lining (perforation), rupture of adjacent organs, problems with heart and lung function, infection, and medication reactions. A small percentage of complications may require surgery, hospitalization, repeat endoscopic procedure, and/or transfusion.  Patient understood and agreed.   All questions were  answered.      Cray Monnin Faizan Minh Jasper, MD Gastroenterology and Hepatology [facility] Gastroenterology   This chart has been completed using [facility] Dictation software, and while attempts have been made to ensure accuracy , certain words and phrases may not be transcribed as intended

## 2024-08-15 NOTE — Telephone Encounter (Signed)
 Spoke with patient, scheduled EGD/DIL for 09/04/2024 at 2:30pm. Instructions sent to [facility]. Patient also made aware of ultrasound appointment at [facility] on 08/23/2024 at 8:30am and pt is also aware to go to [facility] lab on 09/01/2024 between 8:30-4.   PA tracking #:[id number]

## 2024-08-15 NOTE — Patient Instructions (Addendum)
 It was very nice to meet you today, as dicussed with will plan for the following :  1) Labwork and stool samples  2) Ultrasound  3) upper endoscopy   4) Protonix  40mg - advised to take 30 min before breakfast  GERD recommendations:  1) Avoid coffee, tea, cola beverages, carbonated beverages, spicy foods, greasy foods, foods high in acid content (e.g. tomatoes and citrus fruits), chocolate, and peppermint 2) Avoid drinking alcoholic beverages 3) Avoid smoking 4) Eat small meals and keep weight within normal range 5) Avoid recumbent posture for 3 hours post-prandially 6) Elevate head of bed  5) Ensure adequate fluid intake: Aim for 8 glasses of water  daily. Follow a high fiber diet: Include foods such as dates, prunes, pears, and kiwi. Take Miralax  twice a day for the first week, then reduce to once daily thereafter. Linzess  daily for 7 days   6) IB Guard 1-2 times daily for pain

## 2024-08-17 ENCOUNTER — Telehealth (INDEPENDENT_AMBULATORY_CARE_PROVIDER_SITE_OTHER): Payer: Self-pay

## 2024-08-17 NOTE — Telephone Encounter (Signed)
 Pa for EGD: [facility] Authorization number: [id number] Dates of service: 09/04/2024-10/29/2024

## 2024-08-21 ENCOUNTER — Telehealth (INDEPENDENT_AMBULATORY_CARE_PROVIDER_SITE_OTHER): Payer: Self-pay | Admitting: Gastroenterology

## 2024-08-21 ENCOUNTER — Encounter (INDEPENDENT_AMBULATORY_CARE_PROVIDER_SITE_OTHER): Payer: Self-pay

## 2024-08-21 NOTE — Telephone Encounter (Signed)
 Pt left voicemail stating that she is suppose to have lab work and a stool sample but the medications are not working. Pt has also brought Miralax .  Returned call to patient but had to leave voicemail to return call. Also sent my chart message.

## 2024-08-21 NOTE — Telephone Encounter (Signed)
 Pt replied via my chart. Please see my chart message from today

## 2024-08-23 ENCOUNTER — Ambulatory Visit (HOSPITAL_COMMUNITY)
Admission: RE | Admit: 2024-08-23 | Discharge: 2024-08-23 | Disposition: A | Discharge status: Home / Self Care | Source: Ambulatory Visit | Attending: Gastroenterology | Admitting: Gastroenterology

## 2024-08-23 DIAGNOSIS — R1084 Generalized abdominal pain: Secondary | ICD-10-CM | POA: Insufficient documentation

## 2024-08-23 DIAGNOSIS — R109 Unspecified abdominal pain: Secondary | ICD-10-CM | POA: Diagnosis not present

## 2024-08-23 DIAGNOSIS — R160 Hepatomegaly, not elsewhere classified: Secondary | ICD-10-CM | POA: Diagnosis not present

## 2024-08-23 DIAGNOSIS — R110 Nausea: Secondary | ICD-10-CM | POA: Insufficient documentation

## 2024-08-25 ENCOUNTER — Encounter (INDEPENDENT_AMBULATORY_CARE_PROVIDER_SITE_OTHER): Payer: Self-pay

## 2024-08-25 ENCOUNTER — Ambulatory Visit (INDEPENDENT_AMBULATORY_CARE_PROVIDER_SITE_OTHER): Payer: Self-pay | Admitting: Gastroenterology

## 2024-08-25 NOTE — Progress Notes (Signed)
 Hi Patricia Horton ,  Can you please call the patient and tell the patient the ultrasound shows a lesion in your liver which is similar to previously seen 2022 and radiologist suggested to be hemangioma which is benign finding and a blood vessel which is nothing to be worried about . We may get MRI in future   Thanks,  Tamekia Rotter Faizan Tailer Volkert, MD Gastroenterology and Hepatology [facility] Gastroenterology  ============  Ultrasound   IMPRESSION: No sonographic etiology for abdominal pain identified. Similar appearance of an echogenic mass within the RIGHT liver dating back to 2022, most consistent with a benign hemangioma.

## 2024-08-30 ENCOUNTER — Telehealth (INDEPENDENT_AMBULATORY_CARE_PROVIDER_SITE_OTHER): Payer: Self-pay

## 2024-08-30 NOTE — Telephone Encounter (Signed)
 Pt left VM stating she was cancelling procedure bc her job would not allow her to be off. Needs to be rescheduled. [phone]

## 2024-08-30 NOTE — Telephone Encounter (Signed)
 Received a message that pt is needing to cancel procedure.  Patricia Horton, Patricia Horton, [place] Hey,  I just got a message from this patient that she is not going to be able to have her procedure on Monday due to work will not let her off so she needs to reschedule.  Please give her a call.  I will put the case in the depot for now just let me know where she moves to.  Thanks  I ATC pt, no answer. LVM for call back.

## 2024-08-31 ENCOUNTER — Encounter (INDEPENDENT_AMBULATORY_CARE_PROVIDER_SITE_OTHER): Payer: Self-pay

## 2024-08-31 NOTE — Telephone Encounter (Signed)
 Patient called stating she would like to keep her original appoint on 10/6 for her EGD. Will send a message to ENDO.

## 2024-09-01 ENCOUNTER — Other Ambulatory Visit (HOSPITAL_COMMUNITY)
Admission: RE | Admit: 2024-09-01 | Discharge: 2024-09-01 | Disposition: A | Discharge status: Home / Self Care | Source: Ambulatory Visit | Attending: Gastroenterology | Admitting: Gastroenterology

## 2024-09-01 ENCOUNTER — Other Ambulatory Visit (INDEPENDENT_AMBULATORY_CARE_PROVIDER_SITE_OTHER): Payer: Self-pay | Admitting: Gastroenterology

## 2024-09-01 DIAGNOSIS — R1319 Other dysphagia: Secondary | ICD-10-CM | POA: Insufficient documentation

## 2024-09-01 DIAGNOSIS — R1084 Generalized abdominal pain: Secondary | ICD-10-CM | POA: Insufficient documentation

## 2024-09-01 DIAGNOSIS — R110 Nausea: Secondary | ICD-10-CM | POA: Insufficient documentation

## 2024-09-01 LAB — C-REACTIVE PROTEIN: CRP: 0.7 mg/dL (ref <1.0)

## 2024-09-01 LAB — PREGNANCY, URINE: Preg Test, Ur: NEGATIVE

## 2024-09-01 LAB — FERRITIN: Ferritin: 69 ng/mL (ref 11–307)

## 2024-09-02 LAB — IGA: IgA: 256 mg/dL (ref 87–352)

## 2024-09-04 ENCOUNTER — Ambulatory Visit (HOSPITAL_BASED_OUTPATIENT_CLINIC_OR_DEPARTMENT_OTHER): Admitting: Certified Registered"

## 2024-09-04 ENCOUNTER — Ambulatory Visit (HOSPITAL_COMMUNITY): Admitting: Certified Registered"

## 2024-09-04 ENCOUNTER — Other Ambulatory Visit: Payer: Self-pay

## 2024-09-04 ENCOUNTER — Encounter (HOSPITAL_COMMUNITY): Payer: Self-pay | Admitting: Gastroenterology

## 2024-09-04 ENCOUNTER — Encounter (HOSPITAL_COMMUNITY): Admission: RE | Payer: Self-pay | Source: Home / Self Care

## 2024-09-04 ENCOUNTER — Encounter (HOSPITAL_COMMUNITY)
Admission: RE | Disposition: A | Discharge status: Home / Self Care | Payer: Self-pay | Source: Home / Self Care | Attending: Gastroenterology

## 2024-09-04 ENCOUNTER — Ambulatory Visit (HOSPITAL_COMMUNITY): Admission: RE | Admit: 2024-09-04 | Source: Home / Self Care | Admitting: Gastroenterology

## 2024-09-04 ENCOUNTER — Ambulatory Visit (HOSPITAL_COMMUNITY)
Admission: RE | Admit: 2024-09-04 | Discharge: 2024-09-04 | Disposition: A | Discharge status: Home / Self Care | Attending: Gastroenterology | Admitting: Gastroenterology

## 2024-09-04 DIAGNOSIS — R1310 Dysphagia, unspecified: Secondary | ICD-10-CM | POA: Insufficient documentation

## 2024-09-04 DIAGNOSIS — Z6841 Body Mass Index (BMI) 40.0 and over, adult: Secondary | ICD-10-CM | POA: Diagnosis not present

## 2024-09-04 DIAGNOSIS — R1084 Generalized abdominal pain: Secondary | ICD-10-CM | POA: Diagnosis not present

## 2024-09-04 DIAGNOSIS — J45909 Unspecified asthma, uncomplicated: Secondary | ICD-10-CM | POA: Insufficient documentation

## 2024-09-04 DIAGNOSIS — K2970 Gastritis, unspecified, without bleeding: Secondary | ICD-10-CM

## 2024-09-04 DIAGNOSIS — K2100 Gastro-esophageal reflux disease with esophagitis, without bleeding: Secondary | ICD-10-CM | POA: Diagnosis not present

## 2024-09-04 DIAGNOSIS — K449 Diaphragmatic hernia without obstruction or gangrene: Secondary | ICD-10-CM

## 2024-09-04 DIAGNOSIS — K222 Esophageal obstruction: Secondary | ICD-10-CM | POA: Diagnosis not present

## 2024-09-04 DIAGNOSIS — K2950 Unspecified chronic gastritis without bleeding: Secondary | ICD-10-CM | POA: Diagnosis not present

## 2024-09-04 DIAGNOSIS — K209 Esophagitis, unspecified without bleeding: Secondary | ICD-10-CM

## 2024-09-04 DIAGNOSIS — K3189 Other diseases of stomach and duodenum: Secondary | ICD-10-CM

## 2024-09-04 DIAGNOSIS — R109 Unspecified abdominal pain: Secondary | ICD-10-CM | POA: Diagnosis not present

## 2024-09-04 DIAGNOSIS — R569 Unspecified convulsions: Secondary | ICD-10-CM | POA: Diagnosis not present

## 2024-09-04 HISTORY — PX: ESOPHAGOGASTRODUODENOSCOPY: SHX5428

## 2024-09-04 HISTORY — PX: ESOPHAGEAL DILATION: SHX303

## 2024-09-04 LAB — MISC LABCORP TEST (SEND OUT): Labcorp test code: 650003

## 2024-09-04 LAB — GLIADIN ANTIBODIES, SERUM
Antigliadin Abs, IgA: 6 U (ref 0–19)
Gliadin IgG: 1 U (ref 0–19)

## 2024-09-04 LAB — TISSUE TRANSGLUTAMINASE, IGA: Tissue Transglutaminase Ab, IgA: <2 U/mL (ref 0–3)

## 2024-09-04 SURGERY — EGD (ESOPHAGOGASTRODUODENOSCOPY)
Anesthesia: General

## 2024-09-04 SURGERY — EGD (ESOPHAGOGASTRODUODENOSCOPY)
Anesthesia: Choice

## 2024-09-04 MED ORDER — LINACLOTIDE 290 MCG PO CAPS: 290.0000 ug | ORAL_CAPSULE | Freq: Every day | ORAL | 1 refills | Status: AC

## 2024-09-04 MED ORDER — PROPOFOL 10 MG/ML IV BOLUS
INTRAVENOUS | Status: DC | PRN
  Administered 2024-09-04: 80 mg via INTRAVENOUS
  Administered 2024-09-04: 20 mg via INTRAVENOUS
  Administered 2024-09-04: 125 ug/kg/min via INTRAVENOUS
  Administered 2024-09-04: 30 mg via INTRAVENOUS

## 2024-09-04 MED ORDER — PANTOPRAZOLE SODIUM 40 MG PO TBEC: 40.0000 mg | DELAYED_RELEASE_TABLET | Freq: Every day | ORAL | 1 refills | Status: AC

## 2024-09-04 MED ORDER — LIDOCAINE 2% (20 MG/ML) 5 ML SYRINGE
INTRAMUSCULAR | Status: DC | PRN
  Administered 2024-09-04: 60 mg via INTRAVENOUS

## 2024-09-04 MED ORDER — DEXMEDETOMIDINE HCL IN NACL 80 MCG/20ML IV SOLN
INTRAVENOUS | Status: DC | PRN
  Administered 2024-09-04: 4 ug via INTRAVENOUS

## 2024-09-04 MED ORDER — LACTATED RINGERS IV SOLN: INTRAVENOUS | Status: DC

## 2024-09-04 MED ORDER — LACTATED RINGERS IV SOLN: INTRAVENOUS | Status: DC | PRN

## 2024-09-04 NOTE — Anesthesia Preprocedure Evaluation (Signed)
 Anesthesia Evaluation  Patient identified by MRN, date of birth, ID band Patient awake    Reviewed: Allergy & Precautions, H&P , NPO status , Patient's Chart, lab work & pertinent test results  Airway Mallampati: II  TM Distance: >3 FB Neck ROM: Full    Dental no notable dental hx.    Pulmonary asthma    Pulmonary exam normal breath sounds clear to auscultation       Cardiovascular negative cardio ROS Normal cardiovascular exam Rhythm:Regular Rate:Normal     Neuro/Psych  Headaches, Seizures -,   negative psych ROS   GI/Hepatic negative GI ROS, Neg liver ROS,,,  Endo/Other  negative endocrine ROS    Renal/GU negative Renal ROS  negative genitourinary   Musculoskeletal negative musculoskeletal ROS (+)    Abdominal   Peds negative pediatric ROS (+)  Hematology negative hematology ROS (+)   Anesthesia Other Findings   Reproductive/Obstetrics negative OB ROS                              Anesthesia Physical Anesthesia Plan  ASA: 2  Anesthesia Plan: General   Post-op Pain Management:    Induction: Intravenous  PONV Risk Score and Plan:   Airway Management Planned: Nasal Cannula  Additional Equipment:   Intra-op Plan:   Post-operative Plan:   Informed Consent: I have reviewed the patients History and Physical, chart, labs and discussed the procedure including the risks, benefits and alternatives for the proposed anesthesia with the patient or authorized representative who has indicated his/her understanding and acceptance.     Dental advisory given  Plan Discussed with: CRNA  Anesthesia Plan Comments:         Anesthesia Quick Evaluation

## 2024-09-04 NOTE — Transfer of Care (Addendum)
 Immediate Anesthesia Transfer of Care Note  Patient: Patricia Horton  Procedure(s) Performed: EGD (ESOPHAGOGASTRODUODENOSCOPY) DILATION, ESOPHAGUS  Patient Location: Endoscopy Unit  Anesthesia Type:General  Level of Consciousness: drowsy and patient cooperative  Airway & Oxygen Therapy: Patient Spontanous Breathing  Post-op Assessment: Report given to RN and Post -op Vital signs reviewed and stable  Post vital signs: Reviewed and stable  Last Vitals:  Vitals Value Taken Time  BP 101/52 09/04/24   1416  Temp 36.5 09/04/24   1416  Pulse 93 09/03/24   1416  Resp 21 09/03/24   1416  SpO2 98% 09/03/24   1416    Last Pain:  Vitals:   09/04/24 1355  TempSrc:   PainSc: 8          Complications: No notable events documented.

## 2024-09-04 NOTE — Anesthesia Postprocedure Evaluation (Signed)
 Anesthesia Post Note  Patient: Patricia Horton  Procedure(s) Performed: EGD (ESOPHAGOGASTRODUODENOSCOPY) DILATION, ESOPHAGUS  Patient location during evaluation: PACU Anesthesia Type: General Level of consciousness: awake and alert Pain management: pain level controlled Vital Signs Assessment: post-procedure vital signs reviewed and stable Respiratory status: spontaneous breathing, nonlabored ventilation, respiratory function stable and patient connected to nasal cannula oxygen Cardiovascular status: blood pressure returned to baseline and stable Postop Assessment: no apparent nausea or vomiting Anesthetic complications: no   No notable events documented.   Last Vitals:  Vitals:   09/04/24 1332 09/04/24 1416  BP: 120/78 (!) 101/52  Pulse: 71 92  Resp: 11 (!) 21  Temp: 36.9 C 36.5 C  SpO2: 98% 98%    Last Pain:  Vitals:   09/04/24 1416  TempSrc: Oral  PainSc: 0-No pain                 Andrea Limes

## 2024-09-04 NOTE — Discharge Instructions (Signed)
  Discharge instructions Please read the instructions outlined below and refer to this sheet in the next few weeks. These discharge instructions provide you with general information on caring for yourself after you leave the hospital. Your doctor may also give you specific instructions. While your treatment has been planned according to the most current medical practices available, unavoidable complications occasionally occur. If you have any problems or questions after discharge, please call your doctor. ACTIVITY You may resume your regular activity but move at a slower pace for the next 24 hours.  Take frequent rest periods for the next 24 hours.  Walking will help expel (get rid of) the air and reduce the bloated feeling in your abdomen.  No driving for 24 hours (because of the anesthesia (medicine) used during the test).  You may shower.  Do not sign any important legal documents or operate any machinery for 24 hours (because of the anesthesia used during the test).  NUTRITION Drink plenty of fluids.  You may resume your normal diet.  Begin with a light meal and progress to your normal diet.  Avoid alcoholic beverages for 24 hours or as instructed by your caregiver.  MEDICATIONS You may resume your normal medications unless your caregiver tells you otherwise.  WHAT YOU CAN EXPECT TODAY You may experience abdominal discomfort such as a feeling of fullness or "gas" pains.  FOLLOW-UP Your doctor will discuss the results of your test with you.  SEEK IMMEDIATE MEDICAL ATTENTION IF ANY OF THE FOLLOWING OCCUR: Excessive nausea (feeling sick to your stomach) and/or vomiting.  Severe abdominal pain and distention (swelling).  Trouble swallowing.  Temperature over 101 F (37.8 C).  Rectal bleeding or vomiting of blood.     Take protonix  40mg  ,30 min before breakfast Linzess  290mcg daily IB Guard 1-2 times daily   Avoid  using high dose aspirin  including Goody/BC powders, NSAIDs such as  Aleve , ibuprofen , naproxen , Motrin , Voltaren or Advil  (even the topical ones)    I hope you have a great rest of your week!   Khameron Gruenwald Faizan Taura Lamarre , M.D.. Gastroenterology and Hepatology [facility]

## 2024-09-04 NOTE — Anesthesia Procedure Notes (Signed)
 Date/Time: 09/04/2024 1:56 PM  Performed by: Para Jerelene CROME, CRNAOxygen Delivery Method: Nasal cannula Comments: OptiFlow Nasal Cannula.

## 2024-09-04 NOTE — Interval H&P Note (Signed)
 History and Physical Interval Note:  09/04/2024 1:26 PM  Patricia Horton  has presented today for surgery, with the diagnosis of abdominal pain, dysphagia.  The various methods of treatment have been discussed with the patient and family. After consideration of risks, benefits and other options for treatment, the patient has consented to  Procedure(s) with comments: EGD (ESOPHAGOGASTRODUODENOSCOPY) (N/A) - 2:30pm, ASA 1-2 DILATION, ESOPHAGUS (N/A) as a surgical intervention.  The patient's history has been reviewed, patient examined, no change in status, stable for surgery.  I have reviewed the patient's chart and labs.  Questions were answered to the patient's satisfaction.     Deatrice FALCON Magalene Mclear

## 2024-09-05 ENCOUNTER — Ambulatory Visit (INDEPENDENT_AMBULATORY_CARE_PROVIDER_SITE_OTHER): Payer: Self-pay | Admitting: Gastroenterology

## 2024-09-05 NOTE — Op Note (Signed)
 [facility] Patient Name: Patricia Horton Procedure Date: 09/04/2024 1:23 PM MRN: [id number] Date of Birth: March 09, 1996 Attending MD: Deatrice Dine , MD, [id number] CSN: [id number] Age: 28 Admit Type: Outpatient Procedure:                Upper GI endoscopy Indications:              Generalized abdominal pain, Dysphagia Providers:                Deatrice Dine, MD, Leandrew Edelman RN, RN, Bascom Blush Referring MD:              Medicines:                Monitored Anesthesia Care Complications:            No immediate complications. Estimated Blood Loss:     Estimated blood loss: none. Estimated blood loss                            was minimal. Procedure:                Pre-Anesthesia Assessment:                           - Prior to the procedure, a History and Physical                            was performed, and patient medications and                            allergies were reviewed. The patient's tolerance of                            previous anesthesia was also reviewed. The risks                            and benefits of the procedure and the sedation                            options and risks were discussed with the patient.                            All questions were answered, and informed consent                            was obtained. Prior Anticoagulants: The patient has                            taken no anticoagulant or antiplatelet agents                            except for NSAID medication. ASA Grade Assessment:                            II - A patient with mild systemic disease. After  reviewing the risks and benefits, the patient was                            deemed in satisfactory condition to undergo the                            procedure.                           After obtaining informed consent, the endoscope was                            passed under direct vision. Throughout the                            procedure,  the patient's blood pressure, pulse, and                            oxygen saturations were monitored continuously. The                            [id number] ([id number]) Upper was introduced through                            the mouth, and advanced to the second part of                            duodenum. The upper GI endoscopy was accomplished                            without difficulty. The patient tolerated the                            procedure well. Scope In: 2:01:21 PM Scope Out: 2:09:51 PM Total Procedure Duration: 0 hours 8 minutes 30 seconds  Findings:      A non-obstructing Schatzki ring was found in the lower third of the       esophagus. A TTS dilator was passed through the scope. Dilation with a       15-16.5-18 mm balloon dilator was performed to 18 mm. The dilation site       was examined following endoscope reinsertion and showed mild mucosal       disruption. Biopsies were obtained from the proximal and distal       esophagus with cold forceps for histology of suspected eosinophilic       esophagitis.      LA Grade A (one or more mucosal breaks less than 5 mm, not extending       between tops of 2 mucosal folds) esophagitis with no bleeding was found       at the gastroesophageal junction.      A 3 cm hiatal hernia was present.      The gastroesophageal flap valve was visualized endoscopically and       classified as Hill Grade IV (no fold, wide open lumen, hiatal hernia       present).      Mild inflammation characterized by erosions  was found in the stomach.       Biopsies were taken with a cold forceps for histology.      The duodenal bulb and second portion of the duodenum were normal.       Biopsies were taken with a cold forceps for histology. Impression:               - Non-obstructing Schatzki ring. Dilated.                           - LA Grade A reflux esophagitis with no bleeding.                           - 3 cm hiatal hernia.                           -  Gastroesophageal flap valve classified as Hill                            Grade IV (no fold, wide open lumen, hiatal hernia                            present).                           - Gastritis. Biopsied.                           - Normal duodenal bulb and second portion of the                            duodenum. Biopsied.                           - Biopsies were taken with a cold forceps for                            evaluation of eosinophilic esophagitis. Moderate Sedation:      Per Anesthesia Care Recommendation:           - Patient has a contact number available for                            emergencies. The signs and symptoms of potential                            delayed complications were discussed with the                            patient. Return to normal activities tomorrow.                            Written discharge instructions were provided to the                            patient.                           -  Resume previous diet.                           - Continue present medications.                           - Await pathology results.                           -Linzess                            -protonix  40mg  daily                           -Avoid NSAID                           -Fecal calprotectin                           -If patient continues to have pain despite above,                            may recommend colonoscopy to evaluate for IBD Procedure Code(s):        --- Professional ---                           [phone], Esophagogastroduodenoscopy, flexible,                            transoral; with transendoscopic balloon dilation of                            esophagus (less than 30 mm diameter)                           43239, 59, Esophagogastroduodenoscopy, flexible,                            transoral; with biopsy, single or multiple Diagnosis Code(s):        --- Professional ---                           K22.2, Esophageal  obstruction                           K21.00, Gastro-esophageal reflux disease with                            esophagitis, without bleeding                           K44.9, Diaphragmatic hernia without obstruction or                            gangrene  K29.70, Gastritis, unspecified, without bleeding                           R10.84, Generalized abdominal pain                           R13.10, Dysphagia, unspecified CPT copyright 2022 American Medical Association. All rights reserved. The codes documented in this report are preliminary and upon coder review may  be revised to meet current compliance requirements. Deatrice Dine, MD Deatrice Dine, MD 09/04/2024 2:17:41 PM This report has been signed electronically. Number of Addenda: 0

## 2024-09-06 LAB — CELIAC DISEASE PANEL

## 2024-09-06 LAB — SURGICAL PATHOLOGY

## 2024-09-07 ENCOUNTER — Ambulatory Visit (INDEPENDENT_AMBULATORY_CARE_PROVIDER_SITE_OTHER): Payer: Self-pay | Admitting: Gastroenterology

## 2024-09-08 ENCOUNTER — Encounter (HOSPITAL_COMMUNITY): Payer: Self-pay | Admitting: Gastroenterology

## 2024-09-15 NOTE — Progress Notes (Signed)
 Patient result letter mailed

## 2024-09-20 ENCOUNTER — Ambulatory Visit (INDEPENDENT_AMBULATORY_CARE_PROVIDER_SITE_OTHER): Admitting: Family Medicine

## 2024-09-20 ENCOUNTER — Encounter: Payer: Self-pay | Admitting: Family Medicine

## 2024-09-20 VITALS — BP 92/60 | HR 77 | Temp 98.1°F | Ht 64.0 in | Wt 243.4 lb

## 2024-09-20 DIAGNOSIS — G43809 Other migraine, not intractable, without status migrainosus: Secondary | ICD-10-CM

## 2024-09-20 DIAGNOSIS — N926 Irregular menstruation, unspecified: Secondary | ICD-10-CM

## 2024-09-20 DIAGNOSIS — Z6841 Body Mass Index (BMI) 40.0 and over, adult: Secondary | ICD-10-CM | POA: Diagnosis not present

## 2024-09-20 DIAGNOSIS — Z23 Encounter for immunization: Secondary | ICD-10-CM | POA: Diagnosis not present

## 2024-09-20 DIAGNOSIS — R198 Other specified symptoms and signs involving the digestive system and abdomen: Secondary | ICD-10-CM

## 2024-09-20 DIAGNOSIS — K219 Gastro-esophageal reflux disease without esophagitis: Secondary | ICD-10-CM | POA: Diagnosis not present

## 2024-09-20 NOTE — Progress Notes (Signed)
 New Patient Office Visit  Subjective    Patient ID: Patricia Horton, female    DOB: 02-19-1996  Age: 28 y.o. MRN: [id number]  CC:  Chief Complaint  Patient presents with   Establish Care   Gastroesophageal Reflux    Patient reports that she had an Endoscopy about 2 weeks ago at [facility] because she was having chest pains and was told that her results came back with her having a bad case of acid reflux. Was prescribed medication but is having issues with [facility] right now so she hasn't been able to pick up the medication.   Weight Loss    Would like to discuss options for losing weight    HPI [name] presents to establish care.  Denies any concerns.   Denies any significant medical hx other than recent GI concerns that she has been seeing GI for.   Is requesting referral for weight management discussion.   Patient had an endoscopy on 09/04/24 due to CC of nausea, dysphagia, and altered bowel movement.    Lab work negative for celiac, alpha gal.  GI doctor note stataed, small bowel biopsy shows some increase white cells In the lining of the small bowel and can be seen in patients taking pain killers like high dose aspirin  including Goody/BC powders, NSAIDs such as Aleve , ibuprofen , naproxen , Motrin , Voltaren or Advil  (even the topical ones), which I would avoid.   Linzess  and protonix  prescribed by GI. Patient reports that she has not picked it up yet but plans on doing it today.   Hx of migraines - reports going to neurology a couple years ago and she does not feel that they gave her much information.  She says that she has been using Excedrin about every other day for headaches.   Reports having OBGYN doctor since age 53 due to irregular bleeding.      Outpatient Encounter Medications as of 09/20/2024  Medication Sig   linaclotide  (LINZESS ) 290 MCG CAPS capsule Take 1 capsule (290 mcg total) by mouth daily before breakfast.   ondansetron  (ZOFRAN -ODT) 4  MG disintegrating tablet Take 1 tablet (4 mg total) by mouth every 8 (eight) hours as needed for nausea.   polyethylene glycol (MIRALAX  / GLYCOLAX ) 17 g packet Take 17 g by mouth 2 (two) times daily.   prenatal vitamin w/FE, FA (PRENATAL 1 + 1) 27-1 MG TABS tablet Take 1 tablet by mouth daily at 12 noon.   pantoprazole  (PROTONIX ) 40 MG tablet Take 1 tablet (40 mg total) by mouth daily. (Patient not taking: Reported on 09/20/2024)   No facility-administered encounter medications on file as of 09/20/2024.    Past Medical History:  Diagnosis Date   Asthma    Migraine with aura 2014   NO ESTROGEN   Seizures (HCC)    last seizure 2017    Past Surgical History:  Procedure Laterality Date   DILATION AND EVACUATION N/A 10/01/2020   Procedure: DILATATION AND EVACUATION;  Surgeon: Nicholaus Burnard CHRISTELLA, MD;  Location: [facility];  Service: Gynecology;  Laterality: N/A;   ESOPHAGEAL DILATION N/A 09/04/2024   Procedure: DILATION, ESOPHAGUS;  Surgeon: Cinderella Deatrice FALCON, MD;  Location[facility];  Service: Endoscopy;  Laterality: N/A;   ESOPHAGOGASTRODUODENOSCOPY N/A 09/04/2024   Procedure: EGD (ESOPHAGOGASTRODUODENOSCOPY);  Surgeon: Cinderella Deatrice FALCON, MD;  Location: [facility];  Service: Endoscopy;  Laterality: N/A;  2:30pm, ASA 1-2   NO PAST SURGERIES      Family  History  Problem Relation Age of Onset   Cancer Mother        ovarian   Diabetes Mother    Stroke Mother    Hypertension Mother    Healthy Father    Heart disease Paternal Aunt    Irritable bowel syndrome Maternal Grandmother    Cancer Paternal Grandmother        breast   Heart disease Paternal Grandmother     Social History   Socioeconomic History   Marital status: Significant Other    Spouse name: Not on file   Number of children: Not on file   Years of education: Not on file   Highest education level: Not on file  Occupational History   Not on file  Tobacco Use   Smoking status: Never   Smokeless  tobacco: Never  Vaping Use   Vaping status: Never Used  Substance and Sexual Activity   Alcohol use: Not Currently    Comment: occ   Drug use: No   Sexual activity: Yes    Birth control/protection: None  Other Topics Concern   Not on file  Social History Narrative   Not on file   Social Drivers of Health   Financial Resource Strain: Low Risk  (09/20/2024)   Overall Financial Resource Strain (CARDIA)    Difficulty of Paying Living Expenses: Not hard at all  Food Insecurity: No Food Insecurity (09/20/2024)   Hunger Vital Sign    Worried About Running Out of Food in the Last Year: Never true    Ran Out of Food in the Last Year: Never true  Transportation Needs: No Transportation Needs (09/20/2024)   PRAPARE - Administrator, Civil Service (Medical): No    Lack of Transportation (Non-Medical): No  Physical Activity: Sufficiently Active (03/11/2022)   Exercise Vital Sign    Days of Exercise per Week: 5 days    Minutes of Exercise per Session: 90 min  Stress: No Stress Concern Present (09/20/2024)   [employer] of Occupational Health - Occupational Stress Questionnaire    Feeling of Stress: Only a little  Social Connections: Unknown (07/17/2023)   Received from [facility]   Social Network    Social Network: Not on file  Intimate Partner Violence: Unknown (07/17/2023)   Received from [facility]   HITS    Physically Hurt: Not on file    Insult or Talk Down To: Not on file    Threaten Physical Harm: Not on file    Scream or Curse: Not on file    ROS      Objective    BP 92/60   Pulse 77   Temp 98.1 F (36.7 C)   Ht 5' 4 (1.626 m)   Wt 243 lb 6.4 oz (110.4 kg)   SpO2 100%   BMI 41.78 kg/m   Physical Exam Vitals reviewed.  Constitutional:      Appearance: Normal appearance. She is obese.  HENT:     Head: Normocephalic and atraumatic.  Eyes:     Extraocular Movements: Extraocular movements intact.     Conjunctiva/sclera: Conjunctivae  normal.     Pupils: Pupils are equal, round, and reactive to light.  Cardiovascular:     Rate and Rhythm: Normal rate and regular rhythm.     Pulses: Normal pulses.     Heart sounds: Normal heart sounds. No murmur heard. Pulmonary:     Effort: Pulmonary effort is normal. No respiratory distress.     Breath  sounds: Normal breath sounds.  Musculoskeletal:        General: No deformity. Normal range of motion.     Cervical back: Normal range of motion and neck supple.  Skin:    General: Skin is warm and dry.     Capillary Refill: Capillary refill takes less than 2 seconds.  Neurological:     General: No focal deficit present.     Mental Status: She is alert and oriented to person, place, and time.  Psychiatric:        Mood and Affect: Mood normal.        Behavior: Behavior normal.         Assessment & Plan:   Problem List Items Addressed This Visit   None Visit Diagnoses       Encounter for immunization    -  Primary   Relevant Orders   Flu vaccine trivalent PF, 6mos and older(Flulaval,Afluria,Fluarix,Fluzone) (Completed)     Gastroesophageal reflux disease, unspecified whether esophagitis present         Altered bowel function         Other migraine without status migrainosus, not intractable         BMI 40.0-44.9, adult (HCC)       Relevant Orders   Amb Ref to Medical Weight Management     Irregular periods          GERD and altered bowel function - pick up Linzess  and protonix  from pharmacy and start as directed by GI. Follow up with GI as scheduled.  Migraines - patient reports that she called her neurologist and they said that she was still a patient of theirs and that she is going to schedule an appointment with them.    BMI 41 - placed referral to weight management.   History of irregular periods-continue follow-ups with OB/GYN as recommended by them.  Return in about 6 weeks (around 11/01/2024) for annual physical.   Oneil LELON Severin, FNP

## 2024-09-29 ENCOUNTER — Ambulatory Visit: Admitting: Family Medicine

## 2024-10-02 ENCOUNTER — Ambulatory Visit: Admitting: Nurse Practitioner

## 2024-10-02 ENCOUNTER — Other Ambulatory Visit

## 2024-10-04 ENCOUNTER — Other Ambulatory Visit

## 2024-10-05 ENCOUNTER — Other Ambulatory Visit

## 2024-10-12 ENCOUNTER — Ambulatory Visit (INDEPENDENT_AMBULATORY_CARE_PROVIDER_SITE_OTHER): Payer: Self-pay | Admitting: Family Medicine

## 2024-10-12 ENCOUNTER — Encounter: Payer: Self-pay | Admitting: Family Medicine

## 2024-10-12 VITALS — BP 105/70 | HR 86 | Ht 64.0 in | Wt 246.0 lb

## 2024-10-12 DIAGNOSIS — Z13228 Encounter for screening for other metabolic disorders: Secondary | ICD-10-CM | POA: Diagnosis not present

## 2024-10-12 DIAGNOSIS — Z1329 Encounter for screening for other suspected endocrine disorder: Secondary | ICD-10-CM | POA: Diagnosis not present

## 2024-10-12 DIAGNOSIS — Z136 Encounter for screening for cardiovascular disorders: Secondary | ICD-10-CM

## 2024-10-12 DIAGNOSIS — Z0000 Encounter for general adult medical examination without abnormal findings: Secondary | ICD-10-CM

## 2024-10-12 DIAGNOSIS — Z0001 Encounter for general adult medical examination with abnormal findings: Secondary | ICD-10-CM

## 2024-10-12 DIAGNOSIS — Z6841 Body Mass Index (BMI) 40.0 and over, adult: Secondary | ICD-10-CM | POA: Diagnosis not present

## 2024-10-12 DIAGNOSIS — Z130 Encounter for screening for diseases of the blood and blood-forming organs and certain disorders involving the immune mechanism: Secondary | ICD-10-CM

## 2024-10-12 DIAGNOSIS — M25512 Pain in left shoulder: Secondary | ICD-10-CM

## 2024-10-12 DIAGNOSIS — Z1321 Encounter for screening for nutritional disorder: Secondary | ICD-10-CM | POA: Diagnosis not present

## 2024-10-12 DIAGNOSIS — Z23 Encounter for immunization: Secondary | ICD-10-CM

## 2024-10-12 LAB — BAYER DCA HB A1C WAIVED: HB A1C (BAYER DCA - WAIVED): 5.6 % (ref 4.8–5.6)

## 2024-10-12 LAB — LIPID PANEL

## 2024-10-12 NOTE — Patient Instructions (Signed)
 Follow up for shoulder pain if symptoms acutely worsen, no improvement over the next 2-3 days, fever develops, or for any other concerns
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
## 2024-10-13 ENCOUNTER — Ambulatory Visit: Payer: Self-pay | Admitting: Family Medicine

## 2024-10-13 LAB — COMPREHENSIVE METABOLIC PANEL WITH GFR
ALT: 21 IU/L (ref 0–32)
AST: 20 IU/L (ref 0–40)
Albumin: 3.9 g/dL — AB (ref 4.0–5.0)
Alkaline Phosphatase: 48 IU/L (ref 41–116)
BUN/Creatinine Ratio: 16 (ref 9–23)
BUN: 14 mg/dL (ref 6–20)
Bilirubin Total: <0.2 mg/dL (ref 0.0–1.2)
CO2: 23 mmol/L (ref 20–29)
Calcium: 9.1 mg/dL (ref 8.7–10.2)
Chloride: 104 mmol/L (ref 96–106)
Creatinine, Ser: 0.88 mg/dL (ref 0.57–1.00)
Globulin, Total: 2.3 g/dL (ref 1.5–4.5)
Glucose: 103 mg/dL — AB (ref 70–99)
Potassium: 4.2 mmol/L (ref 3.5–5.2)
Sodium: 139 mmol/L (ref 134–144)
Total Protein: 6.2 g/dL (ref 6.0–8.5)
eGFR: 91 mL/min/1.73 (ref >59)

## 2024-10-13 LAB — CBC WITH DIFFERENTIAL/PLATELET
Basophils Absolute: 0 x10E3/uL (ref 0.0–0.2)
Basos: 1 %
EOS (ABSOLUTE): 0.2 x10E3/uL (ref 0.0–0.4)
Eos: 3 %
Hematocrit: 41.4 % (ref 34.0–46.6)
Hemoglobin: 12.7 g/dL (ref 11.1–15.9)
Immature Grans (Abs): 0 x10E3/uL (ref 0.0–0.1)
Immature Granulocytes: 0 %
Lymphocytes Absolute: 3 x10E3/uL (ref 0.7–3.1)
Lymphs: 41 %
MCH: 26.7 pg (ref 26.6–33.0)
MCHC: 30.7 g/dL — ABNORMAL LOW (ref 31.5–35.7)
MCV: 87 fL (ref 79–97)
Monocytes Absolute: 0.3 x10E3/uL (ref 0.1–0.9)
Monocytes: 4 %
Neutrophils Absolute: 3.7 x10E3/uL (ref 1.4–7.0)
Neutrophils: 51 %
Platelets: 320 x10E3/uL (ref 150–450)
RBC: 4.76 x10E6/uL (ref 3.77–5.28)
RDW: 14.4 % (ref 11.7–15.4)
WBC: 7.3 x10E3/uL (ref 3.4–10.8)

## 2024-10-13 LAB — TSH: TSH: 1.37 u[IU]/mL (ref 0.450–4.500)

## 2024-10-13 LAB — LIPID PANEL
Cholesterol, Total: 194 mg/dL (ref 100–199)
HDL: 55 mg/dL (ref >39)
LDL CALC COMMENT:: 3.5 ratio (ref 0.0–4.4)
LDL Chol Calc (NIH): 113 mg/dL — AB (ref 0–99)
Triglycerides: 149 mg/dL (ref 0–149)
VLDL Cholesterol Cal: 26 mg/dL (ref 5–40)

## 2024-10-13 LAB — T4, FREE: Free T4: 0.96 ng/dL (ref 0.82–1.77)

## 2024-10-16 ENCOUNTER — Ambulatory Visit

## 2024-10-31 ENCOUNTER — Institutional Professional Consult (permissible substitution) (INDEPENDENT_AMBULATORY_CARE_PROVIDER_SITE_OTHER): Admitting: Nurse Practitioner

## 2024-11-01 ENCOUNTER — Ambulatory Visit: Admitting: Family Medicine

## 2024-11-01 ENCOUNTER — Encounter: Payer: Self-pay | Admitting: Family Medicine

## 2024-11-07 ENCOUNTER — Ambulatory Visit: Admitting: Family Medicine

## 2024-12-04 ENCOUNTER — Institutional Professional Consult (permissible substitution) (INDEPENDENT_AMBULATORY_CARE_PROVIDER_SITE_OTHER): Admitting: Nurse Practitioner

## 2024-12-14 ENCOUNTER — Encounter: Payer: Self-pay | Admitting: Women's Health

## 2024-12-14 ENCOUNTER — Other Ambulatory Visit (HOSPITAL_COMMUNITY)
Admission: RE | Admit: 2024-12-14 | Discharge: 2024-12-14 | Disposition: A | Source: Ambulatory Visit | Attending: Women's Health | Admitting: Women's Health

## 2024-12-14 ENCOUNTER — Ambulatory Visit: Admitting: Women's Health

## 2024-12-14 VITALS — BP 115/75 | HR 79 | Ht 64.0 in | Wt 248.0 lb

## 2024-12-14 DIAGNOSIS — N921 Excessive and frequent menstruation with irregular cycle: Secondary | ICD-10-CM | POA: Diagnosis present

## 2024-12-14 DIAGNOSIS — Z113 Encounter for screening for infections with a predominantly sexual mode of transmission: Secondary | ICD-10-CM | POA: Diagnosis present

## 2024-12-14 MED ORDER — MEGESTROL ACETATE 40 MG PO TABS: ORAL_TABLET | ORAL | 1 refills | Status: AC

## 2024-12-14 NOTE — Progress Notes (Signed)
 "  GYN VISIT Patient name: Patricia Horton MRN [id number]  Date of birth: 1996/05/08 Chief Complaint:   Menorrhagia (For 3 weeks)  History of Present Illness:   Patricia Horton is a 29 y.o. [phone] African-American female being seen today for heavy period x 3wks, usually only lasts 7d. Changing pad q 1.5hrs, 3 clots-largest a little smaller than golf ball, bad cramps, pain on Rt side. Denies abnormal discharge, itching/odor/irritation.  Partner may have syphilis.     Patient's last menstrual period was 11/25/2024. The current method of family planning is none, ok if gets pregnant Last pap 07/18/24. Results were: NILM w/ HRHPV negative     10/12/2024   12:40 PM 09/20/2024   11:36 AM 03/11/2022   10:04 AM 11/25/2021    2:11 PM 06/22/2013   12:58 PM  Depression screen PHQ 2/9  Decreased Interest 2 0 0 2 2  Down, Depressed, Hopeless 0 0 2 1 0  PHQ - 2 Score 2 0 2 3 2   Altered sleeping 1 0 0 2 1  Tired, decreased energy 2 0 2 3 2   Change in appetite 0 0 0 2 0  Feeling bad or failure about yourself  0 0 1 1 0  Trouble concentrating 0 0 0 0 0  Moving slowly or fidgety/restless 0 0 0 0 0  Suicidal thoughts 0 0 0 0 0   PHQ-9 Score 5 0  5  11  5    Difficult doing work/chores Somewhat difficult Not difficult at all        Data saved with a previous flowsheet row definition        10/12/2024   12:40 PM 09/20/2024   11:37 AM 03/11/2022   10:04 AM 11/25/2021    2:11 PM  GAD 7 : Generalized Anxiety Score  Nervous, Anxious, on Edge 0 0 0 1  Control/stop worrying 0 0 2 0  Worry too much - different things 0 0 2 0  Trouble relaxing 1 0 1 2  Restless 1 0 0 2  Easily annoyed or irritable 1 0 3 1  Afraid - awful might happen 0 0 0 0  Total GAD 7 Score 3 0 8 6  Anxiety Difficulty Somewhat difficult Not difficult at all       Review of Systems:   Pertinent items are noted in HPI Denies fever/chills, dizziness, headaches, visual disturbances, fatigue, shortness of breath, chest pain,  abdominal pain, vomiting, abnormal vaginal discharge/itching/odor/irritation, problems with periods, bowel movements, urination, or intercourse unless otherwise stated above.  Pertinent History Reviewed:  Reviewed past medical,surgical, social, obstetrical and family history.  Reviewed problem list, medications and allergies. Physical Assessment:   Vitals:   12/14/24 1404  BP: 115/75  Pulse: 79  Weight: 248 lb (112.5 kg)  Height: 5' 4 (1.626 m)  Body mass index is 42.57 kg/m.       Physical Examination:   General appearance: alert, well appearing, and in no distress  Mental status: alert, oriented to person, place, and time  Skin: warm & dry   Cardiovascular: normal heart rate noted  Respiratory: normal respiratory effort, no distress  Abdomen: soft, non-tender   Pelvic: VULVA: normal appearing vulva with no masses, tenderness or lesions, VAGINA: normal appearing vagina with normal color and discharge, no lesions, mod amt menstrual blood CERVIX: normal appearing cervix without discharge or lesions, UTERUS: uterus is normal size, shape, consistency and nontender, ADNEXA: normal adnexa in size, nontender and no masses  Extremities: no edema  Chaperone: Peggy Dones  No results found for this or any previous visit (from the past 24 hours).  Assessment & Plan:  1) Menorrhagia w/ irregular cycle w/ pain Rt side> x3wks, rx megace , send CV swab, get pelvic u/s  2) STD screen> CV swab, HIV, RPR (possible exposure)  Meds:  Meds ordered this encounter  Medications   megestrol  (MEGACE ) 40 MG tablet    Sig: 3x5d, 2x5d, then 1 daily to help control vaginal bleeding. Stop taking when bleeding stops.    Dispense:  45 tablet    Refill:  1    Orders Placed This Encounter  Procedures   US  PELVIS (TRANSABDOMINAL ONLY)   US  PELVIS TRANSVAGINAL NON-OB (TV ONLY)   HIV Antibody (routine testing w rflx)   RPR W/RFLX TO RPR TITER, TREPONEMAL AB, SCREEN AND DIAGNOSIS    Return for 1st  available gyn u/s and f/u after.  Suzen JONELLE Fetters CNM, [facility] 12/14/2024 2:48 PM  "

## 2024-12-18 ENCOUNTER — Ambulatory Visit: Payer: Self-pay | Admitting: Women's Health

## 2024-12-18 LAB — CERVICOVAGINAL ANCILLARY ONLY
Bacterial Vaginitis (gardnerella): POSITIVE — AB
Candida Glabrata: NEGATIVE
Candida Vaginitis: POSITIVE — AB
Chlamydia: NEGATIVE
Comment: NEGATIVE
Comment: NEGATIVE
Comment: NEGATIVE
Comment: NEGATIVE
Comment: NEGATIVE
Comment: NORMAL
Neisseria Gonorrhea: NEGATIVE
Trichomonas: NEGATIVE

## 2024-12-18 MED ORDER — METRONIDAZOLE 500 MG PO TABS: 500.0000 mg | ORAL_TABLET | Freq: Two times a day (BID) | ORAL | 0 refills | Status: AC

## 2024-12-18 MED ORDER — FLUCONAZOLE 150 MG PO TABS: 150.0000 mg | ORAL_TABLET | Freq: Once | ORAL | 0 refills | Status: AC

## 2024-12-19 ENCOUNTER — Encounter: Payer: Self-pay | Admitting: Women's Health

## 2024-12-22 ENCOUNTER — Encounter: Payer: Self-pay | Admitting: *Deleted

## 2024-12-23 LAB — HIV ANTIBODY (ROUTINE TESTING W REFLEX): HIV Screen 4th Generation wRfx: NONREACTIVE

## 2024-12-23 LAB — SYPHILIS: RPR W/REFLEX TO RPR TITER AND TREPONEMAL ANTIBODIES, TRADITIONAL SCREENING AND DIAGNOSIS ALGORITHM: RPR Ser Ql: NONREACTIVE

## 2024-12-25 ENCOUNTER — Encounter: Payer: Self-pay | Admitting: *Deleted

## 2024-12-25 ENCOUNTER — Other Ambulatory Visit: Admitting: Radiology

## 2024-12-25 ENCOUNTER — Ambulatory Visit: Admitting: Women's Health

## 2024-12-26 ENCOUNTER — Encounter: Payer: Self-pay | Admitting: Women's Health

## 2024-12-28 ENCOUNTER — Other Ambulatory Visit: Admitting: Radiology

## 2024-12-28 ENCOUNTER — Ambulatory Visit: Admitting: Women's Health

## 2025-01-03 ENCOUNTER — Institutional Professional Consult (permissible substitution) (INDEPENDENT_AMBULATORY_CARE_PROVIDER_SITE_OTHER): Admitting: Nurse Practitioner

## 2025-01-29 ENCOUNTER — Institutional Professional Consult (permissible substitution) (INDEPENDENT_AMBULATORY_CARE_PROVIDER_SITE_OTHER): Admitting: Nurse Practitioner
